# Patient Record
Sex: Female | Born: 1960
Health system: Southern US, Community
[De-identification: ages and names within clinical notes are randomized; demographics above are authoritative.]

## PROBLEM LIST (undated history)

## (undated) DIAGNOSIS — E041 Nontoxic single thyroid nodule: Secondary | ICD-10-CM

## (undated) DIAGNOSIS — M1990 Unspecified osteoarthritis, unspecified site: Secondary | ICD-10-CM

## (undated) DIAGNOSIS — F32A Depression, unspecified: Secondary | ICD-10-CM

## (undated) DIAGNOSIS — F329 Major depressive disorder, single episode, unspecified: Secondary | ICD-10-CM

## (undated) DIAGNOSIS — M069 Rheumatoid arthritis, unspecified: Secondary | ICD-10-CM

## (undated) HISTORY — PX: OTHER SURGICAL HISTORY: SHX169

## (undated) HISTORY — PX: TUBAL LIGATION: SHX77

## (undated) HISTORY — DX: Rheumatoid arthritis, unspecified: M06.9

## (undated) HISTORY — DX: Depression, unspecified: F32.A

## (undated) HISTORY — DX: Unspecified osteoarthritis, unspecified site: M19.90

## (undated) HISTORY — DX: Nontoxic single thyroid nodule: E04.1

## (undated) HISTORY — DX: Major depressive disorder, single episode, unspecified: F32.9

---

## 1998-12-23 ENCOUNTER — Emergency Department (HOSPITAL_COMMUNITY): Admission: EM | Admit: 1998-12-23 | Discharge: 1998-12-23 | Payer: Self-pay | Admitting: *Deleted

## 2002-08-03 ENCOUNTER — Emergency Department (HOSPITAL_COMMUNITY): Admission: EM | Admit: 2002-08-03 | Discharge: 2002-08-03 | Payer: Self-pay | Admitting: Emergency Medicine

## 2002-11-03 ENCOUNTER — Encounter: Payer: Self-pay | Admitting: Rheumatology

## 2002-11-03 ENCOUNTER — Encounter: Admission: RE | Admit: 2002-11-03 | Discharge: 2002-11-03 | Payer: Self-pay | Admitting: Rheumatology

## 2004-03-13 ENCOUNTER — Encounter: Admission: RE | Admit: 2004-03-13 | Discharge: 2004-03-13 | Payer: Self-pay | Admitting: Family Medicine

## 2004-08-24 ENCOUNTER — Ambulatory Visit: Payer: Self-pay | Admitting: Obstetrics and Gynecology

## 2005-04-01 ENCOUNTER — Encounter: Admission: RE | Admit: 2005-04-01 | Discharge: 2005-04-01 | Payer: Self-pay | Admitting: Family Medicine

## 2006-04-23 ENCOUNTER — Encounter: Admission: RE | Admit: 2006-04-23 | Discharge: 2006-04-23 | Payer: Self-pay | Admitting: Family Medicine

## 2006-10-19 ENCOUNTER — Emergency Department (HOSPITAL_COMMUNITY): Admission: EM | Admit: 2006-10-19 | Discharge: 2006-10-19 | Payer: Self-pay | Admitting: Emergency Medicine

## 2007-02-20 ENCOUNTER — Other Ambulatory Visit: Admission: RE | Admit: 2007-02-20 | Discharge: 2007-02-20 | Payer: Self-pay | Admitting: Family Medicine

## 2010-04-13 ENCOUNTER — Emergency Department (HOSPITAL_COMMUNITY): Payer: 59

## 2010-04-13 ENCOUNTER — Emergency Department (HOSPITAL_COMMUNITY)
Admission: EM | Admit: 2010-04-13 | Discharge: 2010-04-14 | Disposition: A | Discharge status: Home / Self Care | Payer: 59 | Attending: Emergency Medicine | Admitting: Emergency Medicine

## 2010-04-13 DIAGNOSIS — M069 Rheumatoid arthritis, unspecified: Secondary | ICD-10-CM | POA: Insufficient documentation

## 2010-04-13 DIAGNOSIS — R51 Headache: Secondary | ICD-10-CM | POA: Insufficient documentation

## 2010-04-13 DIAGNOSIS — H53149 Visual discomfort, unspecified: Secondary | ICD-10-CM | POA: Insufficient documentation

## 2010-04-13 DIAGNOSIS — M329 Systemic lupus erythematosus, unspecified: Secondary | ICD-10-CM | POA: Insufficient documentation

## 2010-07-14 ENCOUNTER — Emergency Department (INDEPENDENT_AMBULATORY_CARE_PROVIDER_SITE_OTHER): Payer: 59

## 2010-07-14 ENCOUNTER — Emergency Department (HOSPITAL_BASED_OUTPATIENT_CLINIC_OR_DEPARTMENT_OTHER)
Admission: EM | Admit: 2010-07-14 | Discharge: 2010-07-14 | Disposition: A | Discharge status: Home / Self Care | Payer: 59 | Attending: Emergency Medicine | Admitting: Emergency Medicine

## 2010-07-14 DIAGNOSIS — N83209 Unspecified ovarian cyst, unspecified side: Secondary | ICD-10-CM | POA: Insufficient documentation

## 2010-07-14 DIAGNOSIS — R1031 Right lower quadrant pain: Secondary | ICD-10-CM | POA: Insufficient documentation

## 2010-07-14 DIAGNOSIS — D259 Leiomyoma of uterus, unspecified: Secondary | ICD-10-CM | POA: Insufficient documentation

## 2010-07-14 DIAGNOSIS — Z8739 Personal history of other diseases of the musculoskeletal system and connective tissue: Secondary | ICD-10-CM | POA: Insufficient documentation

## 2010-07-14 DIAGNOSIS — R350 Frequency of micturition: Secondary | ICD-10-CM

## 2010-07-14 LAB — URINALYSIS, ROUTINE W REFLEX MICROSCOPIC
Glucose, UA: NEGATIVE mg/dL
Nitrite: NEGATIVE
Specific Gravity, Urine: 1.013 (ref 1.005–1.030)
pH: 7 (ref 5.0–8.0)

## 2010-07-15 LAB — URINE CULTURE
Colony Count: NO GROWTH
Culture  Setup Time: 201205052341

## 2010-07-23 ENCOUNTER — Ambulatory Visit (HOSPITAL_COMMUNITY)
Admission: RE | Admit: 2010-07-23 | Discharge: 2010-07-23 | Disposition: A | Discharge status: Home / Self Care | Payer: 59 | Source: Ambulatory Visit | Attending: Obstetrics and Gynecology | Admitting: Obstetrics and Gynecology

## 2010-07-23 ENCOUNTER — Other Ambulatory Visit (HOSPITAL_COMMUNITY): Payer: Self-pay | Admitting: Obstetrics and Gynecology

## 2010-07-23 ENCOUNTER — Other Ambulatory Visit (HOSPITAL_COMMUNITY): Payer: 59

## 2010-07-23 DIAGNOSIS — N83201 Unspecified ovarian cyst, right side: Secondary | ICD-10-CM

## 2010-07-23 DIAGNOSIS — N949 Unspecified condition associated with female genital organs and menstrual cycle: Secondary | ICD-10-CM | POA: Insufficient documentation

## 2010-07-23 DIAGNOSIS — N840 Polyp of corpus uteri: Secondary | ICD-10-CM | POA: Insufficient documentation

## 2010-07-23 DIAGNOSIS — D259 Leiomyoma of uterus, unspecified: Secondary | ICD-10-CM | POA: Insufficient documentation

## 2010-07-23 DIAGNOSIS — N83209 Unspecified ovarian cyst, unspecified side: Secondary | ICD-10-CM | POA: Insufficient documentation

## 2011-06-27 ENCOUNTER — Ambulatory Visit
Admission: RE | Admit: 2011-06-27 | Discharge: 2011-06-27 | Disposition: A | Discharge status: Home / Self Care | Payer: 59 | Source: Ambulatory Visit | Attending: Family Medicine | Admitting: Family Medicine

## 2011-06-27 ENCOUNTER — Other Ambulatory Visit: Payer: Self-pay | Admitting: Family Medicine

## 2011-06-27 DIAGNOSIS — R222 Localized swelling, mass and lump, trunk: Secondary | ICD-10-CM

## 2012-07-24 ENCOUNTER — Other Ambulatory Visit: Payer: Self-pay | Admitting: Family Medicine

## 2012-07-24 ENCOUNTER — Other Ambulatory Visit (HOSPITAL_COMMUNITY)
Admission: RE | Admit: 2012-07-24 | Discharge: 2012-07-24 | Disposition: A | Discharge status: Home / Self Care | Payer: 59 | Source: Ambulatory Visit | Attending: Family Medicine | Admitting: Family Medicine

## 2012-07-24 DIAGNOSIS — Z124 Encounter for screening for malignant neoplasm of cervix: Secondary | ICD-10-CM | POA: Insufficient documentation

## 2013-01-25 ENCOUNTER — Emergency Department (HOSPITAL_COMMUNITY)
Admission: EM | Admit: 2013-01-25 | Discharge: 2013-01-25 | Disposition: A | Discharge status: Home / Self Care | Payer: 59 | Attending: Emergency Medicine | Admitting: Emergency Medicine

## 2013-01-25 ENCOUNTER — Encounter (HOSPITAL_COMMUNITY): Payer: Self-pay | Admitting: Emergency Medicine

## 2013-01-25 ENCOUNTER — Emergency Department (HOSPITAL_COMMUNITY): Payer: 59

## 2013-01-25 DIAGNOSIS — F17200 Nicotine dependence, unspecified, uncomplicated: Secondary | ICD-10-CM | POA: Insufficient documentation

## 2013-01-25 DIAGNOSIS — M5412 Radiculopathy, cervical region: Secondary | ICD-10-CM

## 2013-01-25 DIAGNOSIS — Z79899 Other long term (current) drug therapy: Secondary | ICD-10-CM | POA: Insufficient documentation

## 2013-01-25 DIAGNOSIS — H538 Other visual disturbances: Secondary | ICD-10-CM | POA: Insufficient documentation

## 2013-01-25 DIAGNOSIS — M25519 Pain in unspecified shoulder: Secondary | ICD-10-CM | POA: Insufficient documentation

## 2013-01-25 LAB — CBC
HCT: 36.5 % (ref 36.0–46.0)
Hemoglobin: 12.7 g/dL (ref 12.0–15.0)
MCH: 31.7 pg (ref 26.0–34.0)
MCV: 91 fL (ref 78.0–100.0)
RDW: 13.5 % (ref 11.5–15.5)
WBC: 4 10*3/uL (ref 4.0–10.5)

## 2013-01-25 LAB — COMPREHENSIVE METABOLIC PANEL
Alkaline Phosphatase: 54 U/L (ref 39–117)
CO2: 24 mEq/L (ref 19–32)
Calcium: 9.5 mg/dL (ref 8.4–10.5)
Creatinine, Ser: 0.7 mg/dL (ref 0.50–1.10)
GFR calc Af Amer: >90 mL/min (ref >90)
GFR calc non Af Amer: >90 mL/min (ref >90)
Glucose, Bld: 82 mg/dL (ref 70–99)
Total Bilirubin: 0.3 mg/dL (ref 0.3–1.2)

## 2013-01-25 LAB — POCT I-STAT TROPONIN I: Troponin i, poc: 0.01 ng/mL (ref 0.00–0.08)

## 2013-01-25 LAB — DIFFERENTIAL
Basophils Absolute: 0.1 10*3/uL (ref 0.0–0.1)
Eosinophils Relative: 6 % — ABNORMAL HIGH (ref 0–5)
Lymphocytes Relative: 46 % (ref 12–46)
Lymphs Abs: 1.8 10*3/uL (ref 0.7–4.0)
Monocytes Absolute: 0.4 10*3/uL (ref 0.1–1.0)
Monocytes Relative: 9 % (ref 3–12)
Neutro Abs: 1.5 10*3/uL — ABNORMAL LOW (ref 1.7–7.7)

## 2013-01-25 LAB — APTT: aPTT: 36 seconds (ref 24–37)

## 2013-01-25 MED ORDER — HYDROCODONE-ACETAMINOPHEN 5-325 MG PO TABS: 1.0000 | ORAL_TABLET | ORAL | Status: DC | PRN

## 2013-01-25 MED ORDER — CYCLOBENZAPRINE HCL 10 MG PO TABS: 10.0000 mg | ORAL_TABLET | Freq: Two times a day (BID) | ORAL | Status: DC | PRN

## 2013-01-25 MED ORDER — PREDNISONE 20 MG PO TABS: ORAL_TABLET | ORAL | Status: DC

## 2013-01-25 NOTE — ED Provider Notes (Signed)
CSN: [id number]     Arrival date & time 01/25/13  1342 History   First MD Initiated Contact with Patient 01/25/13 1759     Chief Complaint  Patient presents with  . Numbness  . Visual Field Change   (Consider location/radiation/quality/duration/timing/severity/associated sxs/prior Treatment) HPI Comments: Patient presents to the ER for evaluation numbness, tingling in the left arm with pain in the neck and shoulder region. Patient reports that symptoms began 2 weeks ago, but in the last 2 days have worsened. Patient reports that symptoms are worse when she lies on her side. Pain, numbness and tingling worsens when she turns her head to the left. She denies any injury. She has not had chest pain or shortness of breath. Patient denies headache. She did notice that she has blurred vision today. Reports diffuse blurring of her vision without laterality. She has not noticed any symptoms in the legs.   History reviewed. No pertinent past medical history. Past Surgical History  Procedure Laterality Date  . Tubal ligation     No family history on file. History  Substance Use Topics  . Smoking status: Current Every Day Smoker  . Smokeless tobacco: Not on file  . Alcohol Use: Yes     Comment: occ   OB History   Grav Para Term Preterm Abortions TAB SAB Ect Mult Living                 Review of Systems  Eyes: Positive for visual disturbance.  Musculoskeletal: Positive for arthralgias and neck pain.  Neurological: Positive for numbness. Negative for dizziness, weakness and headaches.  All other systems reviewed and are negative.    Allergies  Review of patient's allergies indicates no known allergies.  Home Medications   Current Outpatient Rx  Name  Route  Sig  Dispense  Refill  . B Complex CAPS   Oral   Take 1 capsule by mouth daily.         . celecoxib (CELEBREX) 200 MG capsule   Oral   Take 200 mg by mouth 2 (two) times daily as needed for mild pain.         .  Cholecalciferol (VITAMIN D PO)   Oral   Take 1 tablet by mouth daily.         Marland Kitchen leflunomide (ARAVA) 20 MG tablet   Oral   Take 20 mg by mouth daily.         . Multiple Vitamin (MULTIVITAMIN WITH MINERALS) TABS tablet   Oral   Take 1 tablet by mouth daily.         Marland Kitchen sulfaSALAzine (AZULFIDINE) 500 MG tablet   Oral   Take 1,000 mg by mouth 2 (two) times daily.         . cyclobenzaprine (FLEXERIL) 10 MG tablet   Oral   Take 1 tablet (10 mg total) by mouth 2 (two) times daily as needed for muscle spasms.   20 tablet   0   . HYDROcodone-acetaminophen (NORCO/VICODIN) 5-325 MG per tablet   Oral   Take 1-2 tablets by mouth every 4 (four) hours as needed.   20 tablet   0   . predniSONE (DELTASONE) 20 MG tablet      3 tabs po daily x 3 days, then 2 tabs x 3 days, then 1.5 tabs x 3 days, then 1 tab x 3 days, then 0.5 tabs x 3 days   27 tablet   0    BP 135/89  Pulse 62  Temp(Src) 97.8 F (36.6 C) (Oral)  Resp 16  Wt 175 lb (79.379 kg)  SpO2 100% Physical Exam  Constitutional: She is oriented to person, place, and time. She appears well-developed and well-nourished. No distress.  HENT:  Head: Normocephalic and atraumatic.  Right Ear: Hearing normal.  Left Ear: Hearing normal.  Nose: Nose normal.  Mouth/Throat: Oropharynx is clear and moist and mucous membranes are normal.  Eyes: Conjunctivae and EOM are normal. Pupils are equal, round, and reactive to light.  Neck: Normal range of motion. Neck supple.  Cardiovascular: Regular rhythm, S1 normal and S2 normal.  Exam reveals no gallop and no friction rub.   No murmur heard. Pulmonary/Chest: Effort normal and breath sounds normal. No respiratory distress. She exhibits no tenderness.  Abdominal: Soft. Normal appearance and bowel sounds are normal. There is no hepatosplenomegaly. There is no tenderness. There is no rebound, no guarding, no tenderness at McBurney's point and negative Murphy's sign. No hernia.    Musculoskeletal: Normal range of motion.       Cervical back: She exhibits tenderness. She exhibits no bony tenderness.       Back:  Left-sided paraspinal cervical muscle tenderness with increased symptoms when turning head to the left  Neurological: She is alert and oriented to person, place, and time. She has normal strength. No cranial nerve deficit or sensory deficit. Coordination normal. GCS eye subscore is 4. GCS verbal subscore is 5. GCS motor subscore is 6.  Reflex Scores:      Tricep reflexes are 2+ on the right side and 2+ on the left side.      Bicep reflexes are 2+ on the right side and 2+ on the left side.      Brachioradialis reflexes are 2+ on the right side and 2+ on the left side. Normal finger to nose.  Skin: Skin is warm, dry and intact. No rash noted. No cyanosis.  Psychiatric: She has a normal mood and affect. Her speech is normal and behavior is normal. Thought content normal.    ED Course  Procedures (including critical care time) Labs Review Labs Reviewed  DIFFERENTIAL - Abnormal; Notable for the following:    Neutrophils Relative % 38 (*)    Neutro Abs 1.5 (*)    Eosinophils Relative 6 (*)    All other components within normal limits  PROTIME-INR  APTT  CBC  COMPREHENSIVE METABOLIC PANEL  TROPONIN I  POCT I-STAT TROPONIN I   Imaging Review Ct Head (brain) Wo Contrast  01/25/2013   CLINICAL DATA:  Numbness in the left arm.  Blurred vision.  EXAM: CT HEAD WITHOUT CONTRAST  TECHNIQUE: Contiguous axial images were obtained from the base of the skull through the vertex without intravenous contrast.  COMPARISON:  04/13/2010  FINDINGS: Normal ventricles. No parenchymal masses or mass effect. No areas of abnormal parenchymal attenuation.  No extra-axial masses or abnormal fluid collections.  No intracranial hemorrhage.  No skull lesion. Visualized sinuses and mastoid air cells are clear.  IMPRESSION: Normal unenhanced CT scan of brain.   Electronically Signed    By: Amie Portland M.D.   On: 01/25/2013 15:09    EKG Interpretation    Date/Time:  Monday January 25 2013 13:47:37 EST Ventricular Rate:  66 PR Interval:  184 QRS Duration: 84 QT Interval:  418 QTC Calculation: 438 R Axis:   87 Text Interpretation:  Normal sinus rhythm Normal ECG            MDM  1. Cervical radiculopathy    Patient presents to the ER for evaluation of pain in the left neck and shoulder area with numbness and tingling in the left arm. Patient has normal neurologic function. She has normal sensation, motor function. Reflexes are normal. Symptoms are reproducible when she turns her head to the left. She has slight increased pain with movement of the left shoulder as well. Symptoms are consistent with cervical radiculopathy. Head CT is unremarkable. Cardiac workup also was performed and is negative. There is no concern for acute coronary syndrome based on her presentation and workup. Likewise, no concern for a central nervous system abnormality or stroke.  Patient does not have any deficit which would warrant emergent MRI. She'll be treated symptomatically for cervical radiculopathy, will schedule followup with her doctor in one week for further evaluation. She was given return instructions.  The patient is alert clear etiology. She does not have any focality or unilaterality. There is no field cut. Patient reports that her blurring has progressively improved with a course of the day. She does not have any evidence of hyperglycemia and no history of diabetes.    Gilda Crease, MD 01/25/13 [id number]

## 2013-01-25 NOTE — ED Notes (Signed)
Pt states on Friday started having numbness to left arm and and top of her feet are burning.  Pt states woke up this am with blurred vision out of both eyes.  Pt denies diabetes.

## 2013-01-25 NOTE — ED Notes (Signed)
Pt brought to POD E at 17:00.  St"s she has been having pain and numbness in left arm.  Pt alert and oriented x's 3.

## 2015-09-04 ENCOUNTER — Other Ambulatory Visit (HOSPITAL_COMMUNITY)
Admission: RE | Admit: 2015-09-04 | Discharge: 2015-09-04 | Disposition: A | Discharge status: Home / Self Care | Payer: 59 | Source: Ambulatory Visit | Attending: Family Medicine | Admitting: Family Medicine

## 2015-09-04 ENCOUNTER — Other Ambulatory Visit: Payer: Self-pay | Admitting: Family Medicine

## 2015-09-04 DIAGNOSIS — Z124 Encounter for screening for malignant neoplasm of cervix: Secondary | ICD-10-CM | POA: Insufficient documentation

## 2015-09-06 LAB — CYTOLOGY - PAP

## 2016-02-06 DIAGNOSIS — Z8619 Personal history of other infectious and parasitic diseases: Secondary | ICD-10-CM | POA: Insufficient documentation

## 2016-02-06 DIAGNOSIS — Z79899 Other long term (current) drug therapy: Secondary | ICD-10-CM | POA: Insufficient documentation

## 2016-02-06 DIAGNOSIS — M0579 Rheumatoid arthritis with rheumatoid factor of multiple sites without organ or systems involvement: Secondary | ICD-10-CM | POA: Insufficient documentation

## 2016-02-06 DIAGNOSIS — R7611 Nonspecific reaction to tuberculin skin test without active tuberculosis: Secondary | ICD-10-CM | POA: Insufficient documentation

## 2016-02-06 DIAGNOSIS — M19041 Primary osteoarthritis, right hand: Secondary | ICD-10-CM | POA: Insufficient documentation

## 2016-02-06 DIAGNOSIS — M170 Bilateral primary osteoarthritis of knee: Secondary | ICD-10-CM | POA: Insufficient documentation

## 2016-02-06 DIAGNOSIS — M0630 Rheumatoid nodule, unspecified site: Secondary | ICD-10-CM | POA: Insufficient documentation

## 2016-02-06 DIAGNOSIS — L930 Discoid lupus erythematosus: Secondary | ICD-10-CM | POA: Insufficient documentation

## 2016-02-06 DIAGNOSIS — M19042 Primary osteoarthritis, left hand: Secondary | ICD-10-CM | POA: Insufficient documentation

## 2016-02-06 NOTE — Progress Notes (Signed)
Office Visit Note  Patient: Samantha Hughes             Date of Birth: [birth year]           MRN: [id number]             PCP: Osborne Casco, MD Referring: Kelton Pillar, MD Visit Date: 02/07/2016 Occupation: Material planner and buyer    Subjective:  Right foot pain   History of Present Illness: Samantha Hughes is a 55 y.o.right handed female with sero positive rheumatoid arthritis. She states she's been doing fairly well except she is occasional discomfort in her right first MTP joint of her foot. There is no history of joint swelling only discomfort in her right first MTP joint which is not described as severe. She uses topical Voltaren gel which helps it.   Activities of Daily Living:  Patient reports morning stiffness for 0 minute.   Patient Denies nocturnal pain.  Difficulty dressing/grooming: Denies Difficulty climbing stairs: Denies Difficulty getting out of chair: Denies Difficulty using hands for taps, buttons, cutlery, and/or writing: Denies   Review of Systems  Constitutional: Negative for fatigue, night sweats, weight gain, weight loss and weakness.  HENT: Negative for mouth sores, trouble swallowing, trouble swallowing, mouth dryness and nose dryness.   Eyes: Positive for dryness. Negative for pain, redness and visual disturbance.  Respiratory: Negative for cough, shortness of breath and difficulty breathing.   Cardiovascular: Negative for chest pain, palpitations, hypertension, irregular heartbeat and swelling in legs/feet.  Gastrointestinal: Positive for constipation. Negative for blood in stool and diarrhea.  Endocrine: Negative for increased urination.  Genitourinary: Negative for vaginal dryness.  Musculoskeletal: Positive for arthralgias and joint pain. Negative for joint swelling, myalgias, muscle weakness, morning stiffness, muscle tenderness and myalgias.  Skin: Negative for color change, rash, hair loss, skin tightness, ulcers and sensitivity to  sunlight.  Allergic/Immunologic: Negative for susceptible to infections.  Neurological: Negative for dizziness, memory loss and night sweats.  Hematological: Negative for swollen glands.  Psychiatric/Behavioral: Negative for depressed mood and sleep disturbance. The patient is not nervous/anxious.     PMFS History:  Patient Active Problem List   Diagnosis Date Noted  . Rheumatoid arthritis with rheumatoid factor of multiple sites without organ or systems involvement ([place]) 02/06/2016  . Discoid lupus 02/06/2016  . Primary osteoarthritis of both hands 02/06/2016  . Primary osteoarthritis of both knees 02/06/2016  . Rheumatoid nodulosis ([place]) 02/06/2016  . High risk medication use 02/06/2016  . Positive PPD, treated 02/06/2016    Past Medical History:  Diagnosis Date  . Depression   . Osteoarthritis   . Rheumatoid arthritis ([place])     Family History  Problem Relation Age of Onset  . Diabetes Mother   . Hypertension Mother   . Diabetes Sister   . Diabetes Maternal Grandmother   . Cancer Maternal Grandfather    Past Surgical History:  Procedure Laterality Date  . eye lift    . TUBAL LIGATION     Social History   Social History Narrative  . No narrative on file     Objective: Vital Signs: BP 112/78 (BP Location: Left Arm, Patient Position: Sitting, Cuff Size: Large)   Pulse 80   Ht _0  (1.676 m)   Wt 177 lb (80.3 kg)   BMI 28.57 kg/m    Physical Exam  Constitutional: She is oriented to person, place, and time. She appears well-developed and well-nourished.  HENT:  Head: Normocephalic and atraumatic.  Eyes: Conjunctivae  and EOM are normal.  Neck: Normal range of motion.  Cardiovascular: Normal rate, regular rhythm, normal heart sounds and intact distal pulses.   Pulmonary/Chest: Effort normal and breath sounds normal.  Abdominal: Soft. Bowel sounds are normal.  Lymphadenopathy:    She has no cervical adenopathy.  Neurological: She is alert and oriented to  person, place, and time.  Skin: Skin is warm and dry. Capillary refill takes less than 2 seconds.  A small palpable nodule over her left elbow  Psychiatric: She has a normal mood and affect. Her behavior is normal.  Nursing note and vitals reviewed.    Musculoskeletal Exam: C-spine, thoracic, lumbar spine good range of motion, shoulder joints, wrist joints, elbow joints, MCPs PIPs DIPs with good range of motion with no synovitis. Hip joints, knee joints, ankle joints, MTPs were good range of motion with no synovitis. Fibromyalgia tender points all absent.  CDAI Exam: CDAI Homunculus Exam:   Joint Counts:  CDAI Tender Joint count: 0 CDAI Swollen Joint count: 0  Global Assessments:  Patient Global Assessment: 2 Provider Global Assessment: 2  CDAI Calculated Score: 4    Investigation: Findings:  December 2003: rheumatoid factor 138, positive CCP. , Hepatitis panel negative, HLA-B27 negative   Positive ANA 1:160 homogeneous.      CBC and CMP 12/12/15      Imaging: No results found.  Speciality Comments: No specialty comments available.    Procedures:  No procedures performed Allergies: Arava [leflunomide]; Plaquenil [hydroxychloroquine sulfate]; and Methotrexate derivatives   Assessment / Plan:     Visit Diagnoses: Primary osteoarthritis of both hands  Rheumatoid arthritis with rheumatoid factor of multiple sites without organ or systems involvement ([place]) - RA.  Positive rheumatoid factor, CCP and history of nodulosis.  She still have a small nodule palpable over her left elbow. She is no synovitis on examination she is doing very well on sulfasalazine which she is tolerating well.  Rheumatoid nodulosis (HCC)  High risk medication use -on Azulfidine EN 500 mg 2 tablets by mouth twice a day. We'll check labs today and then every 3 months to monitor for drug toxicity. Plan: CBC with Differential/Platelet, COMPLETE METABOLIC PANEL WITH GFR, CBC with Differential/Platelet,  COMPLETE METABOLIC PANEL WITH GFR. I will refill her medications today.  Positive PPD, treated - With INH in the past  Discoid lupus - Positive ANA, and she is not have any active rash in a long time.  Primary osteoarthritis of both knees: She is minimal discomfort weight loss diet and exercise was discussed.  Smoker : Smoking cessation was discussed.  Association of rheumatoid arthritis with increased risk of heart disease was discussed. I've advised her to maintain her blood pressure, cholesterol, ideal weight. Need for regular exercise was also emphasized.   Orders: Orders Placed This Encounter  Procedures  . CBC with Differential/Platelet  . COMPLETE METABOLIC PANEL WITH GFR  . CBC with Differential/Platelet  . COMPLETE METABOLIC PANEL WITH GFR   Meds ordered this encounter  Medications  . sulfaSALAzine (AZULFIDINE) 500 MG EC tablet    Sig: Take 2 tablets (1,000 mg total) by mouth 2 (two) times daily.    Dispense:  360 tablet    Refill:  0    Face-to-face time spent with patient was 30 minutes. 50% of time was spent in counseling and coordination of care.  Follow-Up Instructions: Return in about 5 months (around 07/07/2016) for Rheumatoid arthritis.   Bo Merino, MD

## 2016-02-07 ENCOUNTER — Ambulatory Visit (INDEPENDENT_AMBULATORY_CARE_PROVIDER_SITE_OTHER): Payer: 59 | Admitting: Rheumatology

## 2016-02-07 ENCOUNTER — Encounter: Payer: Self-pay | Admitting: Rheumatology

## 2016-02-07 ENCOUNTER — Other Ambulatory Visit: Payer: Self-pay | Admitting: Radiology

## 2016-02-07 VITALS — BP 112/78 | HR 80 | Ht 66.0 in | Wt 177.0 lb

## 2016-02-07 DIAGNOSIS — Z79899 Other long term (current) drug therapy: Secondary | ICD-10-CM

## 2016-02-07 DIAGNOSIS — M0579 Rheumatoid arthritis with rheumatoid factor of multiple sites without organ or systems involvement: Secondary | ICD-10-CM | POA: Diagnosis not present

## 2016-02-07 DIAGNOSIS — M170 Bilateral primary osteoarthritis of knee: Secondary | ICD-10-CM | POA: Diagnosis not present

## 2016-02-07 DIAGNOSIS — F17200 Nicotine dependence, unspecified, uncomplicated: Secondary | ICD-10-CM

## 2016-02-07 DIAGNOSIS — M19041 Primary osteoarthritis, right hand: Secondary | ICD-10-CM

## 2016-02-07 DIAGNOSIS — R7611 Nonspecific reaction to tuberculin skin test without active tuberculosis: Secondary | ICD-10-CM | POA: Diagnosis not present

## 2016-02-07 DIAGNOSIS — M19042 Primary osteoarthritis, left hand: Secondary | ICD-10-CM

## 2016-02-07 DIAGNOSIS — M0630 Rheumatoid nodule, unspecified site: Secondary | ICD-10-CM

## 2016-02-07 DIAGNOSIS — L930 Discoid lupus erythematosus: Secondary | ICD-10-CM | POA: Diagnosis not present

## 2016-02-07 LAB — CBC WITH DIFFERENTIAL/PLATELET
BASOS ABS: 44 {cells}/uL (ref 0–200)
Basophils Relative: 1 %
EOS PCT: 9 %
Eosinophils Absolute: 396 cells/uL (ref 15–500)
HEMATOCRIT: 40 % (ref 35.0–45.0)
Hemoglobin: 12.6 g/dL (ref 11.7–15.5)
LYMPHS ABS: 1760 {cells}/uL (ref 850–3900)
Lymphocytes Relative: 40 %
MCH: 30.2 pg (ref 27.0–33.0)
MCHC: 31.5 g/dL — AB (ref 32.0–36.0)
MCV: 95.9 fL (ref 80.0–100.0)
MONO ABS: 396 {cells}/uL (ref 200–950)
MPV: 10.7 fL (ref 7.5–12.5)
Monocytes Relative: 9 %
NEUTROS ABS: 1804 {cells}/uL (ref 1500–7800)
Neutrophils Relative %: 41 %
Platelets: 229 10*3/uL (ref 140–400)
RBC: 4.17 MIL/uL (ref 3.80–5.10)
RDW: 14.1 % (ref 11.0–15.0)
WBC: 4.4 10*3/uL (ref 3.8–10.8)

## 2016-02-07 LAB — COMPLETE METABOLIC PANEL WITH GFR
ALBUMIN: 4 g/dL (ref 3.6–5.1)
ALK PHOS: 51 U/L (ref 33–130)
ALT: 9 U/L (ref 6–29)
AST: 17 U/L (ref 10–35)
BUN: 15 mg/dL (ref 7–25)
CALCIUM: 9.4 mg/dL (ref 8.6–10.4)
CO2: 29 mmol/L (ref 20–31)
Chloride: 108 mmol/L (ref 98–110)
Creat: 0.89 mg/dL (ref 0.50–1.05)
GFR, Est African American: 85 mL/min (ref >=60)
GFR, Est Non African American: 74 mL/min (ref >=60)
Glucose, Bld: 54 mg/dL — ABNORMAL LOW (ref 65–99)
POTASSIUM: 3.8 mmol/L (ref 3.5–5.3)
Sodium: 142 mmol/L (ref 135–146)
Total Bilirubin: 0.4 mg/dL (ref 0.2–1.2)
Total Protein: 6.8 g/dL (ref 6.1–8.1)

## 2016-02-07 MED ORDER — SULFASALAZINE 500 MG PO TBEC: 1000.0000 mg | DELAYED_RELEASE_TABLET | Freq: Two times a day (BID) | ORAL | 0 refills | Status: DC

## 2016-02-07 NOTE — Progress Notes (Signed)
Rheumatology Medication Review by a Pharmacist Does the patient feel that his/her medications are working for him/her?  Yes Has the patient been experiencing any side effects to the medications prescribed?  No Does the patient have any problems obtaining medications?  No  Issues to address at subsequent visits: None   Pharmacist comments:  Samantha Hughes is a pleasant 55 yo F who presents for follow up of her rheumatoid arthritis.  Patient is currently taking sulfasalazine DR 1000mg  twice daily.  She had most recent standing labs on 12/11/15 which were normal.  She will be due for standing labs again in January 2018.  Will place standing lab orders.  Patient denies any medication related questions at this time.    Elisabeth Most, Pharm.D., BCPS Clinical Pharmacist Pager: [phone] Phone: [phone] 02/07/2016 8:33 AM

## 2016-02-07 NOTE — Patient Instructions (Signed)
Standing Labs We placed an order today for your standing lab work.    Please come back and get your standing labs in January 2018 and every 3 months.    We have open lab Monday through Friday from 8:30-11:30 AM and 1-4 PM at the office of Dr. Shaili Deveshwar/Naitik Panwala, PA.   The office is located at [address], [address], [place], [place] [address] No appointment is necessary.   Labs are drawn by [facility].  You may receive a bill from [facility] for your lab work.     

## 2016-02-08 NOTE — Progress Notes (Signed)
Normal labs.

## 2016-02-21 ENCOUNTER — Other Ambulatory Visit: Payer: Self-pay | Admitting: Rheumatology

## 2016-02-21 NOTE — Telephone Encounter (Signed)
ok 

## 2016-02-21 NOTE — Telephone Encounter (Signed)
Last Visit: 02/07/16 Next Visit: 07/08/16 Labs: 02/07/16 WNL  Okay to refill Celebrex?

## 2016-06-14 DIAGNOSIS — J069 Acute upper respiratory infection, unspecified: Secondary | ICD-10-CM | POA: Diagnosis not present

## 2016-06-27 DIAGNOSIS — L658 Other specified nonscarring hair loss: Secondary | ICD-10-CM | POA: Insufficient documentation

## 2016-07-08 ENCOUNTER — Ambulatory Visit: Payer: 59 | Admitting: Rheumatology

## 2016-07-08 DIAGNOSIS — F17200 Nicotine dependence, unspecified, uncomplicated: Secondary | ICD-10-CM | POA: Insufficient documentation

## 2016-07-08 NOTE — Progress Notes (Signed)
Office Visit Note  Patient: Samantha Hughes             Date of Birth: 1961/02/12           MRN: [id number]             PCP: Osborne Casco, MD Referring: Kelton Pillar, MD Visit Date: 07/12/2016 Occupation: @GUAROCC @    Subjective:  Bilateral knee pain   History of Present Illness: Samantha Hughes is a 56 y.o. female with a history of rheumatoid arthritis, osteoarthritis, and discoid rash.  Patient states she is having knee pain.  Patient states she has been taking sulfasalazine daily and does not need refills. Patient denies joint swelling.  Denies rashes.  She states she occasionally has mouth ulcers.  Patient states that she had flare 6 days ago and lasted 3 days and has resolved. She describes during the time she had pain across her shoulders and lower back. She mostly rested and the symptoms resolved.  She states her lower back and knees were painful.  She also reports muscle aches and stiffness.             Activities of Daily Living:  Patient reports morning stiffness for 2 minutes.   Patient Denies nocturnal pain.  Difficulty dressing/grooming: Denies Difficulty climbing stairs: Denies Difficulty getting out of chair: Denies Difficulty using hands for taps, buttons, cutlery, and/or writing: Denies   Review of Systems  Constitutional: Negative for fatigue, weight gain, weight loss and weakness.  HENT: Positive for mouth sores. Negative for mouth dryness and nose dryness.   Eyes: Positive for dryness.  Respiratory: Negative for cough, shortness of breath and difficulty breathing.   Cardiovascular: Negative for chest pain, palpitations, hypertension, irregular heartbeat and swelling in legs/feet.  Gastrointestinal: Positive for constipation. Negative for diarrhea.  Genitourinary: Negative for painful urination.  Musculoskeletal: Positive for arthralgias, joint pain, myalgias and myalgias. Negative for joint swelling and morning stiffness.  Skin: Negative for color  change, rash, nodules/bumps, redness, ulcers and sensitivity to sunlight.  Psychiatric/Behavioral: Negative for depressed mood and sleep disturbance. The patient is not nervous/anxious.     PMFS History:  Patient Active Problem List   Diagnosis Date Noted  . Smoker 07/08/2016  . Rheumatoid arthritis with rheumatoid factor of multiple sites without organ or systems involvement ([place]) 02/06/2016  . Discoid lupus 02/06/2016  . Primary osteoarthritis of both hands 02/06/2016  . Primary osteoarthritis of both knees 02/06/2016  . Rheumatoid nodulosis ([place]) 02/06/2016  . High risk medication use 02/06/2016  . Positive PPD, treated 02/06/2016    Past Medical History:  Diagnosis Date  . Depression   . Osteoarthritis   . Rheumatoid arthritis ([place])     Family History  Problem Relation Age of Onset  . Diabetes Mother   . Hypertension Mother   . Diabetes Sister   . Diabetes Maternal Grandmother   . Cancer Maternal Grandfather    Past Surgical History:  Procedure Laterality Date  . eye lift    . TUBAL LIGATION     Social History   Social History Narrative  . No narrative on file     Objective: Vital Signs: BP 137/84 (BP Location: Left Arm, Patient Position: Sitting, Cuff Size: Normal)   Pulse 80   Resp 12   Ht 5\' 6"  (1.676 m)   Wt 177 lb (80.3 kg)   BMI 28.57 kg/m    Physical Exam  Constitutional: She is oriented to person, place, and time. She appears well-developed  and well-nourished.  HENT:  Head: Normocephalic and atraumatic.  Eyes: Conjunctivae and EOM are normal.  Neck: Normal range of motion.  Cardiovascular: Normal rate, regular rhythm, normal heart sounds and intact distal pulses.   Pulmonary/Chest: Effort normal and breath sounds normal.  Abdominal: Soft. Bowel sounds are normal.  Lymphadenopathy:    She has no cervical adenopathy.  Neurological: She is alert and oriented to person, place, and time.  Skin: Skin is warm and dry. Capillary refill takes less  than 2 seconds.  Psychiatric: She has a normal mood and affect. Her behavior is normal.  Nursing note and vitals reviewed.    Musculoskeletal Exam: C-spine and thoracic lumbar spine good range of motion. Shoulder joints elbow joints wrist joint MCPs PIPs DIPs with good range of motion. She is some synovial thickening over bilateral second MCP joint. Hip joints knee joints ankles MTPs PIPs with good range of motion with no synovitis.  CDAI Exam: CDAI Homunculus Exam:   Joint Counts:  CDAI Tender Joint count: 0 CDAI Swollen Joint count: 0  Global Assessments:  Patient Global Assessment: 3 Provider Global Assessment: 2  CDAI Calculated Score: 5    Investigation: Findings:  02/07/2016 CBC normal, CMP normal    Imaging: No results found.  Speciality Comments: No specialty comments available.    Procedures:  No procedures performed Allergies: Arava [leflunomide]; Plaquenil [hydroxychloroquine sulfate]; and Methotrexate derivatives   Assessment / Plan:     Visit Diagnoses: Rheumatoid arthritis with rheumatoid factor of multiple sites without organ or systems involvement (HCC) - Positive RF, positive anti-CCP. She had no synovitis on examination she has mild synovial thickening over bilateral second MCP joint. She seems to be doing quite well on sulfasalazine.  Rheumatoid nodulosis (HCC)  High risk medication use - Azulfidine EN 500 mg 2 tablets by mouth twice a day, labs are due. We will check her labs today and then every 3 months to monitor for drug toxicity  Discoid lupus - History of positive ANA. She has not had a  discoid rash and a long time.  Primary osteoarthritis of both hands she had some osteoarthritic changes and a small mucinous cyst over her right first PIP joint. We will observe it for right now.  Primary osteoarthritis of both knees: She does have chronic pain in her knee joints due to underlying osteoarthritis knee joint and muscle strengthening exercises  and weight management was discussed.  Positive PPD, treated - With INH  Smoker    Orders: Orders Placed This Encounter  Procedures  . CBC with Differential/Platelet  . COMPLETE METABOLIC PANEL WITH GFR  . CBC with Differential/Platelet  . COMPLETE METABOLIC PANEL WITH GFR   No orders of the defined types were placed in this encounter.   Face-to-face time spent with patient was 30 minutes. 50% of time was spent in counseling and coordination of care.  Follow-Up Instructions: Return in about 5 months (around 12/12/2016) for Rheumatoid arthritis.   Bo Merino, MD  Note - This record has been created using Editor, commissioning.  Chart creation errors have been sought, but may not always  have been located. Such creation errors do not reflect on  the standard of medical care.

## 2016-07-12 ENCOUNTER — Ambulatory Visit (INDEPENDENT_AMBULATORY_CARE_PROVIDER_SITE_OTHER): Payer: 59 | Admitting: Rheumatology

## 2016-07-12 ENCOUNTER — Encounter: Payer: Self-pay | Admitting: Rheumatology

## 2016-07-12 VITALS — BP 137/84 | HR 80 | Resp 12 | Ht 66.0 in | Wt 177.0 lb

## 2016-07-12 DIAGNOSIS — Z79899 Other long term (current) drug therapy: Secondary | ICD-10-CM

## 2016-07-12 DIAGNOSIS — M0579 Rheumatoid arthritis with rheumatoid factor of multiple sites without organ or systems involvement: Secondary | ICD-10-CM

## 2016-07-12 DIAGNOSIS — M19041 Primary osteoarthritis, right hand: Secondary | ICD-10-CM | POA: Diagnosis not present

## 2016-07-12 DIAGNOSIS — M0630 Rheumatoid nodule, unspecified site: Secondary | ICD-10-CM | POA: Diagnosis not present

## 2016-07-12 DIAGNOSIS — F17200 Nicotine dependence, unspecified, uncomplicated: Secondary | ICD-10-CM | POA: Diagnosis not present

## 2016-07-12 DIAGNOSIS — R7611 Nonspecific reaction to tuberculin skin test without active tuberculosis: Secondary | ICD-10-CM

## 2016-07-12 DIAGNOSIS — M170 Bilateral primary osteoarthritis of knee: Secondary | ICD-10-CM | POA: Diagnosis not present

## 2016-07-12 DIAGNOSIS — L930 Discoid lupus erythematosus: Secondary | ICD-10-CM

## 2016-07-12 DIAGNOSIS — M19042 Primary osteoarthritis, left hand: Secondary | ICD-10-CM | POA: Diagnosis not present

## 2016-07-12 LAB — CBC WITH DIFFERENTIAL/PLATELET
Basophils Absolute: 0 cells/uL (ref 0–200)
Basophils Relative: 0 %
Eosinophils Absolute: 364 cells/uL (ref 15–500)
Eosinophils Relative: 7 %
HCT: 38.4 % (ref 35.0–45.0)
Hemoglobin: 12.6 g/dL (ref 11.7–15.5)
Lymphocytes Relative: 39 %
Lymphs Abs: 2028 cells/uL (ref 850–3900)
MCH: 30.6 pg (ref 27.0–33.0)
MCHC: 32.8 g/dL (ref 32.0–36.0)
MCV: 93.2 fL (ref 80.0–100.0)
MPV: 10.6 fL (ref 7.5–12.5)
Monocytes Absolute: 416 cells/uL (ref 200–950)
Monocytes Relative: 8 %
Neutro Abs: 2392 cells/uL (ref 1500–7800)
Neutrophils Relative %: 46 %
Platelets: 250 10*3/uL (ref 140–400)
RBC: 4.12 MIL/uL (ref 3.80–5.10)
RDW: 14.1 % (ref 11.0–15.0)
WBC: 5.2 10*3/uL (ref 3.8–10.8)

## 2016-07-12 NOTE — Patient Instructions (Signed)
Standing Labs We placed an order today for your standing lab work.    Please come back and get your standing labs in August and every 3 months  We have open lab Monday through Friday from 8:30-11:30 AM and 1:30-4 PM at the office of Dr. Tresa Moore, PA.   The office is located at [address] [place], [place], [place] [address] No appointment is necessary.   Labs are drawn by [facility].  You may receive a bill from [place] for your lab work.

## 2016-07-13 LAB — COMPLETE METABOLIC PANEL WITH GFR
ALT: 12 U/L (ref 6–29)
AST: 18 U/L (ref 10–35)
Albumin: 4 g/dL (ref 3.6–5.1)
Alkaline Phosphatase: 48 U/L (ref 33–130)
BILIRUBIN TOTAL: 0.4 mg/dL (ref 0.2–1.2)
BUN: 15 mg/dL (ref 7–25)
CO2: 28 mmol/L (ref 20–31)
CREATININE: 0.75 mg/dL (ref 0.50–1.05)
Calcium: 9.6 mg/dL (ref 8.6–10.4)
Chloride: 106 mmol/L (ref 98–110)
GFR, Est African American: >89 mL/min (ref >=60)
GFR, Est Non African American: >89 mL/min (ref >=60)
Glucose, Bld: 38 mg/dL — CL (ref 65–99)
Potassium: 5.2 mmol/L (ref 3.5–5.3)
Sodium: 141 mmol/L (ref 135–146)
TOTAL PROTEIN: 6.7 g/dL (ref 6.1–8.1)

## 2016-07-13 NOTE — Progress Notes (Signed)
I spoke with lab most likely an error. Pt is asymptomatic.

## 2016-07-15 ENCOUNTER — Telehealth: Payer: Self-pay | Admitting: Radiology

## 2016-07-15 NOTE — Telephone Encounter (Signed)
I have called patient to advise labs are normal EXCEPT low glucose, have left message for her, this likely is lab error, if she has symptoms of low blood sugar, she should see PCP immediately

## 2016-07-15 NOTE — Telephone Encounter (Signed)
-----   Message from Bo Merino, MD sent at 07/13/2016  9:00 PM EDT ----- I spoke with lab most likely an error. Pt is asymptomatic.

## 2016-07-17 NOTE — Telephone Encounter (Signed)
Attempted to contact the patient and left message for patient to call the office.  

## 2016-07-17 NOTE — Telephone Encounter (Signed)
Spoke with patient and advised her of lab results. Patient states she has been feeling dizzy and light headed. Patient advised she should follow up with PCP as soon as possible. Copy of labs sent to PCP.  Patient states she will call to make an appointment.

## 2016-07-18 DIAGNOSIS — H52223 Regular astigmatism, bilateral: Secondary | ICD-10-CM | POA: Diagnosis not present

## 2016-09-04 ENCOUNTER — Other Ambulatory Visit: Payer: Self-pay | Admitting: Rheumatology

## 2016-09-04 NOTE — Telephone Encounter (Signed)
Last Visit: 07/12/16 Next Visit: 12/13/16 Labs: 07/12/16 normal labs except low glucose.   Okay to refill per Dr. Estanislado Pandy

## 2016-09-18 DIAGNOSIS — Z0000 Encounter for general adult medical examination without abnormal findings: Secondary | ICD-10-CM | POA: Diagnosis not present

## 2016-09-18 DIAGNOSIS — M069 Rheumatoid arthritis, unspecified: Secondary | ICD-10-CM | POA: Diagnosis not present

## 2016-09-18 DIAGNOSIS — N951 Menopausal and female climacteric states: Secondary | ICD-10-CM | POA: Diagnosis not present

## 2016-09-21 DIAGNOSIS — L02412 Cutaneous abscess of left axilla: Secondary | ICD-10-CM | POA: Diagnosis not present

## 2016-09-26 DIAGNOSIS — L658 Other specified nonscarring hair loss: Secondary | ICD-10-CM | POA: Diagnosis not present

## 2016-10-07 ENCOUNTER — Other Ambulatory Visit: Payer: Self-pay | Admitting: *Deleted

## 2016-10-07 DIAGNOSIS — Z79899 Other long term (current) drug therapy: Secondary | ICD-10-CM | POA: Diagnosis not present

## 2016-10-07 LAB — CBC WITH DIFFERENTIAL/PLATELET
BASOS ABS: 50 {cells}/uL (ref 0–200)
Basophils Relative: 1 %
EOS ABS: 450 {cells}/uL (ref 15–500)
Eosinophils Relative: 9 %
HCT: 38.6 % (ref 35.0–45.0)
Hemoglobin: 12.5 g/dL (ref 11.7–15.5)
Lymphocytes Relative: 46 %
Lymphs Abs: 2300 cells/uL (ref 850–3900)
MCH: 30.6 pg (ref 27.0–33.0)
MCHC: 32.4 g/dL (ref 32.0–36.0)
MCV: 94.6 fL (ref 80.0–100.0)
MPV: 10.5 fL (ref 7.5–12.5)
Monocytes Absolute: 300 cells/uL (ref 200–950)
Monocytes Relative: 6 %
Neutro Abs: 1900 cells/uL (ref 1500–7800)
Neutrophils Relative %: 38 %
Platelets: 266 10*3/uL (ref 140–400)
RBC: 4.08 MIL/uL (ref 3.80–5.10)
RDW: 14.3 % (ref 11.0–15.0)
WBC: 5 10*3/uL (ref 3.8–10.8)

## 2016-10-07 LAB — COMPLETE METABOLIC PANEL WITH GFR
ALBUMIN: 4.2 g/dL (ref 3.6–5.1)
ALK PHOS: 50 U/L (ref 33–130)
ALT: 9 U/L (ref 6–29)
AST: 20 U/L (ref 10–35)
BILIRUBIN TOTAL: 0.4 mg/dL (ref 0.2–1.2)
BUN: 14 mg/dL (ref 7–25)
CO2: 25 mmol/L (ref 20–31)
CREATININE: 0.74 mg/dL (ref 0.50–1.05)
Calcium: 9.5 mg/dL (ref 8.6–10.4)
Chloride: 105 mmol/L (ref 98–110)
GFR, Est African American: >89 mL/min (ref >=60)
GLUCOSE: 86 mg/dL (ref 65–99)
Potassium: 4.4 mmol/L (ref 3.5–5.3)
Sodium: 139 mmol/L (ref 135–146)
Total Protein: 6.7 g/dL (ref 6.1–8.1)

## 2016-10-08 NOTE — Progress Notes (Signed)
Within normal limits

## 2016-10-10 ENCOUNTER — Telehealth: Payer: Self-pay | Admitting: Radiology

## 2016-10-10 NOTE — Telephone Encounter (Signed)
I have called patient to advise labs are normal  

## 2016-10-10 NOTE — Telephone Encounter (Signed)
-----   Message from Bo Merino, MD sent at 10/08/2016  1:23 PM EDT ----- Within normal limits

## 2016-10-19 DIAGNOSIS — Z1231 Encounter for screening mammogram for malignant neoplasm of breast: Secondary | ICD-10-CM | POA: Diagnosis not present

## 2016-12-06 NOTE — Progress Notes (Signed)
Office Visit Note  Patient: Samantha Hughes             Date of Birth: 06-28-1960           MRN: [id number]             PCP: Kelton Pillar, MD Referring: Kelton Pillar, MD Visit Date: 12/13/2016 Occupation: @GUAROCC @    Subjective:  Pain in right hand and left knee   History of Present Illness: Samantha Hughes is a 56 y.o. female with history of sero positive rheumatoid arthritis and discoid lupus. She states she has intermittent pain in her right hand and her left knee joint. She's been having some discomfort in her left knee recently but is resolved now.  Activities of Daily Living:  Patient reports morning stiffness for 0 minutes.   Patient Denies nocturnal pain.  Difficulty dressing/grooming: Denies Difficulty climbing stairs: Denies Difficulty getting out of chair: Denies Difficulty using hands for taps, buttons, cutlery, and/or writing: Denies   Review of Systems  Constitutional: Negative.  Negative for fatigue and weakness.  HENT: Negative.  Negative for mouth dryness.   Eyes: Negative.  Negative for dryness.  Respiratory: Negative.  Negative for cough and shortness of breath.   Cardiovascular: Negative.  Negative for chest pain, palpitations, hypertension and swelling in legs/feet.  Gastrointestinal: Positive for constipation. Negative for blood in stool and diarrhea.  Musculoskeletal: Positive for arthralgias, joint pain and joint swelling. Negative for myalgias, muscle weakness, morning stiffness, muscle tenderness and myalgias.  Skin: Negative.  Negative for rash, hair loss and sensitivity to sunlight.  Neurological: Negative.  Negative for dizziness, numbness and headaches.  Psychiatric/Behavioral: Negative for depressed mood and sleep disturbance.    PMFS History:  Patient Active Problem List   Diagnosis Date Noted  . Smoker 07/08/2016  . Rheumatoid arthritis with rheumatoid factor of multiple sites without organ or systems involvement ([place]) 02/06/2016  .  Discoid lupus 02/06/2016  . Primary osteoarthritis of both hands 02/06/2016  . Primary osteoarthritis of both knees 02/06/2016  . 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 High risk medication use 02/06/2016  . Positive PPD, treated 02/06/2016    Past Medical History:  Diagnosis Date  . Depression   . Osteoarthritis   . Rheumatoid arthritis ([employer])     Family History  Problem Relation Age of Onset  . Diabetes Mother   . Hypertension Mother   . Diabetes Sister   . Diabetes Maternal Grandmother   . Cancer Maternal Grandfather    Past Surgical History:  Procedure Laterality Date  . eye lift    . TUBAL LIGATION     Social History   Social History Narrative  . No narrative on file     Objective: Vital Signs: BP 114/76 (BP Location: Left Arm, Patient Position: Sitting, Cuff Size: Normal)   Pulse 79   Ht 5\' 6"  (1.676 m)   Wt 174 lb (78.9 kg)   BMI 28.08 kg/m    Physical Exam  Constitutional: She is oriented to person, place, and time. She appears well-developed and well-nourished.  HENT:  Head: Normocephalic and atraumatic.  Eyes: Conjunctivae and EOM are normal.  Neck: Normal range of motion.  Cardiovascular: Normal rate, regular rhythm, normal heart sounds and intact distal pulses.   Pulmonary/Chest: Effort normal and breath sounds normal.  Abdominal: Soft. Bowel sounds are normal.  Lymphadenopathy:    She has no cervical adenopathy.  Neurological: She is alert and oriented to person, place, and time.  Skin:  Skin is warm and dry. Capillary refill takes less than 2 seconds.  Psychiatric: She has a normal mood and affect. Her behavior is normal.  Nursing note and vitals reviewed.    Musculoskeletal Exam: C-spine and thoracic lumbar spine good range of motion. Shoulder joints elbow joints wrist joints are good range of motion. She has some synovial thickening over her MCP joints but no active synovitis was noted. Hip joints knee joints ankles MTPs PIPs with good range  of motion. She is some discomfort range of motion of her knee joints without any warmth swelling or effusion. No MTP synovitis was noted but she has some tenderness across her MTPs.  CDAI Exam: CDAI Homunculus Exam:   Tenderness:  Right hand: 2nd MCP LLE: tibiofemoral  Joint Counts:  CDAI Tender Joint count: 2 CDAI Swollen Joint count: 0  Global Assessments:  Patient Global Assessment: 2 Provider Global Assessment: 2  CDAI Calculated Score: 6    Investigation: No additional findings.  CBC Latest Ref Rng & Units 10/07/2016 07/12/2016 02/07/2016  WBC 3.8 - 10.8 K/uL 5.0 5.2 4.4  Hemoglobin 11.7 - 15.5 g/dL 12.5 12.6 12.6  Hematocrit 35.0 - 45.0 % 38.6 38.4 40.0  Platelets 140 - 400 K/uL 266 250 229    CMP Latest Ref Rng & Units 10/07/2016 07/12/2016 02/07/2016  Glucose 65 - 99 mg/dL 86 38(LL) 54(L)  BUN 7 - 25 mg/dL 14 15 15   Creatinine 0.50 - 1.05 mg/dL 0.74 0.75 0.89  Sodium 135 - 146 mmol/L 139 141 142  Potassium 3.5 - 5.3 mmol/L 4.4 5.2 3.8  Chloride 98 - 110 mmol/L 105 106 108  CO2 20 - 31 mmol/L 25 28 29   Calcium 8.6 - 10.4 mg/dL 9.5 9.6 9.4  Total Protein 6.1 - 8.1 g/dL 6.7 6.7 6.8  Total Bilirubin 0.2 - 1.2 mg/dL 0.4 0.4 0.4  Alkaline Phos 33 - 130 U/L 50 48 51  AST 10 - 35 U/L 20 18 17   ALT 6 - 29 U/L 9 12 9    Imaging: Xr Foot 2 Views Left  Result Date: 12/13/2016 First MTP narrowing was noted. All other MTPs appear normal. Minimal PIP/DIP narrowing was noted. No erosive changes were noted. Impression: Mild osteoarthritis of the foot.  Xr Foot 2 Views Right  Result Date: 12/13/2016 First MTP narrowing was noted. All other MTPs appear normal. Minimal PIP/DIP narrowing was noted. No erosive changes were noted. Impression: Mild osteoarthritis of the foot.  Xr Hand 2 View Left  Result Date: 12/13/2016 Minimal PIP narrowing was noted. No MCP or intercarpal joint space narrowing was noted. No erosive changes were noted. Mild juxta articular osteopenia was noted.  Impression: These findings are consistent with rheumatoid arthritis and osteoarthritis overlap.  Xr Hand 2 View Right  Result Date: 12/13/2016 Juxta articular osteopenia was noted. First second and third MCP joint space narrowing was noted. No intercarpal joint space narrowing was noted. No erosive changes were noted. Mild PIP/DIP narrowing was noted. Impression: These findings are consistent with rheumatoid arthritis and osteoarthritis overlap.  Xr Knee 3 View Left  Result Date: 12/13/2016 Mild medial compartment narrowing no chondrocalcinosis was noted. Mild patellofemoral narrowing. Impression: Mild osteoarthritis and mild chondromalacia patella.  Xr Knee 3 View Right  Result Date: 12/13/2016 Mild medial compartment narrowing no chondrocalcinosis was noted. Mild patellofemoral narrowing. Impression: Mild osteoarthritis and mild chondromalacia patella.   Speciality Comments: No specialty comments available.    Procedures:  No procedures performed Allergies: Arava [leflunomide]; Plaquenil [hydroxychloroquine sulfate]; and Methotrexate  derivatives   Assessment / Plan:     Visit Diagnoses: Rheumatoid arthritis with rheumatoid factor of multiple sites without organ or systems involvement+RF +CCP  [facility]): Patient continues to have some arthralgias and has some synovial thickening but no active synovitis.  Discoid lupus: She has not had rash multiple years.  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  High risk medication use - Sulfasalazine 500 mg by mouth 2 tablets twice a day, Celebrex 200 mg by mouth twice a day when necessary - Plan: CBC with Differential/Platelet, COMPLETE METABOLIC PANEL WITH GFR, today and then every 3 months. CBC with Differential/Platelet, COMPLETE METABOLIC PANEL WITH GFR  Primary osteoarthritis of both hands - Plan: XR Hand 2 View Right, XR Hand 2 View Left he and  Primary osteoarthritis of both knees - Plan: XR KNEE 3 VIEW RIGHT, XR  KNEE 3 VIEW LEFT  Pain in both feet - Plan: XR Foot 2 Views Right, XR Foot 2 Views Left  Positive PPD, treated - with INH  Smoker : Smoking cessation was discussed.  Association of heart disease with rheumatoid arthritis was discussed. Need to monitor blood pressure, cholesterol, and to exercise 30-60 minutes on daily basis was discussed. Poor dental hygiene can be a predisposing factor for rheumatoid arthritis. Good dental hygiene was discussed.   Orders: Orders Placed This Encounter  Procedures  . XR Hand 2 View Right  . XR Hand 2 View Left  . XR KNEE 3 VIEW RIGHT  . XR KNEE 3 VIEW LEFT  . XR Foot 2 Views Right  . XR Foot 2 Views Left  . CBC with Differential/Platelet  . COMPLETE METABOLIC PANEL WITH GFR  . CBC with Differential/Platelet  . COMPLETE METABOLIC PANEL WITH GFR   No orders of the defined types were placed in this encounter.   Face-to-face time spent with patient was 30 minutes. Greater than 50% of time was spent in counseling and coordination of care.  Follow-Up Instructions: Return in about 5 months (around 05/13/2017) for Rheumatoid arthritis.   Bo Merino, MD  Note - This record has been created using Editor, commissioning.  Chart creation errors have been sought, but may not always  have been located. Such creation errors do not reflect on  the standard of medical care.

## 2016-12-13 ENCOUNTER — Ambulatory Visit (INDEPENDENT_AMBULATORY_CARE_PROVIDER_SITE_OTHER): Payer: 59

## 2016-12-13 ENCOUNTER — Ambulatory Visit (INDEPENDENT_AMBULATORY_CARE_PROVIDER_SITE_OTHER): Payer: 59 | Admitting: Rheumatology

## 2016-12-13 ENCOUNTER — Ambulatory Visit (INDEPENDENT_AMBULATORY_CARE_PROVIDER_SITE_OTHER): Payer: Self-pay

## 2016-12-13 ENCOUNTER — Encounter: Payer: Self-pay | Admitting: Rheumatology

## 2016-12-13 VITALS — BP 114/76 | HR 79 | Ht 66.0 in | Wt 174.0 lb

## 2016-12-13 DIAGNOSIS — M19041 Primary osteoarthritis, right hand: Secondary | ICD-10-CM

## 2016-12-13 DIAGNOSIS — R7611 Nonspecific reaction to tuberculin skin test without active tuberculosis: Secondary | ICD-10-CM | POA: Diagnosis not present

## 2016-12-13 DIAGNOSIS — M19042 Primary osteoarthritis, left hand: Secondary | ICD-10-CM

## 2016-12-13 DIAGNOSIS — M0579 Rheumatoid arthritis with rheumatoid factor of multiple sites without organ or systems involvement: Secondary | ICD-10-CM

## 2016-12-13 DIAGNOSIS — M79671 Pain in right foot: Secondary | ICD-10-CM

## 2016-12-13 DIAGNOSIS — M170 Bilateral primary osteoarthritis of knee: Secondary | ICD-10-CM | POA: Diagnosis not present

## 2016-12-13 DIAGNOSIS — M79672 Pain in left foot: Secondary | ICD-10-CM

## 2016-12-13 DIAGNOSIS — M1712 Unilateral primary osteoarthritis, left knee: Secondary | ICD-10-CM | POA: Diagnosis not present

## 2016-12-13 DIAGNOSIS — L930 Discoid lupus erythematosus: Secondary | ICD-10-CM | POA: Diagnosis not present

## 2016-12-13 DIAGNOSIS — Z79899 Other long term (current) drug therapy: Secondary | ICD-10-CM | POA: Diagnosis not present

## 2016-12-13 DIAGNOSIS — F17200 Nicotine dependence, unspecified, uncomplicated: Secondary | ICD-10-CM | POA: Diagnosis not present

## 2016-12-13 DIAGNOSIS — M1711 Unilateral primary osteoarthritis, right knee: Secondary | ICD-10-CM

## 2016-12-13 DIAGNOSIS — M0630 Rheumatoid nodule, unspecified site: Secondary | ICD-10-CM

## 2016-12-13 LAB — CBC WITH DIFFERENTIAL/PLATELET
Basophils Absolute: 28 cells/uL (ref 0–200)
Basophils Relative: 0.6 %
EOS PCT: 5.4 %
Eosinophils Absolute: 254 cells/uL (ref 15–500)
HCT: 38.4 % (ref 35.0–45.0)
HEMOGLOBIN: 12.7 g/dL (ref 11.7–15.5)
Lymphs Abs: 1720 cells/uL (ref 850–3900)
MCH: 30.5 pg (ref 27.0–33.0)
MCHC: 33.1 g/dL (ref 32.0–36.0)
MCV: 92.3 fL (ref 80.0–100.0)
MONOS PCT: 9.4 %
MPV: 11.1 fL (ref 7.5–12.5)
NEUTROS ABS: 2256 {cells}/uL (ref 1500–7800)
Neutrophils Relative %: 48 %
Platelets: 241 10*3/uL (ref 140–400)
RBC: 4.16 10*6/uL (ref 3.80–5.10)
RDW: 12.8 % (ref 11.0–15.0)
Total Lymphocyte: 36.6 %
WBC mixed population: 442 cells/uL (ref 200–950)
WBC: 4.7 10*3/uL (ref 3.8–10.8)

## 2016-12-13 LAB — COMPLETE METABOLIC PANEL WITH GFR
AG Ratio: 1.5 (calc) (ref 1.0–2.5)
ALT: 9 U/L (ref 6–29)
AST: 16 U/L (ref 10–35)
Albumin: 4.1 g/dL (ref 3.6–5.1)
Alkaline phosphatase (APISO): 53 U/L (ref 33–130)
BUN: 15 mg/dL (ref 7–25)
CO2: 29 mmol/L (ref 20–32)
CREATININE: 0.78 mg/dL (ref 0.50–1.05)
Calcium: 9.8 mg/dL (ref 8.6–10.4)
Chloride: 106 mmol/L (ref 98–110)
GFR, EST AFRICAN AMERICAN: 99 mL/min/{1.73_m2} (ref >=60)
GFR, Est Non African American: 86 mL/min/{1.73_m2} (ref >=60)
GLUCOSE: 69 mg/dL (ref 65–99)
Globulin: 2.8 g/dL (calc) (ref 1.9–3.7)
Potassium: 4.2 mmol/L (ref 3.5–5.3)
Sodium: 142 mmol/L (ref 135–146)
Total Bilirubin: 0.4 mg/dL (ref 0.2–1.2)
Total Protein: 6.9 g/dL (ref 6.1–8.1)

## 2016-12-13 NOTE — Patient Instructions (Addendum)
Standing Labs We placed an order today for your standing lab work.    Please come back and get your standing labs in January and every 3 months  We have open lab Monday through Friday from 8:30-11:30 AM and 1:30-4 PM at the office of Dr. Bo Merino.   The office is located at [address], [place], [place], [place] [address] No appointment is necessary.   Labs are drawn by [facility].  You may receive a bill from [facility] for your lab work. If you have any questions regarding directions or hours of operation,  please call [phone].     Smoking cessation was discussed.  Association of heart disease with rheumatoid arthritis was discussed. Need to monitor blood pressure, cholesterol, and to exercise 30-60 minutes on daily basis was discussed. Poor dental hygiene can be a predisposing factor for rheumatoid arthritis. Good dental hygiene was discussed.

## 2016-12-14 NOTE — Progress Notes (Signed)
WNLs

## 2016-12-18 ENCOUNTER — Telehealth: Payer: Self-pay | Admitting: Radiology

## 2016-12-18 NOTE — Telephone Encounter (Signed)
I have called patient to advise labs are normal  

## 2016-12-18 NOTE — Telephone Encounter (Signed)
-----   Message from Bo Merino, MD sent at 12/14/2016  3:23 PM EDT ----- WNLs

## 2017-01-09 DIAGNOSIS — L658 Other specified nonscarring hair loss: Secondary | ICD-10-CM | POA: Diagnosis not present

## 2017-02-07 DIAGNOSIS — H532 Diplopia: Secondary | ICD-10-CM | POA: Diagnosis not present

## 2017-03-13 ENCOUNTER — Other Ambulatory Visit: Payer: Self-pay | Admitting: Rheumatology

## 2017-03-13 NOTE — Telephone Encounter (Signed)
Last Visit: 12/13/16 Next Visit: 05/16/17 Labs: 12/13/16 WNL  Okay to refill per Dr. Estanislado Pandy

## 2017-04-23 ENCOUNTER — Other Ambulatory Visit: Payer: Self-pay | Admitting: Rheumatology

## 2017-04-23 NOTE — Telephone Encounter (Addendum)
Last Visit: 12/13/16 Next Visit: 05/16/17 Labs: 12/13/16 WNL  Left message to advise patient she is due to update labs  Okay to refill 30 day supply per Dr. Estanislado Pandy

## 2017-04-28 ENCOUNTER — Other Ambulatory Visit: Payer: Self-pay

## 2017-04-28 DIAGNOSIS — Z79899 Other long term (current) drug therapy: Secondary | ICD-10-CM

## 2017-04-28 LAB — COMPLETE METABOLIC PANEL WITH GFR
AG Ratio: 1.5 (calc) (ref 1.0–2.5)
ALKALINE PHOSPHATASE (APISO): 51 U/L (ref 33–130)
ALT: 10 U/L (ref 6–29)
AST: 16 U/L (ref 10–35)
Albumin: 4 g/dL (ref 3.6–5.1)
BUN: 14 mg/dL (ref 7–25)
CALCIUM: 9.5 mg/dL (ref 8.6–10.4)
CO2: 30 mmol/L (ref 20–32)
CREATININE: 0.86 mg/dL (ref 0.50–1.05)
Chloride: 107 mmol/L (ref 98–110)
GFR, EST NON AFRICAN AMERICAN: 76 mL/min/{1.73_m2} (ref >=60)
GFR, Est African American: 88 mL/min/{1.73_m2} (ref >=60)
GLOBULIN: 2.7 g/dL (ref 1.9–3.7)
GLUCOSE: 81 mg/dL (ref 65–99)
Potassium: 4.3 mmol/L (ref 3.5–5.3)
SODIUM: 142 mmol/L (ref 135–146)
Total Bilirubin: 0.3 mg/dL (ref 0.2–1.2)
Total Protein: 6.7 g/dL (ref 6.1–8.1)

## 2017-04-28 LAB — CBC WITH DIFFERENTIAL/PLATELET
BASOS PCT: 0.9 %
Basophils Absolute: 38 cells/uL (ref 0–200)
EOS ABS: 168 {cells}/uL (ref 15–500)
Eosinophils Relative: 4 %
HEMATOCRIT: 37.8 % (ref 35.0–45.0)
HEMOGLOBIN: 12.8 g/dL (ref 11.7–15.5)
LYMPHS ABS: 1961 {cells}/uL (ref 850–3900)
MCH: 30.6 pg (ref 27.0–33.0)
MCHC: 33.9 g/dL (ref 32.0–36.0)
MCV: 90.4 fL (ref 80.0–100.0)
MPV: 10.8 fL (ref 7.5–12.5)
Monocytes Relative: 7.8 %
NEUTROS ABS: 1705 {cells}/uL (ref 1500–7800)
Neutrophils Relative %: 40.6 %
PLATELETS: 254 10*3/uL (ref 140–400)
RBC: 4.18 10*6/uL (ref 3.80–5.10)
RDW: 12.8 % (ref 11.0–15.0)
Total Lymphocyte: 46.7 %
WBC: 4.2 10*3/uL (ref 3.8–10.8)
WBCMIX: 328 {cells}/uL (ref 200–950)

## 2017-05-02 NOTE — Progress Notes (Signed)
Office Visit Note  Patient: Samantha Hughes             Date of Birth: 1960-07-21           MRN: [id number]             PCP: Kelton Pillar, MD Referring: Kelton Pillar, MD Visit Date: 05/16/2017 Occupation: @GUAROCC @    Subjective:  Eye dryness    History of Present Illness: Samantha Hughes is a 57 y.o. female with history of discoid lupus, seropositive rheumatoid arthritis, and osteoarthritis.  The patient states she continues to take sulfasalazine 2 tablets twice daily.  She denies missing any doses recently.  Recent flares of her discoid lupus or rheumatoid arthritis.  She states she has no rashes or lesions at this time.  She states 1 week ago she was diagnosed with scleritis of the left eye.  She has been using eyedrops daily.  She states her symptoms have resolved.  She states she has been having recurrent mouth sores about once a month.  She states the sores resolve on their own quickly.  She denies any sores in her nose.  Patient denies any worsening fatigue, fevers, unintentional weight loss, or swollen lymph nodes.  She states she has been having some scalp tenderness.  She denies any hair loss.  She reports her symptoms of Raynaud's have resolved.  She denies any joint pain or joint swelling at this time.  She denies any joint stiffness.  States she experiences occasional discomfort in her bilateral hands but no swelling.  Knees and feet have been doing good.  She denies any new rheumatoid nodules.  The nodules on her bilateral elbows have resolved.     Activities of Daily Living:  Patient reports morning stiffness for 0 minutes.   Patient Denies nocturnal pain.  Difficulty dressing/grooming: Denies Difficulty climbing stairs: Denies Difficulty getting out of chair: Denies Difficulty using hands for taps, buttons, cutlery, and/or writing: Denies   Review of Systems  Constitutional: Negative for fatigue and weakness.  HENT: Positive for mouth sores. Negative for trouble  swallowing, trouble swallowing, mouth dryness and nose dryness.   Eyes: Positive for dryness. Negative for pain, redness and visual disturbance.  Respiratory: Negative for cough, hemoptysis, shortness of breath and difficulty breathing.   Cardiovascular: Negative for chest pain, palpitations, hypertension, irregular heartbeat and swelling in legs/feet.  Gastrointestinal: Negative for blood in stool, constipation and diarrhea.  Endocrine: Negative for increased urination.  Genitourinary: Negative for painful urination.  Musculoskeletal: Negative for arthralgias, joint pain, joint swelling, myalgias, muscle weakness, morning stiffness, muscle tenderness and myalgias.  Skin: Positive for sensitivity to sunlight. Negative for color change, pallor, rash, hair loss, nodules/bumps, redness, skin tightness and ulcers.  Allergic/Immunologic: Negative for susceptible to infections.  Neurological: Negative for dizziness, numbness and headaches.  Hematological: Negative for swollen glands.  Psychiatric/Behavioral: Negative for depressed mood and sleep disturbance. The patient is not nervous/anxious.     PMFS History:  Patient Active Problem List   Diagnosis Date Noted  . Smoker 07/08/2016  . Rheumatoid arthritis with rheumatoid factor of multiple sites without organ or systems involvement ([place]) 02/06/2016  . Discoid lupus 02/06/2016  . Primary osteoarthritis of both hands 02/06/2016  . Primary osteoarthritis of both knees 02/06/2016  . 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 High risk medication use 02/06/2016  . Positive PPD, treated 02/06/2016    Past Medical History:  Diagnosis Date  . Depression   . Osteoarthritis   . Rheumatoid  arthritis ([place])     Family History  Problem Relation Age of Onset  . Diabetes Mother   . Hypertension Mother   . Diabetes Sister   . Diabetes Maternal Grandmother   . Cancer Maternal Grandfather    Past Surgical History:  Procedure Laterality Date  . eye  lift    . TUBAL LIGATION     Social History   Social History Narrative  . Not on file     Objective: Vital Signs: BP 126/82 (BP Location: Left Arm, Patient Position: Sitting, Cuff Size: Normal)   Pulse 78   Resp 18   Ht 5\' 6"  (1.676 m)   Wt 177 lb 8 oz (80.5 kg)   BMI 28.65 kg/m    Physical Exam  Constitutional: She is oriented to person, place, and time. She appears well-developed and well-nourished.  HENT:  Head: Normocephalic and atraumatic.  Eyes: Conjunctivae and EOM are normal.  Neck: Normal range of motion.  Cardiovascular: Normal rate, regular rhythm, normal heart sounds and intact distal pulses.  Pulmonary/Chest: Effort normal and breath sounds normal.  Abdominal: Soft. Bowel sounds are normal.  Lymphadenopathy:    She has no cervical adenopathy.  Neurological: She is alert and oriented to person, place, and time.  Skin: Skin is warm and dry. Capillary refill takes less than 2 seconds.  Psychiatric: She has a normal mood and affect. Her behavior is normal.  Nursing note and vitals reviewed.    Musculoskeletal Exam: C-spine, thoracic spine, lumbar spine good range of motion.  No midline spinal tenderness.  No SI joint tenderness.  Shoulder joints, elbow joints, wrist joints,, MCPs, PIPs, DIPs good range of motion with no synovitis.  No nodulosis noted.  Hip joints, knee joints, ankle joints, MTPs, PIPs, DIPs good range of motion with no synovitis.  No warmth or effusion of bilateral knees.  Bilateral knee crepitus.  No trochanteric bursa tenderness.  CDAI Exam: CDAI Homunculus Exam:   Joint Counts:  CDAI Tender Joint count: 0 CDAI Swollen Joint count: 0  Global Assessments:  Patient Global Assessment: 4 Provider Global Assessment: 3  CDAI Calculated Score: 7    Investigation: No additional findings. CBC Latest Ref Rng & Units 04/28/2017 12/13/2016 10/07/2016  WBC 3.8 - 10.8 Thousand/uL 4.2 4.7 5.0  Hemoglobin 11.7 - 15.5 g/dL 12.8 12.7 12.5  Hematocrit  35.0 - 45.0 % 37.8 38.4 38.6  Platelets 140 - 400 Thousand/uL 254 241 266   CMP Latest Ref Rng & Units 04/28/2017 12/13/2016 10/07/2016  Glucose 65 - 99 mg/dL 81 69 86  BUN 7 - 25 mg/dL 14 15 14   Creatinine 0.50 - 1.05 mg/dL 0.86 0.78 0.74  Sodium 135 - 146 mmol/L 142 142 139  Potassium 3.5 - 5.3 mmol/L 4.3 4.2 4.4  Chloride 98 - 110 mmol/L 107 106 105  CO2 20 - 32 mmol/L 30 29 25   Calcium 8.6 - 10.4 mg/dL 9.5 9.8 9.5  Total Protein 6.1 - 8.1 g/dL 6.7 6.9 6.7  Total Bilirubin 0.2 - 1.2 mg/dL 0.3 0.4 0.4  Alkaline Phos 33 - 130 U/L - - 50  AST 10 - 35 U/L 16 16 20   ALT 6 - 29 U/L 10 9 9     Imaging: No results found.  Speciality Comments: No specialty comments available.    Procedures:  No procedures performed Allergies: Arava [leflunomide]; Plaquenil [hydroxychloroquine sulfate]; and Methotrexate derivatives   Assessment / Plan:     Visit Diagnoses: Discoid lupus: She has not had any recent flares  of her discoid lupus.  She has no rashes or lesions at this time.  She no longer follows up with a dermatologist.  She states she continues to take sulfasalazine 2 tablets twice daily.  Rheumatoid arthritis with rheumatoid factor of multiple sites without organ or systems involvement [facility]): She has no synovitis or tenderness on exam.  She has synovial thickening of MCPs but no synovitis.  She has had no recent flares. she experiences occasional arthralgias but no swelling. She will continue on sulfasalazine 2 tablets twice daily.    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  Nodules on bilateral elbows have resolved.   High risk medication use - SSZ 1,000 mg, 2 times daily. Standing orders for CBC and CMP are in place.  Her labs on 04/28/17 were WNL.   Primary osteoarthritis of both hands: She has no tenderness on exam.  Joint protection and muscle strengthening were discussed.  Primary osteoarthritis of both knees: No warmth or effusion on exam.  She has bilateral knee  crepitus.  Is no discomfort at this time.  Primary osteoarthritis of both feet: No discomfort at this time.  Importance of proper fitting shoes was discussed.  Positive PPD, treated - INH  Smoker    Orders: No orders of the defined types were placed in this encounter.  No orders of the defined types were placed in this encounter.     Follow-Up Instructions: Return in about 5 months (around 10/16/2017) for Rheumatoid arthritis, Discoid lupus.   Ofilia Neas, PA-C  I examined and evaluated the patient with Hazel Sams PA. The plan of care was discussed as noted above.  Bo Merino, MD Note - This record has been created using Editor, commissioning.  Chart creation errors have been sought, but may not always  have been located. Such creation errors do not reflect on  the standard of medical care.

## 2017-05-09 DIAGNOSIS — H15112 Episcleritis periodica fugax, left eye: Secondary | ICD-10-CM | POA: Diagnosis not present

## 2017-05-16 ENCOUNTER — Encounter: Payer: Self-pay | Admitting: Physician Assistant

## 2017-05-16 ENCOUNTER — Ambulatory Visit: Payer: 59 | Admitting: Physician Assistant

## 2017-05-16 VITALS — BP 126/82 | HR 78 | Resp 18 | Ht 66.0 in | Wt 177.5 lb

## 2017-05-16 DIAGNOSIS — M19071 Primary osteoarthritis, right ankle and foot: Secondary | ICD-10-CM | POA: Diagnosis not present

## 2017-05-16 DIAGNOSIS — Z79899 Other long term (current) drug therapy: Secondary | ICD-10-CM | POA: Diagnosis not present

## 2017-05-16 DIAGNOSIS — M19072 Primary osteoarthritis, left ankle and foot: Secondary | ICD-10-CM

## 2017-05-16 DIAGNOSIS — M0630 Rheumatoid nodule, unspecified site: Secondary | ICD-10-CM

## 2017-05-16 DIAGNOSIS — L930 Discoid lupus erythematosus: Secondary | ICD-10-CM

## 2017-05-16 DIAGNOSIS — F17200 Nicotine dependence, unspecified, uncomplicated: Secondary | ICD-10-CM | POA: Diagnosis not present

## 2017-05-16 DIAGNOSIS — M0579 Rheumatoid arthritis with rheumatoid factor of multiple sites without organ or systems involvement: Secondary | ICD-10-CM

## 2017-05-16 DIAGNOSIS — M19042 Primary osteoarthritis, left hand: Secondary | ICD-10-CM

## 2017-05-16 DIAGNOSIS — R7611 Nonspecific reaction to tuberculin skin test without active tuberculosis: Secondary | ICD-10-CM

## 2017-05-16 DIAGNOSIS — M170 Bilateral primary osteoarthritis of knee: Secondary | ICD-10-CM | POA: Diagnosis not present

## 2017-05-16 DIAGNOSIS — M19041 Primary osteoarthritis, right hand: Secondary | ICD-10-CM

## 2017-05-16 NOTE — Patient Instructions (Signed)
Standing Labs We placed an order today for your standing lab work.    Please come back and get your standing labs in May and every 3 months  We have open lab Monday through Friday from 8:30-11:30 AM and 1:30-4 PM at the office of Dr. Shaili Deveshwar.   The office is located at [address], [address], [place], [place] [address] No appointment is necessary.   Labs are drawn by [facility].  You may receive a bill from [facility] for your lab work. If you have any questions regarding directions or hours of operation,  please call [phone].    

## 2017-06-02 ENCOUNTER — Other Ambulatory Visit: Payer: Self-pay | Admitting: Rheumatology

## 2017-06-03 NOTE — Telephone Encounter (Signed)
Last Visit: 05/16/17 Next Visit: 10/17/17 Labs: 04/28/17 WNL  Okay to refill per Dr. Estanislado Pandy

## 2017-10-03 DIAGNOSIS — Z1159 Encounter for screening for other viral diseases: Secondary | ICD-10-CM | POA: Diagnosis not present

## 2017-10-03 DIAGNOSIS — Z0000 Encounter for general adult medical examination without abnormal findings: Secondary | ICD-10-CM | POA: Diagnosis not present

## 2017-10-03 DIAGNOSIS — Z136 Encounter for screening for cardiovascular disorders: Secondary | ICD-10-CM | POA: Diagnosis not present

## 2017-10-06 NOTE — Progress Notes (Signed)
Office Visit Note  Patient: Samantha Hughes             Date of Birth: [birth year]           MRN: [id number]             PCP: Kelton Pillar, MD Referring: Kelton Pillar, MD Visit Date: 10/17/2017 Occupation: @GUAROCC @  Subjective:  Medication monitoring   History of Present Illness: Samantha Hughes is a 57 y.o. female with history of discoid, osteoarthritis, and seropositive rheumatoid arthritis. She is taking SSZ 1,000 mg BID.  She is been tolerating sulfasalazine and has not missed any doses recently.  She denies any recent rheumatoid arthritis flares.  She states occasionally she has had some discomfort in her left knee joint and notices some swelling but she denies any pain or swelling at this time.  She denies any joint pain or joint swelling.  Denies any joint stiffness.  She has not noticed any rheumatoid nodules.  She states that she takes Celebrex 200 mg 1 tablet daily.  She states that she is having increased pain she will take Celebrex 200 mg twice daily.  She denies any recent rashes or discoid lesions.  She denies any sores in her mouth or nose.  Denies any swollen lymph nodes.  Denies any symptoms of [place].  Denies any fatigue or recent low-grade fevers.   Activities of Daily Living:  Patient reports morning stiffness for 0 none.   Patient Denies nocturnal pain.  Difficulty dressing/grooming: Denies Difficulty climbing stairs: Denies Difficulty getting out of chair: Denies Difficulty using hands for taps, buttons, cutlery, and/or writing: Denies  Review of Systems  Constitutional: Negative for activity change and fatigue.  HENT: Negative for mouth sores, mouth dryness and nose dryness.   Eyes: Positive for itching and dryness. Negative for pain and visual disturbance.  Respiratory: Negative for cough, hemoptysis, shortness of breath and difficulty breathing.   Cardiovascular: Negative for chest pain, palpitations, hypertension and swelling in legs/feet.    Gastrointestinal: Positive for constipation. Negative for blood in stool and diarrhea.  Endocrine: Negative for increased urination.  Genitourinary: Negative for difficulty urinating and painful urination.  Musculoskeletal: Positive for arthralgias, joint pain and joint swelling. Negative for myalgias, muscle weakness, morning stiffness, muscle tenderness and myalgias.  Skin: Negative for color change, pallor, rash, hair loss, nodules/bumps, skin tightness, ulcers and sensitivity to sunlight.  Allergic/Immunologic: Negative for susceptible to infections.  Neurological: Negative for dizziness, numbness, headaches and weakness.  Hematological: Negative for swollen glands.  Psychiatric/Behavioral: Negative for depressed mood and sleep disturbance. The patient is not nervous/anxious.     PMFS History:  Patient Active Problem List   Diagnosis Date Noted  . Smoker 07/08/2016  . Rheumatoid arthritis with rheumatoid factor of multiple sites without organ or systems involvement ([place]) 02/06/2016  . Discoid lupus 02/06/2016  . Primary osteoarthritis of both hands 02/06/2016  . Primary osteoarthritis of both knees 02/06/2016  . 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 High risk medication use 02/06/2016  . Positive PPD, treated 02/06/2016    Past Medical History:  Diagnosis Date  . Depression   . Osteoarthritis   . Rheumatoid arthritis ([place])     Family History  Problem Relation Age of Onset  . Diabetes Mother   . Hypertension Mother   . Diabetes Sister   . Diabetes Maternal Grandmother   . Cancer Maternal Grandfather    Past Surgical History:  Procedure Laterality Date  . eye lift    .  TUBAL LIGATION     Social History   Social History Narrative  . Not on file    Objective: Vital Signs: BP 130/80 (BP Location: Left Arm, Patient Position: Sitting, Cuff Size: Normal)   Pulse 78   Resp 14   Ht 5\' 6"  (1.676 m)   Wt 180 lb 9.6 oz (81.9 kg)   BMI 29.15 kg/m    Physical Exam   Constitutional: She is oriented to person, place, and time. She appears well-developed and well-nourished.  HENT:  Head: Normocephalic and atraumatic.  Eyes: Conjunctivae and EOM are normal.  Neck: Normal range of motion.  Cardiovascular: Normal rate, regular rhythm, normal heart sounds and intact distal pulses.  Pulmonary/Chest: Effort normal and breath sounds normal.  Abdominal: Soft. Bowel sounds are normal.  Lymphadenopathy:    She has no cervical adenopathy.  Neurological: She is alert and oriented to person, place, and time.  Skin: Skin is warm and dry. Capillary refill takes less than 2 seconds.  No discoid lesions noted.  Psychiatric: She has a normal mood and affect. Her behavior is normal.  Nursing note and vitals reviewed.    Musculoskeletal Exam: C-spine, thoracic spine, lumbar spine good range of motion.  No midline spinal tenderness.  No SI joint tenderness.  Shoulder joints, elbow joints, wrist joints, MCPs, PIPs, DIPs good range of motion with no synovitis.  She has MCP synovial thickening.  Hip joints, knee joints, ankle joints, MTPs, PIPs, DIPs good range of motion with no synovitis.  No warmth or effusion of bilateral knee joints.  She has bilateral knee crepitus.  She is no tenderness of trochanteric bursa bilaterally.  No rheumatoid nodules noted.   CDAI Exam: CDAI Homunculus Exam:   Joint Counts:  CDAI Tender Joint count: 0 CDAI Swollen Joint count: 0  Global Assessments:  Patient Global Assessment: 2 Provider Global Assessment: 2  CDAI Calculated Score: 4   Investigation: No additional findings.  Imaging: No results found.  Recent Labs: Lab Results  Component Value Date   WBC 4.2 04/28/2017   HGB 12.8 04/28/2017   PLT 254 04/28/2017   NA 142 04/28/2017   K 4.3 04/28/2017   CL 107 04/28/2017   CO2 30 04/28/2017   GLUCOSE 81 04/28/2017   BUN 14 04/28/2017   CREATININE 0.86 04/28/2017   BILITOT 0.3 04/28/2017   ALKPHOS 50 10/07/2016   AST 16  04/28/2017   ALT 10 04/28/2017   PROT 6.7 04/28/2017   ALBUMIN 4.2 10/07/2016   CALCIUM 9.5 04/28/2017   GFRAA 88 04/28/2017    Speciality Comments: No specialty comments available.  Procedures:  No procedures performed Allergies: Arava [leflunomide]; Plaquenil [hydroxychloroquine sulfate]; and Methotrexate derivatives   Assessment / Plan:     Visit Diagnoses: Discoid lupus: She has not had any recent flares of discoid lupus.  She has not had any recent rashes.  She continues to wear sunscreen on a daily basis.  She will continue taking Sulfasalazine 500 mg 2 tablets BID.  A refill was sent to the pharmacy.   Rheumatoid arthritis with rheumatoid factor of multiple sites without organ or systems involvement [facility]): She has no synovitis or tenderness on exam.  She has no palpable MCPs but no synovitis or ulnar deviation was noted.  She has not had any recent rheumatoid arthritis flares.  She has no joint pain or joint swelling at this time.  She occasionally has bilateral knee pain which typically resolves on its own.  She takes Celebrex 1 tablet  by mouth daily.  If she expenses increased knee pain she will take 2 tablets daily.  She will continue on sulfasalazine 500 mg 2 tablets twice daily.  A refill was sent to the pharmacy today.  She is advised to notify us if she develops increased joint pain or joint swelling.  She will follow-up in the office in 5 months.  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  High risk medication use - SSZ 1,000 mg, 2 times daily -CBC and CMP were drawn today to monitor for drug toxicity.  Plan: CBC with Differential/Platelet, COMPLETE METABOLIC PANEL WITH GFR  Primary osteoarthritis of both hands: She has no tenderness or discomfort at this time.  Joint protection and muscle strengthening were discussed.  Primary osteoarthritis of both knees: She experiences occasional discomfort in bilateral knee joints.  No warmth or effusion was noted today.   She has bilateral knee crepitus.  She takes Celebrex 1 tablet by mouth daily and if she experiences increased pain she will take 2 tablets today.  Primary osteoarthritis of both feet: She has no discomfort at this time.  She wears proper fitting shoes.  Other medical conditions are listed as follows:  Positive PPD, treated - INH  Smoker   Orders: Orders Placed This Encounter  Procedures  . CBC with Differential/Platelet  . COMPLETE METABOLIC PANEL WITH GFR   Meds ordered this encounter  Medications  . sulfaSALAzine (AZULFIDINE) 500 MG EC tablet    Sig: Take 2 tablets (1,000 mg total) by mouth 2 (two) times daily.    Dispense:  120 tablet    Refill:  2     Follow-Up Instructions: Return in about 5 months (around 03/19/2018) for Rheumatoid arthritis, Discoid lupus, Osteoarthritis.   Ofilia Neas, PA-C  Note - This record has been created using Dragon software.  Chart creation errors have been sought, but may not always  have been located. Such creation errors do not reflect on  the standard of medical care.

## 2017-10-17 ENCOUNTER — Ambulatory Visit: Payer: 59 | Admitting: Physician Assistant

## 2017-10-17 ENCOUNTER — Encounter: Payer: Self-pay | Admitting: Physician Assistant

## 2017-10-17 VITALS — BP 130/80 | HR 78 | Resp 14 | Ht 66.0 in | Wt 180.6 lb

## 2017-10-17 DIAGNOSIS — M0579 Rheumatoid arthritis with rheumatoid factor of multiple sites without organ or systems involvement: Secondary | ICD-10-CM

## 2017-10-17 DIAGNOSIS — F17200 Nicotine dependence, unspecified, uncomplicated: Secondary | ICD-10-CM

## 2017-10-17 DIAGNOSIS — Z79899 Other long term (current) drug therapy: Secondary | ICD-10-CM

## 2017-10-17 DIAGNOSIS — M19071 Primary osteoarthritis, right ankle and foot: Secondary | ICD-10-CM

## 2017-10-17 DIAGNOSIS — M170 Bilateral primary osteoarthritis of knee: Secondary | ICD-10-CM

## 2017-10-17 DIAGNOSIS — M0630 Rheumatoid nodule, unspecified site: Secondary | ICD-10-CM

## 2017-10-17 DIAGNOSIS — M19041 Primary osteoarthritis, right hand: Secondary | ICD-10-CM

## 2017-10-17 DIAGNOSIS — L930 Discoid lupus erythematosus: Secondary | ICD-10-CM

## 2017-10-17 DIAGNOSIS — M19072 Primary osteoarthritis, left ankle and foot: Secondary | ICD-10-CM

## 2017-10-17 DIAGNOSIS — R7611 Nonspecific reaction to tuberculin skin test without active tuberculosis: Secondary | ICD-10-CM

## 2017-10-17 DIAGNOSIS — M19042 Primary osteoarthritis, left hand: Secondary | ICD-10-CM

## 2017-10-17 LAB — COMPLETE METABOLIC PANEL WITH GFR
AG RATIO: 1.6 (calc) (ref 1.0–2.5)
ALKALINE PHOSPHATASE (APISO): 54 U/L (ref 33–130)
ALT: 11 U/L (ref 6–29)
AST: 19 U/L (ref 10–35)
Albumin: 4.2 g/dL (ref 3.6–5.1)
BILIRUBIN TOTAL: 0.4 mg/dL (ref 0.2–1.2)
BUN: 16 mg/dL (ref 7–25)
CO2: 22 mmol/L (ref 20–32)
Calcium: 9.4 mg/dL (ref 8.6–10.4)
Chloride: 109 mmol/L (ref 98–110)
Creat: 0.7 mg/dL (ref 0.50–1.05)
GFR, Est African American: 113 mL/min/{1.73_m2} (ref >=60)
GFR, Est Non African American: 98 mL/min/{1.73_m2} (ref >=60)
Globulin: 2.7 g/dL (calc) (ref 1.9–3.7)
Glucose, Bld: 74 mg/dL (ref 65–99)
POTASSIUM: 4 mmol/L (ref 3.5–5.3)
SODIUM: 143 mmol/L (ref 135–146)
Total Protein: 6.9 g/dL (ref 6.1–8.1)

## 2017-10-17 LAB — CBC WITH DIFFERENTIAL/PLATELET
BASOS ABS: 22 {cells}/uL (ref 0–200)
Basophils Relative: 0.5 %
EOS ABS: 242 {cells}/uL (ref 15–500)
EOS PCT: 5.5 %
HCT: 40.4 % (ref 35.0–45.0)
Hemoglobin: 13.4 g/dL (ref 11.7–15.5)
LYMPHS ABS: 1870 {cells}/uL (ref 850–3900)
MCH: 30.7 pg (ref 27.0–33.0)
MCHC: 33.2 g/dL (ref 32.0–36.0)
MCV: 92.7 fL (ref 80.0–100.0)
MPV: 11.2 fL (ref 7.5–12.5)
Monocytes Relative: 8.9 %
NEUTROS PCT: 42.6 %
Neutro Abs: 1874 cells/uL (ref 1500–7800)
Platelets: 231 10*3/uL (ref 140–400)
RBC: 4.36 10*6/uL (ref 3.80–5.10)
RDW: 13.2 % (ref 11.0–15.0)
Total Lymphocyte: 42.5 %
WBC: 4.4 10*3/uL (ref 3.8–10.8)
WBCMIX: 392 {cells}/uL (ref 200–950)

## 2017-10-17 MED ORDER — SULFASALAZINE 500 MG PO TBEC: 1000.0000 mg | DELAYED_RELEASE_TABLET | Freq: Two times a day (BID) | ORAL | 2 refills | Status: DC

## 2017-10-20 NOTE — Progress Notes (Signed)
Labs are WNL.

## 2017-10-24 DIAGNOSIS — Z1231 Encounter for screening mammogram for malignant neoplasm of breast: Secondary | ICD-10-CM | POA: Diagnosis not present

## 2017-11-04 DIAGNOSIS — H15011 Anterior scleritis, right eye: Secondary | ICD-10-CM | POA: Diagnosis not present

## 2018-01-26 ENCOUNTER — Other Ambulatory Visit: Payer: Self-pay | Admitting: Physician Assistant

## 2018-01-26 NOTE — Telephone Encounter (Addendum)
Last visit:10/17/17 Next Visit: 03/16/18 Labs: 10/17/17 WNL  Left message to advise patient she is due for labs.   Okay to refill 30 day supply per Dr. Estanislado Pandy

## 2018-01-30 DIAGNOSIS — H15011 Anterior scleritis, right eye: Secondary | ICD-10-CM | POA: Diagnosis not present

## 2018-02-03 ENCOUNTER — Telehealth: Payer: Self-pay | Admitting: Pharmacist

## 2018-02-03 NOTE — Telephone Encounter (Signed)
Left voicemail stating we needed to review note sent from her opthalmologist and medication options.  Instructed patient to call back a set up an appointment.

## 2018-02-04 DIAGNOSIS — H20011 Primary iridocyclitis, right eye: Secondary | ICD-10-CM | POA: Diagnosis not present

## 2018-02-04 DIAGNOSIS — H15001 Unspecified scleritis, right eye: Secondary | ICD-10-CM | POA: Diagnosis not present

## 2018-02-09 NOTE — Progress Notes (Signed)
Office Visit Note  Patient: Samantha Hughes             Date of Birth: [birth year]           MRN: [id number]             PCP: Kelton Pillar, MD Referring: Kelton Pillar, MD Visit Date: 02/12/2018 Occupation: _0 @  Subjective:  Recurrent scleritis   History of Present Illness: Samantha Hughes is a 57 y.o. female  with history of discoid, osteoarthritis, and seropositive rheumatoid arthritis.  Patient has been having recurrent to scleritis.  She has been treated with steroid eyedrops.  She is on sulfasalazine which is not controlling her eye symptoms.  Arthritis is well controlled on sulfasalazine and Celebrex combination.  Has been taking Celebrex every day.  He has had no recurrence of rash from discoid lupus.  Activities of Daily Living:  Patient reports morning stiffness for 0 none.   Patient Denies nocturnal pain.  Difficulty dressing/grooming: Denies Difficulty climbing stairs: Denies Difficulty getting out of chair: Denies Difficulty using hands for taps, buttons, cutlery, and/or writing: Reports  Review of Systems  Constitutional: Negative for fatigue, night sweats, weight gain and weight loss.  HENT: Negative for mouth sores, trouble swallowing, trouble swallowing, mouth dryness and nose dryness.   Eyes: Positive for pain and redness. Negative for visual disturbance and dryness.  Respiratory: Negative for cough, shortness of breath and difficulty breathing.   Cardiovascular: Negative for chest pain, palpitations, hypertension, irregular heartbeat and swelling in legs/feet.  Gastrointestinal: Positive for constipation. Negative for blood in stool and diarrhea.  Endocrine: Negative for increased urination.  Genitourinary: Negative for difficulty urinating and vaginal dryness.  Musculoskeletal: Negative for arthralgias, joint pain, joint swelling, myalgias, muscle weakness, morning stiffness, muscle tenderness and myalgias.  Skin: Negative for color change, rash, hair  loss, skin tightness, ulcers and sensitivity to sunlight.  Allergic/Immunologic: Negative for susceptible to infections.  Neurological: Negative for dizziness, memory loss, night sweats and weakness.  Hematological: Negative for bruising/bleeding tendency and swollen glands.  Psychiatric/Behavioral: Negative for depressed mood and sleep disturbance. The patient is not nervous/anxious.     PMFS History:  Patient Active Problem List   Diagnosis Date Noted  . Smoker 07/08/2016  . Rheumatoid arthritis with rheumatoid factor of multiple sites without organ or systems involvement ([place]) 02/06/2016  . Discoid lupus 02/06/2016  . Primary osteoarthritis of both hands 02/06/2016  . Primary osteoarthritis of both knees 02/06/2016  . 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 High risk medication use 02/06/2016  . Positive PPD, treated 02/06/2016    Past Medical History:  Diagnosis Date  . Depression   . Osteoarthritis   . Rheumatoid arthritis ([place])     Family History  Problem Relation Age of Onset  . Diabetes Mother   . Hypertension Mother   . Diabetes Sister   . Diabetes Maternal Grandmother   . Cancer Maternal Grandfather    Past Surgical History:  Procedure Laterality Date  . eye lift    . TUBAL LIGATION     Social History   Social History Narrative  . Not on file    Objective: Vital Signs: BP 131/75 (BP Location: Right Arm, Patient Position: Sitting, Cuff Size: Normal)   Pulse 68   Resp 14   Ht 5' 6" (1.676 m)   Wt 175 lb 6.4 oz (79.6 kg)   BMI 28.31 kg/m    Physical Exam  Constitutional: She is oriented to person, place, and time. She  appears well-developed and well-nourished.  HENT:  Head: Normocephalic and atraumatic.  Eyes: Conjunctivae and EOM are normal.  Neck: Normal range of motion.  Cardiovascular: Normal rate, regular rhythm, normal heart sounds and intact distal pulses.  Pulmonary/Chest: Effort normal and breath sounds normal.  Abdominal: Soft. Bowel  sounds are normal.  Lymphadenopathy:    She has no cervical adenopathy.  Neurological: She is alert and oriented to person, place, and time.  Skin: Skin is warm and dry. Capillary refill takes less than 2 seconds.  Psychiatric: She has a normal mood and affect. Her behavior is normal.  Nursing note and vitals reviewed.    Musculoskeletal Exam: C-spine thoracic lumbar spine good range of motion.  Shoulder joints elbow joints wrist joint MCPs PIPs DIPs been good range of motion with no synovitis.  Hip joints knee joints ankles MTPs PIPs DIPs been good range of motion with no synovitis.  CDAI Exam: CDAI Score: 0.2  Patient Global Assessment: 1 (mm); Provider Global Assessment: 1 (mm) Swollen: 0 ; Tender: 0  Joint Exam   Not documented   There is currently no information documented on the homunculus. Go to the Rheumatology activity and complete the homunculus joint exam.  Investigation: No additional findings.  Imaging: No results found.  Recent Labs: Lab Results  Component Value Date   WBC 4.4 10/17/2017   HGB 13.4 10/17/2017   PLT 231 10/17/2017   NA 143 10/17/2017   K 4.0 10/17/2017   CL 109 10/17/2017   CO2 22 10/17/2017   GLUCOSE 74 10/17/2017   BUN 16 10/17/2017   CREATININE 0.70 10/17/2017   BILITOT 0.4 10/17/2017   ALKPHOS 50 10/07/2016   AST 19 10/17/2017   ALT 11 10/17/2017   PROT 6.9 10/17/2017   ALBUMIN 4.2 10/07/2016   CALCIUM 9.4 10/17/2017   GFRAA 113 10/17/2017    Speciality Comments: No specialty comments available.  Procedures:  No procedures performed Allergies: Arava [leflunomide]; Plaquenil [hydroxychloroquine sulfate]; and Methotrexate derivatives   Assessment / Plan:     Visit Diagnoses: Discoid lupus-patient has history of discoid lupus but has not had rash in a long time.  Rheumatoid arthritis with rheumatoid factor of multiple sites without organ or systems involvement ([facility])-her arthritis is very well controlled without any synovitis on  sulfasalazine.  She has been experiencing recurrent scleritis.  We had detailed discussion regarding use of methotrexate.  She has been on methotrexate in the past and discontinued several years ago.  Indications side effects contraindications of methotrexate were discussed at length.  Handout was given and consent was taken.  I will obtain labs today and if the labs and chest x-ray normal then we will call in methotrexate prescription.  The plan is to start her on methotrexate 6 tablets p.o. weekly and increase it to 8 tablets p.o. weekly after 2 weeks if the labs are stable.  She will need labs every 2 weeks x 2 and then every 2 months to monitor for drug toxicity.  We will also give her folic acid 2 mg p.o. daily.  She may discontinue sulfasalazine after being on methotrexate for a month.  I will also call in Zofran 4 mg p.o. every 6 hours as needed for nausea.  Drug Counseling TB Gold: Pending Hepatitis panel: Pending  Chest-xray: Pending  Contraception: Discussed  Alcohol use: Discussed  Patient was counseled on the purpose, proper use, and adverse effects of methotrexate including nausea, infection, and signs and symptoms of pneumonitis.  Reviewed instructions with patient  to take methotrexate weekly along with folic acid daily.  Discussed the importance of frequent monitoring of kidney and liver function and blood counts, and provided patient with standing lab instructions.  Counseled patient to avoid NSAIDs and alcohol while on methotrexate.  Provided patient with educational materials on methotrexate and answered all questions.  Advised patient to get annual influenza vaccine and to get a pneumococcal vaccine if patient has not already had one.  Patient voiced understanding.  Patient consented to methotrexate use.  Will upload into chart.    Rheumatoid nodulosis ([facility])-no active nodulosis was noted today.  High risk medication use - SSZ 500 mg 2 tablet BIDCelebrex 200 mg 1 tablet by mouth  daily    Prior therapy includes Plaquenil (leukopenia) and prednisone.  She previously tolerated lower dose injectable methotrexate from 2005-2013 but oral caused GI side effects.  She stopped methotrexate herself due to complaints of flu like symptoms. - Plan: CBC with Differential/Platelet, COMPLETE METABOLIC PANEL WITH GFR, CBC with Differential/Platelet, CMP14+EGFR, Hepatitis B core antibody, IgM, Hepatitis B surface antigen, Hepatitis C antibody, HIV Antibody (routine testing w rflx), QuantiFERON-TB Gold Plus, Serum protein electrophoresis with reflex, IgG, IgA, IgM, DG Chest 2 View  Primary osteoarthritis of both hands-she does not have any synovitis or discomfort in her hands.  Primary osteoarthritis of both knees-she is crepitus but no warmth swelling or effusion.  Primary osteoarthritis of both feet  Positive PPD, treated-patient had positive PPD in the past which was treated.  She had chest x-rays which were normal several years ago.  We will obtain chest x-ray today.  I will also obtain TB Gold.  Smoker -smoking cessation was emphasized.  Orders: Orders Placed This Encounter  Procedures  . DG Chest 2 View  . CBC with Differential/Platelet  . COMPLETE METABOLIC PANEL WITH GFR  . CBC with Differential/Platelet  . CMP14+EGFR  . Hepatitis B core antibody, IgM  . Hepatitis B surface antigen  . Hepatitis C antibody  . HIV Antibody (routine testing w rflx)  . QuantiFERON-TB Gold Plus  . Serum protein electrophoresis with reflex  . IgG, IgA, IgM   No orders of the defined types were placed in this encounter.   Face-to-face time spent with patient was 30 minutes. Greater than 50% of time was spent in counseling and coordination of care.  Follow-Up Instructions: Return in about 3 months (around 05/14/2018) for Rheumatoid arthritis, DLE, , Osteoarthritis.   Bo Merino, MD  Note - This record has been created using Editor, commissioning.  Chart creation errors have been sought,  but may not always  have been located. Such creation errors do not reflect on  the standard of medical care.

## 2018-02-12 ENCOUNTER — Ambulatory Visit (HOSPITAL_COMMUNITY)
Admission: RE | Admit: 2018-02-12 | Discharge: 2018-02-12 | Disposition: A | Discharge status: Home / Self Care | Payer: 59 | Source: Ambulatory Visit | Attending: Rheumatology | Admitting: Rheumatology

## 2018-02-12 ENCOUNTER — Telehealth: Payer: Self-pay

## 2018-02-12 ENCOUNTER — Ambulatory Visit: Payer: 59 | Admitting: Rheumatology

## 2018-02-12 ENCOUNTER — Encounter: Payer: Self-pay | Admitting: Physician Assistant

## 2018-02-12 VITALS — BP 131/75 | HR 68 | Resp 14 | Ht 66.0 in | Wt 175.4 lb

## 2018-02-12 DIAGNOSIS — R4020 Unspecified coma: Secondary | ICD-10-CM | POA: Diagnosis not present

## 2018-02-12 DIAGNOSIS — M19041 Primary osteoarthritis, right hand: Secondary | ICD-10-CM

## 2018-02-12 DIAGNOSIS — M0579 Rheumatoid arthritis with rheumatoid factor of multiple sites without organ or systems involvement: Secondary | ICD-10-CM | POA: Diagnosis not present

## 2018-02-12 DIAGNOSIS — M0630 Rheumatoid nodule, unspecified site: Secondary | ICD-10-CM

## 2018-02-12 DIAGNOSIS — L930 Discoid lupus erythematosus: Secondary | ICD-10-CM

## 2018-02-12 DIAGNOSIS — M19042 Primary osteoarthritis, left hand: Secondary | ICD-10-CM

## 2018-02-12 DIAGNOSIS — M19072 Primary osteoarthritis, left ankle and foot: Secondary | ICD-10-CM

## 2018-02-12 DIAGNOSIS — M19071 Primary osteoarthritis, right ankle and foot: Secondary | ICD-10-CM

## 2018-02-12 DIAGNOSIS — Z79899 Other long term (current) drug therapy: Secondary | ICD-10-CM | POA: Diagnosis not present

## 2018-02-12 DIAGNOSIS — M170 Bilateral primary osteoarthritis of knee: Secondary | ICD-10-CM

## 2018-02-12 DIAGNOSIS — R7611 Nonspecific reaction to tuberculin skin test without active tuberculosis: Secondary | ICD-10-CM

## 2018-02-12 DIAGNOSIS — F17200 Nicotine dependence, unspecified, uncomplicated: Secondary | ICD-10-CM

## 2018-02-12 NOTE — Telephone Encounter (Signed)
Patient is to start MTX after lab results & chest xray.    6 tabs po weekly x2 weeks, increase to 8 tabs weekly.   Folic acid 2mg  daily.  Zofran.   Labs are to be 2 weeks x2 and then every 2 months.   Thanks!

## 2018-02-12 NOTE — Patient Instructions (Addendum)
Please get following immunization: Flu vaccine, pneumococcal vaccine, Shingrix vaccine  Standing Labs We placed an order today for your standing lab work.    Please come back and get your standing labs in 2 weeks x 2 then every 2 months  We have open lab Monday through Friday from 8:30-11:30 AM and 1:30-4:00 PM  at the office of Dr. Bo Merino.   You may experience shorter wait times on Monday and Friday afternoons. The office is located at [address] [place], [place], [place] [address] No appointment is necessary.   Labs are drawn by [facility].  You may receive a bill from [facility] for your lab work. If you have any questions regarding directions or hours of operation,  please call [phone].   Just as a reminder please drink plenty of water prior to coming for your lab work. Thanks!   Methotrexate tablets What is this medicine? METHOTREXATE (METH oh TREX ate) is a chemotherapy drug used to treat cancer including breast cancer, leukemia, and lymphoma. This medicine can also be used to treat psoriasis and certain kinds of arthritis. This medicine may be used for other purposes; ask your health care provider or pharmacist if you have questions. COMMON BRAND NAME(S): Rheumatrex, Trexall What should I tell my health care provider before I take this medicine? They need to know if you have any of these conditions: -fluid in the stomach area or lungs -if you often drink alcohol -infection or immune system problems -kidney disease or on hemodialysis -liver disease -low blood counts, like low white cell, platelet, or red cell counts -lung disease -radiation therapy -stomach ulcers -ulcerative colitis -an unusual or allergic reaction to methotrexate, other medicines, foods, dyes, or preservatives -pregnant or trying to get pregnant -breast-feeding How should I use this medicine? Take this medicine by mouth with a glass of water. Follow the directions on the prescription  label. Take your medicine at regular intervals. Do not take it more often than directed. Do not stop taking except on your doctor's advice. Make sure you know why you are taking this medicine and how often you should take it. If this medicine is used for a condition that is not cancer, like arthritis or psoriasis, it should be taken weekly, NOT daily. Taking this medicine more often than directed can cause serious side effects, even death. Talk to your healthcare provider about safe handling and disposal of this medicine. You may need to take special precautions. Talk to your pediatrician regarding the use of this medicine in children. While this drug may be prescribed for selected conditions, precautions do apply. Overdosage: If you think you have taken too much of this medicine contact a poison control center or emergency room at once. NOTE: This medicine is only for you. Do not share this medicine with others. What if I miss a dose? If you miss a dose, talk with your doctor or health care professional. Do not take double or extra doses. What may interact with this medicine? This medicine may interact with the following medication: -acitretin -aspirin and aspirin-like medicines including salicylates -azathioprine -certain antibiotics like penicillins, tetracycline, and chloramphenicol -cyclosporine -gold -hydroxychloroquine -live virus vaccines -NSAIDs, medicines for pain and inflammation, like ibuprofen or naproxen -other cytotoxic agents -penicillamine -phenylbutazone -phenytoin -probenecid -retinoids such as isotretinoin and tretinoin -steroid medicines like prednisone or cortisone -sulfonamides like sulfasalazine and trimethoprim/sulfamethoxazole -theophylline This list may not describe all possible interactions. Give your health care provider a list of all the medicines, herbs, non-prescription drugs,  or dietary supplements you use. Also tell them if you smoke, drink alcohol, or  use illegal drugs. Some items may interact with your medicine. What should I watch for while using this medicine? Avoid alcoholic drinks. This medicine can make you more sensitive to the sun. Keep out of the sun. If you cannot avoid being in the sun, wear protective clothing and use sunscreen. Do not use sun lamps or tanning beds/booths. You may need blood work done while you are taking this medicine. Call your doctor or health care professional for advice if you get a fever, chills or sore throat, or other symptoms of a cold or flu. Do not treat yourself. This drug decreases your body's ability to fight infections. Try to avoid being around people who are sick. This medicine may increase your risk to bruise or bleed. Call your doctor or health care professional if you notice any unusual bleeding. Check with your doctor or health care professional if you get an attack of severe diarrhea, nausea and vomiting, or if you sweat a lot. The loss of too much body fluid can make it dangerous for you to take this medicine. Talk to your doctor about your risk of cancer. You may be more at risk for certain types of cancers if you take this medicine. Both men and women must use effective birth control with this medicine. Do not become pregnant while taking this medicine or until at least 1 normal menstrual cycle has occurred after stopping it. Women should inform their doctor if they wish to become pregnant or think they might be pregnant. Men should not father a child while taking this medicine and for 3 months after stopping it. There is a potential for serious side effects to an unborn child. Talk to your health care professional or pharmacist for more information. Do not breast-feed an infant while taking this medicine. What side effects may I notice from receiving this medicine? Side effects that you should report to your doctor or health care professional as soon as possible: -allergic reactions like skin  rash, itching or hives, swelling of the face, lips, or tongue -breathing problems or shortness of breath -diarrhea -dry, nonproductive cough -low blood counts - this medicine may decrease the number of white blood cells, red blood cells and platelets. You may be at increased risk for infections and bleeding. -mouth sores -redness, blistering, peeling or loosening of the skin, including inside the mouth -signs of infection - fever or chills, cough, sore throat, pain or trouble passing urine -signs and symptoms of bleeding such as bloody or black, tarry stools; red or dark-brown urine; spitting up blood or brown material that looks like coffee grounds; red spots on the skin; unusual bruising or bleeding from the eye, gums, or nose -signs and symptoms of kidney injury like trouble passing urine or change in the amount of urine -signs and symptoms of liver injury like dark yellow or brown urine; general ill feeling or flu-like symptoms; light-colored stools; loss of appetite; nausea; right upper belly pain; unusually weak or tired; yellowing of the eyes or skin Side effects that usually do not require medical attention (report to your doctor or health care professional if they continue or are bothersome): -dizziness -hair loss -tiredness -upset stomach -vomiting This list may not describe all possible side effects. Call your doctor for medical advice about side effects. You may report side effects to FDA at 1-800-FDA-1088. Where should I keep my medicine? Keep out of the reach of  children. Store at room temperature between 20 and 25 degrees C (68 and 77 degrees F). Protect from light. Throw away any unused medicine after the expiration date. NOTE: This sheet is a summary. It may not cover all possible information. If you have questions about this medicine, talk to your doctor, pharmacist, or health care provider.  2018 Elsevier/Gold Standard (2014-10-31 05:39:22)  You may discontinue  sulfasalazine 1 month after starting methotrexate.

## 2018-02-13 NOTE — Progress Notes (Signed)
WNL

## 2018-02-16 LAB — COMPLETE METABOLIC PANEL WITH GFR
AG Ratio: 1.8 (calc) (ref 1.0–2.5)
ALBUMIN MSPROF: 4.5 g/dL (ref 3.6–5.1)
ALT: 13 U/L (ref 6–29)
AST: 19 U/L (ref 10–35)
Alkaline phosphatase (APISO): 54 U/L (ref 33–130)
BILIRUBIN TOTAL: 0.3 mg/dL (ref 0.2–1.2)
BUN: 16 mg/dL (ref 7–25)
CHLORIDE: 103 mmol/L (ref 98–110)
CO2: 31 mmol/L (ref 20–32)
Calcium: 9.9 mg/dL (ref 8.6–10.4)
Creat: 0.81 mg/dL (ref 0.50–1.05)
GFR, EST AFRICAN AMERICAN: 94 mL/min/{1.73_m2} (ref >=60)
GFR, Est Non African American: 81 mL/min/{1.73_m2} (ref >=60)
GLUCOSE: 72 mg/dL (ref 65–99)
Globulin: 2.5 g/dL (calc) (ref 1.9–3.7)
Potassium: 3.9 mmol/L (ref 3.5–5.3)
Sodium: 140 mmol/L (ref 135–146)
TOTAL PROTEIN: 7 g/dL (ref 6.1–8.1)

## 2018-02-16 LAB — CBC WITH DIFFERENTIAL/PLATELET
BASOS PCT: 0.8 %
Basophils Absolute: 38 cells/uL (ref 0–200)
EOS ABS: 254 {cells}/uL (ref 15–500)
Eosinophils Relative: 5.3 %
HEMATOCRIT: 37.7 % (ref 35.0–45.0)
HEMOGLOBIN: 12.4 g/dL (ref 11.7–15.5)
LYMPHS ABS: 2122 {cells}/uL (ref 850–3900)
MCH: 30.4 pg (ref 27.0–33.0)
MCHC: 32.9 g/dL (ref 32.0–36.0)
MCV: 92.4 fL (ref 80.0–100.0)
MONOS PCT: 5.5 %
MPV: 11.1 fL (ref 7.5–12.5)
NEUTROS ABS: 2122 {cells}/uL (ref 1500–7800)
Neutrophils Relative %: 44.2 %
Platelets: 247 10*3/uL (ref 140–400)
RBC: 4.08 10*6/uL (ref 3.80–5.10)
RDW: 12.9 % (ref 11.0–15.0)
Total Lymphocyte: 44.2 %
WBC: 4.8 10*3/uL (ref 3.8–10.8)
WBCMIX: 264 {cells}/uL (ref 200–950)

## 2018-02-16 LAB — IGG, IGA, IGM
IgG (Immunoglobin G), Serum: 1190 mg/dL (ref 600–1640)
IgM, Serum: 111 mg/dL (ref 50–300)
Immunoglobulin A: 249 mg/dL (ref 47–310)

## 2018-02-16 LAB — QUANTIFERON-TB GOLD PLUS
Mitogen-NIL: >10 IU/mL
NIL: 5.94 [IU]/mL
QuantiFERON-TB Gold Plus: POSITIVE — AB
TB1-NIL: >10 IU/mL
TB2-NIL: >10 IU/mL

## 2018-02-16 LAB — PROTEIN ELECTROPHORESIS, SERUM, WITH REFLEX
Albumin ELP: 4 g/dL (ref 3.8–4.8)
Alpha 1: 0.3 g/dL (ref 0.2–0.3)
Alpha 2: 0.8 g/dL (ref 0.5–0.9)
BETA GLOBULIN: 0.4 g/dL (ref 0.4–0.6)
Beta 2: 0.3 g/dL (ref 0.2–0.5)
Gamma Globulin: 1.1 g/dL (ref 0.8–1.7)
Total Protein: 6.9 g/dL (ref 6.1–8.1)

## 2018-02-16 LAB — HIV ANTIBODY (ROUTINE TESTING W REFLEX): HIV: NONREACTIVE

## 2018-02-16 LAB — HEPATITIS C ANTIBODY
Hepatitis C Ab: NONREACTIVE
SIGNAL TO CUT-OFF: 0.05 (ref <1.00)

## 2018-02-16 LAB — HEPATITIS B CORE ANTIBODY, IGM: Hep B C IgM: NONREACTIVE

## 2018-02-16 LAB — HEPATITIS B SURFACE ANTIGEN: Hepatitis B Surface Ag: NONREACTIVE

## 2018-02-16 MED ORDER — ONDANSETRON HCL 4 MG PO TABS: 4.0000 mg | ORAL_TABLET | Freq: Four times a day (QID) | ORAL | 2 refills | Status: DC | PRN

## 2018-02-16 MED ORDER — FOLIC ACID 1 MG PO TABS: 2.0000 mg | ORAL_TABLET | Freq: Every day | ORAL | 3 refills | Status: DC

## 2018-02-16 MED ORDER — METHOTREXATE 2.5 MG PO TABS: ORAL_TABLET | ORAL | 0 refills | Status: DC

## 2018-02-16 NOTE — Progress Notes (Signed)
All the labs are available now.  Please call in methotrexate as written in the office note.

## 2018-02-16 NOTE — Progress Notes (Signed)
Patient had positive PPD in the past and had been treated by health department.  She gets serial chest x-rays.  Her chest x-ray was normal.  We should be able to start her on methotrexate once all her lab results are available.

## 2018-02-16 NOTE — Telephone Encounter (Signed)
All labs resulted. Prescription sent to the pharmacy.

## 2018-02-21 ENCOUNTER — Other Ambulatory Visit: Payer: Self-pay | Admitting: Physician Assistant

## 2018-02-23 NOTE — Telephone Encounter (Signed)
Last Visit: 02/12/18 Next Visit: 05/14/18 Labs: 02/12/18 WNL  Okay to refill per Dr. Estanislado Pandy

## 2018-03-06 ENCOUNTER — Other Ambulatory Visit: Payer: Self-pay

## 2018-03-06 DIAGNOSIS — Z79899 Other long term (current) drug therapy: Secondary | ICD-10-CM | POA: Diagnosis not present

## 2018-03-06 LAB — COMPLETE METABOLIC PANEL WITH GFR
AG Ratio: 1.5 (calc) (ref 1.0–2.5)
ALT: 9 U/L (ref 6–29)
AST: 18 U/L (ref 10–35)
Albumin: 4 g/dL (ref 3.6–5.1)
Alkaline phosphatase (APISO): 53 U/L (ref 33–130)
BUN: 17 mg/dL (ref 7–25)
CO2: 29 mmol/L (ref 20–32)
Calcium: 9.8 mg/dL (ref 8.6–10.4)
Chloride: 107 mmol/L (ref 98–110)
Creat: 0.87 mg/dL (ref 0.50–1.05)
GFR, Est African American: 86 mL/min/{1.73_m2} (ref >=60)
GFR, Est Non African American: 74 mL/min/{1.73_m2} (ref >=60)
Globulin: 2.7 g/dL (calc) (ref 1.9–3.7)
Glucose, Bld: 82 mg/dL (ref 65–99)
Potassium: 4 mmol/L (ref 3.5–5.3)
Sodium: 143 mmol/L (ref 135–146)
TOTAL PROTEIN: 6.7 g/dL (ref 6.1–8.1)
Total Bilirubin: 0.4 mg/dL (ref 0.2–1.2)

## 2018-03-06 LAB — CBC WITH DIFFERENTIAL/PLATELET
Absolute Monocytes: 322 cells/uL (ref 200–950)
Basophils Absolute: 32 cells/uL (ref 0–200)
Basophils Relative: 0.7 %
Eosinophils Absolute: 382 cells/uL (ref 15–500)
Eosinophils Relative: 8.3 %
HCT: 36.2 % (ref 35.0–45.0)
Hemoglobin: 12 g/dL (ref 11.7–15.5)
Lymphs Abs: 2084 cells/uL (ref 850–3900)
MCH: 30.8 pg (ref 27.0–33.0)
MCHC: 33.1 g/dL (ref 32.0–36.0)
MCV: 93.1 fL (ref 80.0–100.0)
MPV: 11 fL (ref 7.5–12.5)
Monocytes Relative: 7 %
Neutro Abs: 1780 cells/uL (ref 1500–7800)
Neutrophils Relative %: 38.7 %
Platelets: 258 10*3/uL (ref 140–400)
RBC: 3.89 10*6/uL (ref 3.80–5.10)
RDW: 13.1 % (ref 11.0–15.0)
Total Lymphocyte: 45.3 %
WBC: 4.6 10*3/uL (ref 3.8–10.8)

## 2018-03-12 ENCOUNTER — Other Ambulatory Visit: Payer: Self-pay | Admitting: Rheumatology

## 2018-03-12 NOTE — Telephone Encounter (Signed)
Patient states she is on 8 tabs weekly of MTX.

## 2018-03-12 NOTE — Telephone Encounter (Signed)
Last Visit: 02/12/18 Next Visit: 05/14/18 Labs: 03/06/18 WNL  Attempted to contact the patient and left message to verify dose of MTX.  Okay to refill per Dr. Estanislado Pandy

## 2018-03-13 DIAGNOSIS — J069 Acute upper respiratory infection, unspecified: Secondary | ICD-10-CM | POA: Diagnosis not present

## 2018-03-13 DIAGNOSIS — D849 Immunodeficiency, unspecified: Secondary | ICD-10-CM | POA: Diagnosis not present

## 2018-03-16 ENCOUNTER — Ambulatory Visit: Payer: 59 | Admitting: Physician Assistant

## 2018-04-01 ENCOUNTER — Other Ambulatory Visit: Payer: Self-pay

## 2018-04-01 DIAGNOSIS — Z79899 Other long term (current) drug therapy: Secondary | ICD-10-CM

## 2018-04-02 LAB — CBC WITH DIFFERENTIAL/PLATELET
Absolute Monocytes: 211 cells/uL (ref 200–950)
Basophils Absolute: 29 cells/uL (ref 0–200)
Basophils Relative: 0.6 %
EOS PCT: 6.1 %
Eosinophils Absolute: 299 cells/uL (ref 15–500)
HCT: 36.3 % (ref 35.0–45.0)
Hemoglobin: 12.5 g/dL (ref 11.7–15.5)
LYMPHS ABS: 1833 {cells}/uL (ref 850–3900)
MCH: 32 pg (ref 27.0–33.0)
MCHC: 34.4 g/dL (ref 32.0–36.0)
MCV: 92.8 fL (ref 80.0–100.0)
MPV: 11.1 fL (ref 7.5–12.5)
Monocytes Relative: 4.3 %
Neutro Abs: 2528 cells/uL (ref 1500–7800)
Neutrophils Relative %: 51.6 %
PLATELETS: 258 10*3/uL (ref 140–400)
RBC: 3.91 10*6/uL (ref 3.80–5.10)
RDW: 12.9 % (ref 11.0–15.0)
Total Lymphocyte: 37.4 %
WBC: 4.9 10*3/uL (ref 3.8–10.8)

## 2018-04-02 LAB — COMPLETE METABOLIC PANEL WITH GFR
AG RATIO: 1.5 (calc) (ref 1.0–2.5)
ALT: 10 U/L (ref 6–29)
AST: 15 U/L (ref 10–35)
Albumin: 3.8 g/dL (ref 3.6–5.1)
Alkaline phosphatase (APISO): 48 U/L (ref 33–130)
BUN: 16 mg/dL (ref 7–25)
CALCIUM: 9.3 mg/dL (ref 8.6–10.4)
CO2: 27 mmol/L (ref 20–32)
Chloride: 109 mmol/L (ref 98–110)
Creat: 0.74 mg/dL (ref 0.50–1.05)
GFR, Est African American: 105 mL/min/{1.73_m2} (ref >=60)
GFR, Est Non African American: 91 mL/min/{1.73_m2} (ref >=60)
Globulin: 2.6 g/dL (calc) (ref 1.9–3.7)
Glucose, Bld: 98 mg/dL (ref 65–99)
Potassium: 4.2 mmol/L (ref 3.5–5.3)
Sodium: 142 mmol/L (ref 135–146)
Total Bilirubin: 0.5 mg/dL (ref 0.2–1.2)
Total Protein: 6.4 g/dL (ref 6.1–8.1)

## 2018-04-30 NOTE — Progress Notes (Signed)
Office Visit Note  Patient: Samantha Hughes             Date of Birth: [birth year]           MRN: [id number]             PCP: Kelton Pillar, MD Referring: Kelton Pillar, MD Visit Date: 05/14/2018 Occupation: @GUAROCC @  Subjective:  Fatigue    History of Present Illness: Samantha Hughes is a 58 y.o. female with history of discoid lupus, seropositive rheumatoid arthritis, and osteoarthritis.  She is on MTX 8 tablets po once weekly and folic acid 2 mg po daily.  She has noticed significant improvement in her symptoms since switching to MTX.  She states she has been having significant fatigue lasting the whole week after taking MTX.  She denies any nausea or vomiting.  She denies any joint pain or joint swelling.  She denies any morning stiffness.  She denies any discoid lesions or rashes.  She reports she has had 1 flare of scleritis in the right eye since starting on MTX but has no symptoms at this time.  She is followed by [facility].     Activities of Daily Living:  Patient reports morning stiffness for 0 minutes.   Patient Denies nocturnal pain.  Difficulty dressing/grooming: Denies Difficulty climbing stairs: Denies Difficulty getting out of chair: Denies Difficulty using hands for taps, buttons, cutlery, and/or writing: Denies  Review of Systems  Constitutional: Positive for fatigue (SE of MTX).  HENT: Positive for mouth sores. Negative for mouth dryness and nose dryness.   Eyes: Negative for pain, visual disturbance and dryness.  Respiratory: Negative for cough, hemoptysis, shortness of breath and difficulty breathing.   Cardiovascular: Negative for chest pain, palpitations, hypertension and swelling in legs/feet.  Gastrointestinal: Positive for constipation. Negative for blood in stool and diarrhea.  Endocrine: Negative for increased urination.  Genitourinary: Negative for painful urination.  Musculoskeletal: Negative for arthralgias, joint pain, joint swelling,  myalgias, muscle weakness, morning stiffness, muscle tenderness and myalgias.  Skin: Negative for color change, pallor, rash, hair loss, nodules/bumps, skin tightness, ulcers and sensitivity to sunlight.  Allergic/Immunologic: Negative for susceptible to infections.  Neurological: Positive for headaches. Negative for dizziness, numbness and weakness.  Hematological: Negative for swollen glands.  Psychiatric/Behavioral: Negative for depressed mood and sleep disturbance. The patient is not nervous/anxious.     PMFS History:  Patient Active Problem List   Diagnosis Date Noted  . Smoker 07/08/2016  . Rheumatoid arthritis with rheumatoid factor of multiple sites without organ or systems involvement ([place]) 02/06/2016  . Discoid lupus 02/06/2016  . Primary osteoarthritis of both hands 02/06/2016  . Primary osteoarthritis of both knees 02/06/2016  . 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 High risk medication use 02/06/2016  . Positive PPD, treated 02/06/2016    Past Medical History:  Diagnosis Date  . Depression   . Osteoarthritis   . Rheumatoid arthritis ([place])     Family History  Problem Relation Age of Onset  . Diabetes Mother   . Hypertension Mother   . Diabetes Sister   . Diabetes Maternal Grandmother   . Cancer Maternal Grandfather    Past Surgical History:  Procedure Laterality Date  . eye lift    . TUBAL LIGATION     Social History   Social History Narrative  . Not on file    There is no immunization history on file for this patient.   Objective: Vital Signs: BP 131/87 (BP Location: Left  Arm, Patient Position: Sitting, Cuff Size: Normal)   Pulse 70   Resp 12   Ht 5\' 6"  (1.676 m)   Wt 176 lb 6.4 oz (80 kg)   BMI 28.47 kg/m    Physical Exam Vitals signs and nursing note reviewed.  Constitutional:      Appearance: She is well-developed.  HENT:     Head: Normocephalic and atraumatic.  Eyes:     Conjunctiva/sclera: Conjunctivae normal.  Neck:      Musculoskeletal: Normal range of motion.  Cardiovascular:     Rate and Rhythm: Normal rate and regular rhythm.     Heart sounds: Normal heart sounds.  Pulmonary:     Effort: Pulmonary effort is normal.     Breath sounds: Normal breath sounds.  Abdominal:     General: Bowel sounds are normal.     Palpations: Abdomen is soft.  Lymphadenopathy:     Cervical: No cervical adenopathy.  Skin:    General: Skin is warm and dry.     Capillary Refill: Capillary refill takes less than 2 seconds.  Neurological:     Mental Status: She is alert and oriented to person, place, and time.  Psychiatric:        Behavior: Behavior normal.      Musculoskeletal Exam: C-spine, thoracic spine, and lumbar spine good ROM.  No midline spinal tenderness.  No SI joint tenderness.  Shoulder joints, elbow joints, wrist joints, MCPs, PIPs ,and DIPs good ROM with no synovitis.  Complete fist formation bilaterally.  Synovial thickening of right 2nd MCP.  Hip joints, knee joints, ankle joints, MTPs, PIPs, and DIPs good ROM with no synovitis.  No warmth or effusion of knee joints.  No tenderness or swelling of ankle joints.    CDAI Exam: CDAI Score: Not documented Patient Global Assessment: Not documented; Provider Global Assessment: Not documented Swollen: Not documented; Tender: Not documented Joint Exam   Not documented   There is currently no information documented on the homunculus. Go to the Rheumatology activity and complete the homunculus joint exam.  Investigation: No additional findings.  Imaging: No results found.  Recent Labs: Lab Results  Component Value Date   WBC 4.9 04/01/2018   HGB 12.5 04/01/2018   PLT 258 04/01/2018   NA 142 04/01/2018   K 4.2 04/01/2018   CL 109 04/01/2018   CO2 27 04/01/2018   GLUCOSE 98 04/01/2018   BUN 16 04/01/2018   CREATININE 0.74 04/01/2018   BILITOT 0.5 04/01/2018   ALKPHOS 50 10/07/2016   AST 15 04/01/2018   ALT 10 04/01/2018   PROT 6.4 04/01/2018    ALBUMIN 4.2 10/07/2016   CALCIUM 9.3 04/01/2018   GFRAA 105 04/01/2018   QFTBGOLDPLUS POSITIVE (A) 02/12/2018    Speciality Comments: No specialty comments available.  Procedures:  No procedures performed Allergies: Arava [leflunomide]; Plaquenil [hydroxychloroquine sulfate]; and Methotrexate derivatives   Assessment / Plan:     Visit Diagnoses: Discoid lupus: She has no discoid lesions.  She has no rashes at this time.  She was advised to wear sunscreen on a daily basis.  She will continue on MTX 8 tablets by mouth once weekly and folic acid 2 mg po daily.   Rheumatoid arthritis with rheumatoid factor of multiple sites without organ or systems involvement [facility]): She has no synovitis on exam.  She has not had any recent rheumatoid arthritis flares.  She has no joint pain or joint swelling at this time.  She has no morning stiffness.  She has noticed significant improvement since switching from sulfasalazine to methotrexate.  She is taking methotrexate 8 tablets by mouth once weekly and folic acid 2 mg by mouth daily.  She denied experiencing any GI side effects like she had in the past.  She is experiencing significant fatigue.  She will continue on his current treatment regimen.  She does not need any refills at this time.  She was advised to notify us that shows increased joint pain or joint swelling.  She will follow-up in the office in 5 months.  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   High risk medication use - Methotrexate 8 tablets weekly and folic acid 2 mg tablets daily. D/c SSZ due to inadequate response-recurrent flares of scleritis. Most recent CBC/CMP within normal limits on 04/01/2018 and will monitor every 3 months.  Standing orders are in place.  Bilateral scleritis: She has had 1 flare in the right eye since switching from SSZ to MTX.  She is asymptomatic at this time.  She is followed by [facility].   Primary osteoarthritis of both hands: She has no  tenderness or synovitis at this time.  Joint protection and muscle strengthening were discussed.   Primary osteoarthritis of both knees: No warmth or effusion of knee joints.  She has good ROM with no discomfort.   Primary osteoarthritis of both feet: She has no discomfort at this time.   Positive PPD, treated: She gets yearly chest x-rays.  CXR on 02/12/18 did not show active cardiopulmonary disease.   Smoker   Orders: No orders of the defined types were placed in this encounter.  No orders of the defined types were placed in this encounter.   Follow-Up Instructions: Return in about 5 months (around 10/14/2018) for Rheumatoid arthritis, Discoid lupus.   Ofilia Neas, PA-C  Note - This record has been created using Dragon software.  Chart creation errors have been sought, but may not always  have been located. Such creation errors do not reflect on  the standard of medical care.

## 2018-05-14 ENCOUNTER — Ambulatory Visit: Payer: 59 | Admitting: Physician Assistant

## 2018-05-14 ENCOUNTER — Encounter: Payer: Self-pay | Admitting: Physician Assistant

## 2018-05-14 VITALS — BP 131/87 | HR 70 | Resp 12 | Ht 66.0 in | Wt 176.4 lb

## 2018-05-14 DIAGNOSIS — Z79899 Other long term (current) drug therapy: Secondary | ICD-10-CM

## 2018-05-14 DIAGNOSIS — M19072 Primary osteoarthritis, left ankle and foot: Secondary | ICD-10-CM

## 2018-05-14 DIAGNOSIS — M0579 Rheumatoid arthritis with rheumatoid factor of multiple sites without organ or systems involvement: Secondary | ICD-10-CM | POA: Diagnosis not present

## 2018-05-14 DIAGNOSIS — R7611 Nonspecific reaction to tuberculin skin test without active tuberculosis: Secondary | ICD-10-CM

## 2018-05-14 DIAGNOSIS — H15003 Unspecified scleritis, bilateral: Secondary | ICD-10-CM

## 2018-05-14 DIAGNOSIS — M0630 Rheumatoid nodule, unspecified site: Secondary | ICD-10-CM

## 2018-05-14 DIAGNOSIS — L930 Discoid lupus erythematosus: Secondary | ICD-10-CM | POA: Diagnosis not present

## 2018-05-14 DIAGNOSIS — M19041 Primary osteoarthritis, right hand: Secondary | ICD-10-CM

## 2018-05-14 DIAGNOSIS — M19071 Primary osteoarthritis, right ankle and foot: Secondary | ICD-10-CM

## 2018-05-14 DIAGNOSIS — M19042 Primary osteoarthritis, left hand: Secondary | ICD-10-CM

## 2018-05-14 DIAGNOSIS — F17200 Nicotine dependence, unspecified, uncomplicated: Secondary | ICD-10-CM

## 2018-05-14 DIAGNOSIS — M170 Bilateral primary osteoarthritis of knee: Secondary | ICD-10-CM

## 2018-05-14 NOTE — Patient Instructions (Signed)
Standing Labs We placed an order today for your standing lab work.    Please come back and get your standing labs in April and every 3 months  We have open lab Monday through Friday from 8:30-11:30 AM and 1:30-4:00 PM  at the office of Dr. Shaili Deveshwar.   You may experience shorter wait times on Monday and Friday afternoons. The office is located at [address], [address], [place], [place] [address] No appointment is necessary.   Labs are drawn by [facility].  You may receive a bill from [facility] for your lab work.  If you wish to have your labs drawn at another location, please call the office 24 hours in advance to send orders.  If you have any questions regarding directions or hours of operation,  please call [phone].   Just as a reminder please drink plenty of water prior to coming for your lab work. Thanks!  

## 2018-06-01 ENCOUNTER — Other Ambulatory Visit: Payer: Self-pay | Admitting: Rheumatology

## 2018-06-01 NOTE — Telephone Encounter (Signed)
Last Visit: 05/14/2018 Next Visit: 10/15/2018 Labs: 04/01/2018 WNL   Okay to refill per Dr. Estanislado Pandy.

## 2018-07-06 ENCOUNTER — Telehealth: Payer: Self-pay | Admitting: Rheumatology

## 2018-07-06 DIAGNOSIS — Z79899 Other long term (current) drug therapy: Secondary | ICD-10-CM

## 2018-07-06 NOTE — Telephone Encounter (Signed)
Lab orders have been released for [facility].  

## 2018-07-06 NOTE — Telephone Encounter (Signed)
Patient requested her labwork orders be sent to [facility] on [address].  Patient states she plans to go tomorrow morning 07/07/18.

## 2018-07-09 DIAGNOSIS — Z79899 Other long term (current) drug therapy: Secondary | ICD-10-CM | POA: Diagnosis not present

## 2018-07-10 LAB — COMPLETE METABOLIC PANEL WITH GFR
AG Ratio: 1.7 (calc) (ref 1.0–2.5)
ALT: 11 U/L (ref 6–29)
AST: 16 U/L (ref 10–35)
Albumin: 4.3 g/dL (ref 3.6–5.1)
Alkaline phosphatase (APISO): 52 U/L (ref 37–153)
BUN: 14 mg/dL (ref 7–25)
CO2: 27 mmol/L (ref 20–32)
Calcium: 9.6 mg/dL (ref 8.6–10.4)
Chloride: 106 mmol/L (ref 98–110)
Creat: 0.76 mg/dL (ref 0.50–1.05)
GFR, Est African American: 102 mL/min/{1.73_m2} (ref >=60)
GFR, Est Non African American: 88 mL/min/{1.73_m2} (ref >=60)
Globulin: 2.6 g/dL (calc) (ref 1.9–3.7)
Glucose, Bld: 79 mg/dL (ref 65–99)
Potassium: 4.2 mmol/L (ref 3.5–5.3)
Sodium: 140 mmol/L (ref 135–146)
Total Bilirubin: 0.3 mg/dL (ref 0.2–1.2)
Total Protein: 6.9 g/dL (ref 6.1–8.1)

## 2018-07-10 LAB — CBC WITH DIFFERENTIAL/PLATELET
Absolute Monocytes: 326 cells/uL (ref 200–950)
Basophils Absolute: 29 cells/uL (ref 0–200)
Basophils Relative: 0.6 %
Eosinophils Absolute: 346 cells/uL (ref 15–500)
Eosinophils Relative: 7.2 %
HCT: 37 % (ref 35.0–45.0)
Hemoglobin: 12.5 g/dL (ref 11.7–15.5)
Lymphs Abs: 2093 cells/uL (ref 850–3900)
MCH: 31.3 pg (ref 27.0–33.0)
MCHC: 33.8 g/dL (ref 32.0–36.0)
MCV: 92.5 fL (ref 80.0–100.0)
MPV: 11.3 fL (ref 7.5–12.5)
Monocytes Relative: 6.8 %
Neutro Abs: 2006 cells/uL (ref 1500–7800)
Neutrophils Relative %: 41.8 %
Platelets: 227 10*3/uL (ref 140–400)
RBC: 4 10*6/uL (ref 3.80–5.10)
RDW: 13.4 % (ref 11.0–15.0)
Total Lymphocyte: 43.6 %
WBC: 4.8 10*3/uL (ref 3.8–10.8)

## 2018-07-16 ENCOUNTER — Ambulatory Visit: Payer: 59 | Admitting: Podiatry

## 2018-07-16 ENCOUNTER — Other Ambulatory Visit: Payer: Self-pay | Admitting: Podiatry

## 2018-07-16 ENCOUNTER — Ambulatory Visit (INDEPENDENT_AMBULATORY_CARE_PROVIDER_SITE_OTHER): Payer: 59

## 2018-07-16 ENCOUNTER — Other Ambulatory Visit: Payer: Self-pay

## 2018-07-16 ENCOUNTER — Encounter: Payer: Self-pay | Admitting: Podiatry

## 2018-07-16 VITALS — BP 118/75 | HR 78 | Temp 97.9°F

## 2018-07-16 DIAGNOSIS — M779 Enthesopathy, unspecified: Secondary | ICD-10-CM

## 2018-07-16 DIAGNOSIS — L309 Dermatitis, unspecified: Secondary | ICD-10-CM

## 2018-07-16 DIAGNOSIS — M79671 Pain in right foot: Secondary | ICD-10-CM

## 2018-07-16 DIAGNOSIS — M79672 Pain in left foot: Secondary | ICD-10-CM | POA: Diagnosis not present

## 2018-07-16 DIAGNOSIS — L600 Ingrowing nail: Secondary | ICD-10-CM | POA: Diagnosis not present

## 2018-07-16 MED ORDER — NEOMYCIN-POLYMYXIN-HC 3.5-10000-1 OT SOLN: OTIC | 0 refills | Status: DC

## 2018-07-16 MED ORDER — TERBINAFINE HCL 250 MG PO TABS: 250.0000 mg | ORAL_TABLET | Freq: Every day | ORAL | 0 refills | Status: DC

## 2018-07-16 NOTE — Patient Instructions (Signed)

## 2018-07-16 NOTE — Progress Notes (Signed)
Subjective:   Patient ID: Samantha Hughes, female   DOB: 58 y.o.   MRN: [id number]   HPI Patient states that she is a chronic ingrown toenails of both big toes and they get sore and make it hard for her to wear shoe gear properly.  Also complains of blisters on her feet which occur and that at times will pop and occasional itching with dry skin and some nail darkness along with rheumatoid arthritis with foot pain if she is on her feet for too long a time.  Patient smokes half pack per day and would like to be more active   Review of Systems  All other systems reviewed and are negative.       Objective:  Physical Exam Vitals signs and nursing note reviewed.  Constitutional:      Appearance: She is well-developed.  Pulmonary:     Effort: Pulmonary effort is normal.  Musculoskeletal: Normal range of motion.  Skin:    General: Skin is warm.  Neurological:     Mental Status: She is alert.     Neurovascular status found to be intact muscle strength is adequate range of motion within normal limits.  Patient is found to have incurvated medial border right hallux lateral border left hallux that are tender and painful with structural changes of the nailbeds with no redness or drainage noted.  Patient has mild discomfort in both feet with keratotic lesion sub-1 sub-5 bilateral and does have some discoloration of the plantar feet with a moccasin appearance with possibility for fungal infection.  Patient does get routine blood work with her rheumatoid arthritis and has good liver function and was found to have good digital perfusion     Assessment:  Chronic ingrown toenail deformity hallux bilateral with pain along with possibility for fungal infection feet bilateral and rheumatoid arthritis with chronic foot discomfort     Plan:  H&P conditions and everything reviewed with patient.  I recommended nail correction explaining procedures the patient and patient wants to get the foot fixed  understanding the risk of ingrown toenail surgery and signed consent form.  I infiltrated each hallux 60 mg like Marcaine mixture remove the medial border right hallux lateral border left hallux I removed the nail corners exposed matrix and applied phenol 3 applications 30 seconds followed by alcohol lavage sterile dressing and gave instructions for soaks and instructed to leave dressings on for 24 hours and to take them off at that time or earlier if any throbbing were to occur.  Wrote for drops discussed her x-rays recommended supportive shoes and did start her on antifungal Lamisil 1 pill a day for 45 days and will reevaluate her again in 10 weeks or earlier if needed  X-rays indicate no signs of advanced rheumatoid arthritis in both feet with moderate flatfoot deformity and no other pathology noted

## 2018-08-17 ENCOUNTER — Telehealth: Payer: Self-pay | Admitting: *Deleted

## 2018-08-17 NOTE — Telephone Encounter (Signed)
I asked pt if the left toe had increased redness, drainage or swelling, and pt states she has continued pain and oozing, but no redness or swelling. I told pt to perform the salt water soak daily and apply 1 drop then cover with a bandaid and then allow to air dry at night could even perform the soak at night but no drops and allow to air dry, and call Wednesday with status. Pt states understanding.

## 2018-08-17 NOTE — Telephone Encounter (Signed)
Pt states she had both big toenail procedure 3-4 weeks ago and the left has not healed.

## 2018-08-25 ENCOUNTER — Other Ambulatory Visit: Payer: Self-pay | Admitting: Rheumatology

## 2018-08-25 NOTE — Telephone Encounter (Signed)
Last Visit: 05/14/2018 Next Visit: 10/15/2018 Labs: 07/09/2018 CBC and CMP are WNL  Okay to refill per Dr. Estanislado Pandy.

## 2018-08-26 ENCOUNTER — Encounter (HOSPITAL_COMMUNITY): Payer: Self-pay | Admitting: Emergency Medicine

## 2018-08-26 ENCOUNTER — Emergency Department (HOSPITAL_COMMUNITY)
Admission: EM | Admit: 2018-08-26 | Discharge: 2018-08-27 | Disposition: A | Discharge status: Home / Self Care | Payer: 59 | Attending: Emergency Medicine | Admitting: Emergency Medicine

## 2018-08-26 ENCOUNTER — Other Ambulatory Visit: Payer: Self-pay

## 2018-08-26 DIAGNOSIS — F17210 Nicotine dependence, cigarettes, uncomplicated: Secondary | ICD-10-CM | POA: Diagnosis not present

## 2018-08-26 DIAGNOSIS — R220 Localized swelling, mass and lump, head: Secondary | ICD-10-CM

## 2018-08-26 DIAGNOSIS — L089 Local infection of the skin and subcutaneous tissue, unspecified: Secondary | ICD-10-CM

## 2018-08-26 DIAGNOSIS — Z79899 Other long term (current) drug therapy: Secondary | ICD-10-CM | POA: Insufficient documentation

## 2018-08-26 DIAGNOSIS — J3489 Other specified disorders of nose and nasal sinuses: Secondary | ICD-10-CM | POA: Diagnosis present

## 2018-08-26 MED ORDER — AMOXICILLIN-POT CLAVULANATE 875-125 MG PO TABS: 1.0000 | ORAL_TABLET | Freq: Two times a day (BID) | ORAL | 0 refills | Status: AC

## 2018-08-26 MED ORDER — PREDNISONE 10 MG PO TABS: ORAL_TABLET | ORAL | 0 refills | Status: AC

## 2018-08-26 NOTE — Discharge Instructions (Signed)
Thank you for allowing me to care for you today in the Emergency Department.   Call Dr. Janace Hoard to schedule a follow-up with ear nose and throat regarding your nose pain.  Follow-up with [facility] regarding the pain in your toe.  For the swelling in your nose, take prednisone as directed.  Make sure to take the entire course.  Take 1 tablet of Augmentin 2 times daily for the next 10 days.  You can apply an ice pack to your nose for 15 to 20 minutes as frequently as needed.  You can also take 650 mg of Tylenol once every 6 hours to help with pain.  You should return to the emergency department if you develop significant pain with moving your eyes, if you develop high fevers, changes in your vision, a nosebleed that you are unable to get stopped at home, or other new, concerning symptoms.

## 2018-08-26 NOTE — ED Provider Notes (Signed)
[facility] EMERGENCY DEPARTMENT Provider Note   CSN: [id number] Arrival date & time: 08/26/18  2136     History   Chief Complaint Chief Complaint  Patient presents with  . Nose Pain    HPI Samantha Hughes is a 58 y.o. female with a h/o of RA, OA, and depression who presents to the emergency department with a chief complaint of nose pain and swelling that began 4 days ago.  No recent trauma, injuries.  She reports the pain and swelling has been gradually worsening since onset.  She reports the nose is tender to the touch.  Yesterday, she was holding her young grandson when he touched her face and sent shooting pain up her forehead.  She denies visual changes, pain with movement of her eyes, fever, chills, rhinorrhea, nasal congestion, sinus pain or pressure, dental pain, sore throat, pain or swelling to the ears, or epistaxis.  She reports that she has had numerous sinus infections in the past and has been taking her home Flonase, but states that the current symptoms do not feel consistent with prior infections.  She also reports that she had an ingrown toenail partially cut within the last week, but has started to have a small amount of purulent drainage from the area on the left great toe and worsening pain.  She called her podiatrist earlier today to schedule follow-up appointment, but did not receive a call back prior to coming to the ER.     The history is provided by the patient. No language interpreter was used.    Past Medical History:  Diagnosis Date  . Depression   . Osteoarthritis   . Rheumatoid arthritis [facility])     Patient Active Problem List   Diagnosis Date Noted  . Smoker 07/08/2016  . Rheumatoid arthritis with rheumatoid factor of multiple sites without organ or systems involvement ([place]) 02/06/2016  . Discoid lupus 02/06/2016  . Primary osteoarthritis of both hands 02/06/2016  . Primary osteoarthritis of both knees 02/06/2016  . Rheumatoid  nodulosis ([place]) 02/06/2016  . High risk medication use 02/06/2016  . Positive PPD, treated 02/06/2016    Past Surgical History:  Procedure Laterality Date  . eye lift    . TUBAL LIGATION       OB History   No obstetric history on file.      Home Medications    Prior to Admission medications   Medication Sig Start Date End Date Taking? Authorizing Provider  B Complex CAPS Take 1 capsule by mouth daily.    [provider]  celecoxib (CELEBREX) 200 MG capsule TAKE ONE CAPSULE BY MOUTH TWICE A DAY AS NEEDED 03/13/17   Bo Merino, MD  estradiol (ESTRACE) 1 MG tablet Take 1 mg by mouth daily. 12/04/15   [provider]  folic acid (FOLVITE) 1 MG tablet Take 2 tablets (2 mg total) by mouth daily. 02/16/18   Bo Merino, MD  medroxyPROGESTERone (PROVERA) 2.5 MG tablet Take 2.5 mg by mouth daily. 01/06/16   [provider]  methotrexate (RHEUMATREX) 2.5 MG tablet TAKE 8 TABLETS (20 MG TOTAL) BY MOUTH ONCE A WEEK. CAUTION:CHEMOTHERAPY. PROTECT FROM LIGHT 08/25/18   Bo Merino, MD  methyldopa-hydrochlorothiazide (ALDORIL-25) 250-25 MG tablet Take 1 tablet by mouth 3 (three) times daily.    [provider]  Multiple Vitamin (MULTIVITAMIN WITH MINERALS) TABS tablet Take 1 tablet by mouth daily.    [provider]  neomycin-polymyxin-hydrocortisone (CORTISPORIN) OTIC solution Apply 1-2 drops to toe  after soaking twice a day 07/16/18   Wallene Huh, DPM  prednisoLONE acetate (PRED FORTE) 1 % ophthalmic suspension INSTILL 1 DROP INTO THE RIGHT EYE EVERY 2 HOURS AS DIRECTED 01/30/18   [provider]  Progesterone Micronized (PROGESTERONE PO) by Misc.(Non-Drug; Combo Route) route.    [provider]  terbinafine (LAMISIL) 250 MG tablet Take 1 tablet (250 mg total) by mouth daily. 07/16/18   Wallene Huh, DPM  venlafaxine XR (EFFEXOR-XR) 75 MG 24 hr capsule Take 75 mg by mouth daily. 12/08/15   [provider]     Family History Family History  Problem Relation Age of Onset  . Diabetes Mother   . Hypertension Mother   . Diabetes Sister   . Diabetes Maternal Grandmother   . Cancer Maternal Grandfather     Social History Social History   Tobacco Use  . Smoking status: Current Every Day Smoker    Packs/day: 0.50    Years: 30.00    Pack years: 15.00    Types: Cigarettes  . Smokeless tobacco: Never Used  Substance Use Topics  . Alcohol use: Yes    Comment: Socially   . Drug use: No     Allergies   Arava [leflunomide] and Plaquenil [hydroxychloroquine sulfate]   Review of Systems Review of Systems  Constitutional: Negative for activity change, chills and fever.  HENT: Positive for facial swelling. Negative for congestion, ear discharge, ear pain, nosebleeds, rhinorrhea, sinus pressure, sinus pain, sneezing, sore throat, tinnitus and trouble swallowing.   Respiratory: Negative for shortness of breath.   Cardiovascular: Negative for chest pain.  Gastrointestinal: Negative for abdominal pain and vomiting.  Genitourinary: Negative for dysuria.  Musculoskeletal: Negative for back pain.  Skin: Positive for wound. Negative for rash.  Allergic/Immunologic: Negative for immunocompromised state.  Neurological: Negative for dizziness, weakness and headaches.  Psychiatric/Behavioral: Negative for confusion.     Physical Exam Updated Vital Signs BP (!) 154/92 (BP Location: Right Arm)   Pulse 84   Temp 98.3 F (36.8 C) (Oral)   Resp 16   Ht 5\' 6"  (1.676 m)   Wt 77.1 kg   SpO2 100%   BMI 27.44 kg/m   Physical Exam Vitals signs and nursing note reviewed.  Constitutional:      General: She is not in acute distress. HENT:     Head: Normocephalic.     Nose: No nasal deformity, septal deviation, signs of injury, laceration, congestion or rhinorrhea.     Right Nostril: No foreign body, epistaxis, septal hematoma or occlusion.     Left Nostril: No foreign body, epistaxis, septal  hematoma or occlusion.     Right Turbinates: Not enlarged or swollen.     Left Turbinates: Not enlarged or swollen.     Right Sinus: No maxillary sinus tenderness or frontal sinus tenderness.     Left Sinus: No maxillary sinus tenderness or frontal sinus tenderness.      Comments: Tender to palpation throughout the cartilaginous areas of the nose.  No associated maxillary sinus tenderness with palpation.  No septal hematoma.  Glabella is nontender.  No tenderness extending beyond the cartilage.  No pain, tenderness, or swelling to the bilateral ears. Eyes:     Conjunctiva/sclera: Conjunctivae normal.  Neck:     Musculoskeletal: Neck supple.  Cardiovascular:     Rate and Rhythm: Normal rate and regular rhythm.     Heart sounds: No murmur. No friction rub. No gallop.   Pulmonary:  Effort: Pulmonary effort is normal. No respiratory distress.  Abdominal:     General: There is no distension.     Palpations: Abdomen is soft.  Musculoskeletal:     Comments: Partially avulsed toenail to the left great toe.  Small amount of purulent drainage is able to be expressed from the area.  She is moderately tender to palpation to the area surrounding the nail bed.  Minimal fluctuance.  No obvious paronychia or abscess.  Skin:    General: Skin is warm.     Findings: No rash.  Neurological:     Mental Status: She is alert.  Psychiatric:        Behavior: Behavior normal.      ED Treatments / Results  Labs (all labs ordered are listed, but only abnormal results are displayed) Labs Reviewed - No data to display  EKG    Radiology No results found.  Procedures Procedures (including critical care time)  Medications Ordered in ED Medications - No data to display   Initial Impression / Assessment and Plan / ED Course  I have reviewed the triage vital signs and the nursing notes.  Pertinent labs & imaging results that were available during my care of the patient were reviewed by me and  considered in my medical decision making (see chart for details).        58 year old female with a history of RA, OA, and depression presenting with nasal pain and swelling for the last 4 days.  She is having no other upper respiratory symptoms and no sinus pain or pressure.  No constitutional symptoms.  On exam, there is a localized tenderness and edema cartilaginous areas of the nose.  No septal hematoma.  No erythema or warmth.  Given history of RA, and concern for chondritis of the nose.  This possibly could be an atypical presentation of a maxillary sinus infection so will cover the patient with a course of Augmentin since she is also having purulent drainage from from recent partial removal of evident drain toenail.  I will cover her with prednisone and provide her with a referral to ENT.  No red flags on exam.  She is hemodynamically stable and in no acute distress.  Safe to discharge to home with outpatient follow-up.  Final Clinical Impressions(s) / ED Diagnoses   Final diagnoses:  None    ED Discharge Orders    None       Nakai Pollio A, PA-C 08/26/18 [place], [place], DO 08/27/18 2304

## 2018-08-26 NOTE — ED Triage Notes (Signed)
Pt in POV, presents with multiple complaints. Reports nose/facial pain X few days, denies any injury. Also reports she thinks her ingrown toenail is infected.

## 2018-08-27 ENCOUNTER — Other Ambulatory Visit: Payer: Self-pay | Admitting: Rheumatology

## 2018-08-28 NOTE — Telephone Encounter (Signed)
Patient advised her LFTs are normal.  She may take 1 capsule of Celebrex p.o. daily as needed only.  Okay to give her a 30-day supply with 1 refill. Patient verbalized understanding.

## 2018-08-28 NOTE — Telephone Encounter (Signed)
Her LFTs are normal.  She may take 1 capsule of Celebrex p.o. daily as needed only.  Okay to give her a 30-day supply with 1 refill.

## 2018-08-28 NOTE — Telephone Encounter (Signed)
Last Visit: 05/14/2018 Next Visit: 10/15/2018 Labs: 07/09/2018 CBC and CMP are WNL  Patient is on MTX.  Okay to refill Celebrex?

## 2018-09-09 ENCOUNTER — Ambulatory Visit: Payer: 59 | Admitting: Podiatry

## 2018-09-09 ENCOUNTER — Other Ambulatory Visit: Payer: Self-pay

## 2018-09-09 ENCOUNTER — Encounter: Payer: Self-pay | Admitting: Podiatry

## 2018-09-09 VITALS — Temp 97.4°F

## 2018-09-09 DIAGNOSIS — L03032 Cellulitis of left toe: Secondary | ICD-10-CM | POA: Diagnosis not present

## 2018-09-09 MED ORDER — DOXYCYCLINE HYCLATE 100 MG PO TABS: 100.0000 mg | ORAL_TABLET | Freq: Two times a day (BID) | ORAL | 0 refills | Status: DC

## 2018-09-09 NOTE — Progress Notes (Signed)
Subjective:   Patient ID: Samantha Hughes, female   DOB: 58 y.o.   MRN: [id number]   HPI Patient presents stating he is got redness and she has discomfort lateral border left hallux and went to the emergency room and was on an antibiotic   ROS      Objective:  Physical Exam  Neurovascular status intact with patient's hallux left on the lateral side showing mild proximal discoloration with crusted necrotic tissue localized with no proximal edema erythema or drainage to this point.  It is locally tender     Assessment:  Localized paronychia infection of the left hallux lateral border     Plan:  H&P condition reviewed and at this point I did anesthetize the hallux 60 mg like Marcaine mixture sterile instrumentation I clean the lateral border clean any necrotic tissue abscess tissue out did not note anything that I could culture currently with no pus formation and I then flushed the area out completely.  Applied sterile dressing and placed patient on doxycycline 100 mg twice daily for 10 days and gave strict instructions to come back in if any issues were to occur

## 2018-09-09 NOTE — Patient Instructions (Signed)

## 2018-09-16 ENCOUNTER — Telehealth: Payer: Self-pay | Admitting: *Deleted

## 2018-09-16 NOTE — Telephone Encounter (Signed)
Patient state she is on MTX 8 tabs weekly. 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 Patient states she had 15 positive cases of COVID. Patient states she has been working from home. Patient states she is concerned about her risk for COVID. Patient advised that the MTX compromises her immune system and that puts her at an increase risk. Patient advised if she could continue to work from home then that is what is recommended. Patient verbalized understanding.

## 2018-09-24 ENCOUNTER — Other Ambulatory Visit: Payer: Self-pay

## 2018-09-24 ENCOUNTER — Encounter: Payer: Self-pay | Admitting: Podiatry

## 2018-09-24 ENCOUNTER — Ambulatory Visit: Payer: 59 | Admitting: Podiatry

## 2018-09-24 VITALS — Temp 97.8°F

## 2018-09-24 DIAGNOSIS — L03032 Cellulitis of left toe: Secondary | ICD-10-CM | POA: Diagnosis not present

## 2018-09-24 DIAGNOSIS — M79671 Pain in right foot: Secondary | ICD-10-CM

## 2018-09-24 DIAGNOSIS — M79672 Pain in left foot: Secondary | ICD-10-CM

## 2018-09-24 NOTE — Progress Notes (Signed)
Subjective:   Patient ID: Samantha Hughes, female   DOB: 58 y.o.   MRN: [id number]   HPI Patient states it seems to be improved but I still get trouble in general with my nailbeds due to the structure of them   ROS      Objective:  Physical Exam  Neurovascular status intact with patient found to have continued structural changes bilateral with mycotic nail component bilateral with well-healed nail site left     Assessment:  Reviewed paronychia and do not recommend any other treatment but I did go ahead and I recommended continued soaks but no antibiotics.  I then went ahead debrided the nails to take pressure off F2     Plan:  Resolved paronychia infection with nail disease signed visit

## 2018-10-01 NOTE — Progress Notes (Signed)
Office Visit Note  Patient: Samantha Hughes             Date of Birth: 03/16/1960           MRN: [id number]             PCP: Kelton Pillar, MD Referring: Kelton Pillar, MD Visit Date: 10/15/2018 Occupation: @GUAROCC @  Subjective:  Right 5th MCP joint pain   History of Present Illness: Samantha Hughes is a 58 y.o. female with history of discoid lupus, seropositive rheumatoid arthritis, and osteoarthritis. She is taking Methotrexate 8 tablets po once weekly and folic acid 2 mg po daily.  She denies any recent rheumatoid arthritis flares.  She states that she has been under increased stress at work she is been having some increased arthralgias but denies any joint swelling.  She states that she has intermittent left knee joint pain at times.  She denies any recent injuries or falls.  She states she has also having some discomfort in the right fifth MCP joint.  She denies any active inflammation.  She continues to take Celebrex 200 mg 1 capsule by mouth as needed for pain relief.  She has not missed any doses of methotrexate recently.  She has not had any recent infections.  She denies any other joint pain or joint swelling.  She denies any recent scleritis flares.  She denies any eye pain or photophobia at this time.  She denies any eye floaters or eye dryness recently.  She denies any discoid lesions or rashes.  She continues to wear sunscreen SPF 50 on a daily basis.  She wears a hat if she plans on being outside.   Activities of Daily Living:  Patient reports morning stiffness for 5 minutes.   Patient Denies nocturnal pain.  Difficulty dressing/grooming: Denies Difficulty climbing stairs: Denies Difficulty getting out of chair: Denies Difficulty using hands for taps, buttons, cutlery, and/or writing: Denies  Review of Systems  Constitutional: Positive for fatigue.  HENT: Positive for mouth sores. Negative for mouth dryness and nose dryness.   Eyes: Negative for pain, visual  disturbance and dryness.  Respiratory: Negative for cough, hemoptysis, shortness of breath and difficulty breathing.   Cardiovascular: Negative for chest pain, palpitations, hypertension and swelling in legs/feet.  Gastrointestinal: Positive for constipation. Negative for blood in stool and diarrhea.  Endocrine: Negative for increased urination.  Genitourinary: Negative for painful urination.  Musculoskeletal: Positive for arthralgias, joint pain and morning stiffness. Negative for joint swelling, myalgias, muscle weakness, muscle tenderness and myalgias.  Skin: Positive for sensitivity to sunlight. Negative for color change, pallor, rash, hair loss, nodules/bumps, skin tightness and ulcers.  Allergic/Immunologic: Negative for susceptible to infections.  Neurological: Negative for dizziness, numbness, headaches and weakness.  Hematological: Negative for swollen glands.  Psychiatric/Behavioral: Negative for depressed mood and sleep disturbance. The patient is not nervous/anxious.     PMFS History:  Patient Active Problem List   Diagnosis Date Noted  . Smoker 07/08/2016  . 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 Rheumatoid arthritis with rheumatoid factor of multiple sites without organ or systems involvement ([place]) 02/06/2016  . Discoid lupus 02/06/2016  . Primary osteoarthritis of both hands 02/06/2016  . Primary osteoarthritis of both knees 02/06/2016  . Rheumatoid nodulosis ([place]) 02/06/2016  . High risk medication use 02/06/2016  . Positive PPD, treated 02/06/2016    Past Medical History:  Diagnosis Date  . Depression   . Osteoarthritis   . Rheumatoid  arthritis ([place])     Family History  Problem Relation Age of Onset  . Diabetes Mother   . Hypertension Mother   . Diabetes Sister   . Diabetes Maternal Grandmother   . Cancer Maternal Grandfather    Past Surgical History:  Procedure Laterality Date  . eye lift    . ingrown toenail Bilateral     bilateral great toes  . TUBAL LIGATION     Social History   Social History Narrative  . Not on file    There is no immunization history on file for this patient.   Objective: Vital Signs: BP (!) 150/89 (BP Location: Left Arm, Patient Position: Sitting, Cuff Size: Normal)   Pulse 66   Resp 13   Ht 5\' 6"  (1.676 m)   Wt 175 lb (79.4 kg)   BMI 28.25 kg/m    Physical Exam Vitals signs and nursing note reviewed.  Constitutional:      Appearance: She is well-developed.  HENT:     Head: Normocephalic and atraumatic.  Eyes:     Conjunctiva/sclera: Conjunctivae normal.  Neck:     Musculoskeletal: Normal range of motion.  Cardiovascular:     Rate and Rhythm: Normal rate and regular rhythm.     Heart sounds: Normal heart sounds.  Pulmonary:     Effort: Pulmonary effort is normal.     Breath sounds: Normal breath sounds.  Abdominal:     General: Bowel sounds are normal.     Palpations: Abdomen is soft.  Lymphadenopathy:     Cervical: No cervical adenopathy.  Skin:    General: Skin is warm and dry.     Capillary Refill: Capillary refill takes less than 2 seconds.  Neurological:     Mental Status: She is alert and oriented to person, place, and time.  Psychiatric:        Behavior: Behavior normal.      Musculoskeletal Exam: C-spine, thoracic spine, lumbar spine good range of motion.  No midline spinal tenderness.  No SI joint tenderness.  Shoulder joints, elbow joints, wrist joints, MCPs, PIPs, DIPs good range of motion no synovitis.  Complete fist formation bilaterally.  She has synovial thickening of the right second third MCP joints.  Hip joints, knee joints, ankle joints, MTPs, PIPs, DIPs good range of motion with no synovitis.  No warmth or effusion bilateral knee joints.  No tenderness or swelling of ankle joints.  CDAI Exam: CDAI Score: 0.2  Patient Global: 1 mm; Provider Global: 1 mm Swollen: 0 ; Tender: 0  Joint Exam   No joint exam has been documented for this  visit   There is currently no information documented on the homunculus. Go to the Rheumatology activity and complete the homunculus joint exam.  Investigation: No additional findings.  Imaging: No results found.  Recent Labs: Lab Results  Component Value Date   WBC 4.8 07/09/2018   HGB 12.5 07/09/2018   PLT 227 07/09/2018   NA 140 07/09/2018   K 4.2 07/09/2018   CL 106 07/09/2018   CO2 27 07/09/2018   GLUCOSE 79 07/09/2018   BUN 14 07/09/2018   CREATININE 0.76 07/09/2018   BILITOT 0.3 07/09/2018   ALKPHOS 50 10/07/2016   AST 16 07/09/2018   ALT 11 07/09/2018   PROT 6.9 07/09/2018   ALBUMIN 4.2 10/07/2016   CALCIUM 9.6 07/09/2018   GFRAA 102 07/09/2018   QFTBGOLDPLUS POSITIVE (A) 02/12/2018    Speciality Comments: No specialty comments available.  Procedures:  No procedures performed Allergies: Arava [leflunomide] and Plaquenil [hydroxychloroquine sulfate]   Assessment / Plan:     Visit Diagnoses: Discoid lupus - She has no discoid lesions.  She has not had any recent rashes.  She continues to have photosensitivity and wears sunscreen SPF 50 on a daily basis.  If she plans on being in the sun she wears a wide brim hat and sun protective clothing.  She will continue taking methotrexate as prescribed.  She was advised to notify us if she develops increased photosensitivity or discoid lesions.  She will follow-up in the office in 5 months.  Rheumatoid arthritis with rheumatoid factor of multiple sites without organ or systems involvement ([place]) - She has no synovitis on exam.  She has not had any recent rheumatoid arthritis flares.  She is clinically doing well on methotrexate 8 tablets by mouth once weekly and folic acid 2 mg by mouth daily.  She has not missed any doses of methotrexate recently.  She has been under increased stress at work which is been causing some increased arthralgias but she denies any joint inflammation.  She has intermittent left knee joint pain but has  good range of motion with no warmth or effusion on exam today.  She has been experiencing intermittent right fifth MCP joint pain but has no tenderness or synovitis on exam today.  She has complete fist formation bilaterally.  She will continue taking methotrexate 8 tablets by mouth once weekly and folic acid 2 mg by mouth daily.  She does not need any refills at this time.  She was advised to notify us if she develops increased joint pain or joint swelling.  She will follow-up in the office in 5 months.  Rheumatoid nodulosis ([place]) - Resolved.   High risk medication use - Methotrexate 8 tablets weekly and folic acid 2 mg tablets daily. D/c SSZ due to inadequate response-recurrent flares of scleritis.  CBC and CMP will be drawn today to monitor for drug toxicity.  She will return for lab work in November and every 3 months.  She was advised to hold methotrexate if she develops any signs or symptoms of an infection and to resume once the infection has completely cleared.  We discussed the importance of social distancing and following standard precautions recommended by the CDC.- Plan: CBC with Differential/Platelet, COMPLETE METABOLIC PANEL WITH GFR  Bilateral scleritis - She has not had any recent scleritis flares.  She has no eye pain, photophobia, conjunctival injection, or floaters.  Primary osteoarthritis of both hands -She has no tenderness or synovitis.  She has complete fist formation bilaterally.  Joint protection and muscle strengthening were discussed.  Primary osteoarthritis of both knees - She has good range of motion of bilateral knee joints with no discomfort.  No warmth or effusion was noted.  She has been having intermittent left knee joint pain but no inflammation was noted on exam today.  She has no difficulty climbing steps or getting up from a chair.  She takes Celebrex 200 mg 1 capsule by mouth daily as needed for pain relief.  Primary osteoarthritis of both feet - She has no feet  pain at this time.  She has no joint swelling.  She has no tenderness of MTP joints on exam.  She wears proper fitting shoes.  Positive PPD, treated - She gets yearly chest x-rays.  CXR on 02/12/18 did not show active cardiopulmonary disease   Smoker -  Orders: Orders Placed This Encounter  Procedures  .  CBC with Differential/Platelet  . COMPLETE METABOLIC PANEL WITH GFR   No orders of the defined types were placed in this encounter.     Follow-Up Instructions: Return in about 5 months (around 03/17/2019) for Rheumatoid arthritis, Discoid lupus, Osteoarthritis.   Ofilia Neas, PA-C  Note - This record has been created using Dragon software.  Chart creation errors have been sought, but may not always  have been located. Such creation errors do not reflect on  the standard of medical care.

## 2018-10-15 ENCOUNTER — Encounter: Payer: Self-pay | Admitting: Physician Assistant

## 2018-10-15 ENCOUNTER — Ambulatory Visit: Payer: 59 | Admitting: Physician Assistant

## 2018-10-15 ENCOUNTER — Other Ambulatory Visit: Payer: Self-pay

## 2018-10-15 VITALS — BP 150/89 | HR 66 | Resp 13 | Ht 66.0 in | Wt 175.0 lb

## 2018-10-15 DIAGNOSIS — Z79899 Other long term (current) drug therapy: Secondary | ICD-10-CM

## 2018-10-15 DIAGNOSIS — M19042 Primary osteoarthritis, left hand: Secondary | ICD-10-CM

## 2018-10-15 DIAGNOSIS — L930 Discoid lupus erythematosus: Secondary | ICD-10-CM

## 2018-10-15 DIAGNOSIS — M0579 Rheumatoid arthritis with rheumatoid factor of multiple sites without organ or systems involvement: Secondary | ICD-10-CM

## 2018-10-15 DIAGNOSIS — M0630 Rheumatoid nodule, unspecified site: Secondary | ICD-10-CM

## 2018-10-15 DIAGNOSIS — M19072 Primary osteoarthritis, left ankle and foot: Secondary | ICD-10-CM

## 2018-10-15 DIAGNOSIS — H15003 Unspecified scleritis, bilateral: Secondary | ICD-10-CM

## 2018-10-15 DIAGNOSIS — M19071 Primary osteoarthritis, right ankle and foot: Secondary | ICD-10-CM

## 2018-10-15 DIAGNOSIS — R7611 Nonspecific reaction to tuberculin skin test without active tuberculosis: Secondary | ICD-10-CM

## 2018-10-15 DIAGNOSIS — F17200 Nicotine dependence, unspecified, uncomplicated: Secondary | ICD-10-CM

## 2018-10-15 DIAGNOSIS — M19041 Primary osteoarthritis, right hand: Secondary | ICD-10-CM

## 2018-10-15 DIAGNOSIS — M170 Bilateral primary osteoarthritis of knee: Secondary | ICD-10-CM

## 2018-10-16 LAB — CBC WITH DIFFERENTIAL/PLATELET
Absolute Monocytes: 362 cells/uL (ref 200–950)
Basophils Absolute: 49 cells/uL (ref 0–200)
Basophils Relative: 0.9 %
Eosinophils Absolute: 270 cells/uL (ref 15–500)
Eosinophils Relative: 5 %
HCT: 37.9 % (ref 35.0–45.0)
Hemoglobin: 12.5 g/dL (ref 11.7–15.5)
Lymphs Abs: 1566 cells/uL (ref 850–3900)
MCH: 31.2 pg (ref 27.0–33.0)
MCHC: 33 g/dL (ref 32.0–36.0)
MCV: 94.5 fL (ref 80.0–100.0)
MPV: 11 fL (ref 7.5–12.5)
Monocytes Relative: 6.7 %
Neutro Abs: 3154 cells/uL (ref 1500–7800)
Neutrophils Relative %: 58.4 %
Platelets: 237 10*3/uL (ref 140–400)
RBC: 4.01 10*6/uL (ref 3.80–5.10)
RDW: 13.6 % (ref 11.0–15.0)
Total Lymphocyte: 29 %
WBC: 5.4 10*3/uL (ref 3.8–10.8)

## 2018-10-16 LAB — COMPLETE METABOLIC PANEL WITHOUT GFR
AG Ratio: 1.6 (calc) (ref 1.0–2.5)
ALT: 14 U/L (ref 6–29)
AST: 19 U/L (ref 10–35)
Albumin: 4.3 g/dL (ref 3.6–5.1)
Alkaline phosphatase (APISO): 47 U/L (ref 37–153)
BUN: 20 mg/dL (ref 7–25)
CO2: 27 mmol/L (ref 20–32)
Calcium: 9.6 mg/dL (ref 8.6–10.4)
Chloride: 108 mmol/L (ref 98–110)
Creat: 0.72 mg/dL (ref 0.50–1.05)
GFR, Est African American: 108 mL/min/1.73m2
GFR, Est Non African American: 94 mL/min/1.73m2
Globulin: 2.7 g/dL (ref 1.9–3.7)
Glucose, Bld: 79 mg/dL (ref 65–99)
Potassium: 4.6 mmol/L (ref 3.5–5.3)
Sodium: 141 mmol/L (ref 135–146)
Total Bilirubin: 0.3 mg/dL (ref 0.2–1.2)
Total Protein: 7 g/dL (ref 6.1–8.1)

## 2018-10-16 NOTE — Progress Notes (Signed)
CBC and CMP WNL

## 2018-11-06 ENCOUNTER — Other Ambulatory Visit (HOSPITAL_COMMUNITY)
Admission: RE | Admit: 2018-11-06 | Discharge: 2018-11-06 | Disposition: A | Discharge status: Home / Self Care | Payer: 59 | Source: Ambulatory Visit | Attending: Family Medicine | Admitting: Family Medicine

## 2018-11-06 ENCOUNTER — Other Ambulatory Visit: Payer: Self-pay | Admitting: Family Medicine

## 2018-11-06 DIAGNOSIS — Z124 Encounter for screening for malignant neoplasm of cervix: Secondary | ICD-10-CM | POA: Diagnosis not present

## 2018-11-11 LAB — CYTOLOGY - PAP: Diagnosis: NEGATIVE

## 2018-11-20 ENCOUNTER — Other Ambulatory Visit: Payer: Self-pay | Admitting: Rheumatology

## 2018-11-20 NOTE — Telephone Encounter (Signed)
Last Visit: 10/15/18 Next Visit: 03/18/19 Labs: 10/15/18 WNL  Okay to refill per Dr. Estanislado Pandy

## 2018-11-23 ENCOUNTER — Other Ambulatory Visit: Payer: Self-pay | Admitting: Rheumatology

## 2018-11-23 NOTE — Telephone Encounter (Signed)
Last Visit: 10/15/18 Next Visit: 03/18/19 Labs: 10/15/18 WNL  Okay to refill per Dr. Estanislado Pandy

## 2019-02-22 ENCOUNTER — Telehealth: Payer: Self-pay | Admitting: Rheumatology

## 2019-02-22 ENCOUNTER — Other Ambulatory Visit: Payer: Self-pay

## 2019-02-22 DIAGNOSIS — Z79899 Other long term (current) drug therapy: Secondary | ICD-10-CM

## 2019-02-22 NOTE — Telephone Encounter (Signed)
Patient called requesting labwork orders be sent to [place] on [address].  Patient states will be going today.  Patient is also requesting prescription refill of Methotrexate to be sent to [facility] at [address] [facility].

## 2019-02-22 NOTE — Telephone Encounter (Addendum)
Lab orders released for [facility].   Last Visit: 10/15/2018 Next Visit: 03/18/2019 Labs: 10/15/2018 CBC and CMP WNL.   Advised patient refill would be sent once lab results are received, patient verbalized understanding.

## 2019-02-23 ENCOUNTER — Other Ambulatory Visit: Payer: Self-pay | Admitting: Rheumatology

## 2019-02-23 NOTE — Telephone Encounter (Signed)
Last Visit: 10/15/2018 Next Visit: 03/18/2019  Okay to refill per Dr. Estanislado Pandy.

## 2019-02-24 LAB — CBC WITH DIFFERENTIAL/PLATELET
Absolute Monocytes: 400 cells/uL (ref 200–950)
Basophils Absolute: 38 cells/uL (ref 0–200)
Basophils Relative: 0.7 %
Eosinophils Absolute: 410 cells/uL (ref 15–500)
Eosinophils Relative: 7.6 %
HCT: 36.9 % (ref 35.0–45.0)
Hemoglobin: 12.2 g/dL (ref 11.7–15.5)
Lymphs Abs: 2354 cells/uL (ref 850–3900)
MCH: 31.1 pg (ref 27.0–33.0)
MCHC: 33.1 g/dL (ref 32.0–36.0)
MCV: 94.1 fL (ref 80.0–100.0)
MPV: 11 fL (ref 7.5–12.5)
Monocytes Relative: 7.4 %
Neutro Abs: 2198 cells/uL (ref 1500–7800)
Neutrophils Relative %: 40.7 %
Platelets: 233 10*3/uL (ref 140–400)
RBC: 3.92 10*6/uL (ref 3.80–5.10)
RDW: 12.9 % (ref 11.0–15.0)
Total Lymphocyte: 43.6 %
WBC: 5.4 10*3/uL (ref 3.8–10.8)

## 2019-02-24 LAB — COMPLETE METABOLIC PANEL WITH GFR
AG Ratio: 1.5 (calc) (ref 1.0–2.5)
ALT: 8 U/L (ref 6–29)
AST: 14 U/L (ref 10–35)
Albumin: 4.1 g/dL (ref 3.6–5.1)
Alkaline phosphatase (APISO): 50 U/L (ref 37–153)
BUN: 20 mg/dL (ref 7–25)
CO2: 28 mmol/L (ref 20–32)
Calcium: 10 mg/dL (ref 8.6–10.4)
Chloride: 107 mmol/L (ref 98–110)
Creat: 0.82 mg/dL (ref 0.50–1.05)
GFR, Est African American: 92 mL/min/{1.73_m2} (ref >=60)
GFR, Est Non African American: 79 mL/min/{1.73_m2} (ref >=60)
Globulin: 2.7 g/dL (calc) (ref 1.9–3.7)
Glucose, Bld: 101 mg/dL — ABNORMAL HIGH (ref 65–99)
Potassium: 4.3 mmol/L (ref 3.5–5.3)
Sodium: 143 mmol/L (ref 135–146)
Total Bilirubin: 0.3 mg/dL (ref 0.2–1.2)
Total Protein: 6.8 g/dL (ref 6.1–8.1)

## 2019-02-24 MED ORDER — METHOTREXATE 2.5 MG PO TABS: 20.0000 mg | ORAL_TABLET | ORAL | 0 refills | Status: DC

## 2019-02-24 NOTE — Addendum Note (Signed)
Addended by: Earnestine Mealing on: 02/24/2019 08:39 AM   Modules accepted: Orders

## 2019-02-24 NOTE — Telephone Encounter (Addendum)
Labs: 02/23/2019 CBC and CMP WNL  Okay to refill per Dr. Estanislado Pandy.   Advised patient that refill has been sent to the pharmacy.

## 2019-02-25 ENCOUNTER — Other Ambulatory Visit: Payer: Self-pay | Admitting: Rheumatology

## 2019-02-25 NOTE — Telephone Encounter (Signed)
Last Visit: 10/15/2018 Next Visit: 03/18/2019 Labs: 02/23/2019 CBC and CMP WNL  Okay to refill per Dr. Estanislado Pandy.

## 2019-03-10 ENCOUNTER — Telehealth: Payer: Self-pay

## 2019-03-10 MED ORDER — PREDNISONE 5 MG PO TABS: ORAL_TABLET | ORAL | 0 refills | Status: DC

## 2019-03-10 NOTE — Telephone Encounter (Signed)
Okay to give prescription for prednisone 5 mg tablets 4 tablets p.o. weekly and taper by 5 mg every 4 days.

## 2019-03-10 NOTE — Telephone Encounter (Signed)
Prednisone taper sent to the pharmacy, patient is aware.  

## 2019-03-10 NOTE — Telephone Encounter (Signed)
Patient states she is currently having a flare. She is experiencing pain and swelling in her hands, shoulders and knees. Patient has been taking MTX on schedule and has not missed any doses. Patient is requesting a prednisone taper. Please advise.

## 2019-03-17 NOTE — Progress Notes (Signed)
Virtual Visit via Video Note  I connected with Samantha Hughes on 03/18/19 at  8:45 AM EST by a video enabled telemedicine application and verified that I am speaking with the correct person using two identifiers.  Location: Patient: Home  Provider: Clinic  This service was conducted via virtual visit.  Both audio and visual tools were used.  The patient was located at home. I was located in my office.  Consent was obtained prior to the virtual visit and is aware of possible charges through their insurance for this visit.  The patient is an established patient.  Dr. Estanislado Pandy, MD conducted the virtual visit and Hazel Sams, PA-C acted as scribe during the service.  Office staff helped with scheduling follow up visits after the service was conducted.     I discussed the limitations of evaluation and management by telemedicine and the availability of in person appointments. The patient expressed understanding and agreed to proceed.  CC: Medication monitoring  History of Present Illness: Samantha Hughes is a 59 y.o. female with history of discoid lupus, seropositive rheumatoid arthritis, and osteoarthritis. She is taking Methotrexate 8 tablets po once weekly and folic acid 2 mg po daily. She had a RA flare on 03/10/19 in the left knee joint. She was started on a prednisone taper, which she completed today.  Her left knee joint pain and inflammation has resolved. She has no other joint pain or joint swelling currently. She has no discoid lesions.  She denies any recurrence of nodulosis.  She denies any scleritis flares since starting on MTX.   Review of Systems  Constitutional: Negative for fever and malaise/fatigue.  Eyes: Negative for photophobia, pain, discharge and redness.  Respiratory: Negative for cough, shortness of breath and wheezing.   Cardiovascular: Negative for chest pain, palpitations and leg swelling.  Gastrointestinal: Positive for constipation. Negative for blood in stool and  diarrhea.  Genitourinary: Negative for dysuria and frequency.  Musculoskeletal: Negative for back pain, joint pain, myalgias and neck pain.  Skin: Negative for rash.  Neurological: Negative for dizziness, weakness and headaches.  Endo/Heme/Allergies: Does not bruise/bleed easily.  Psychiatric/Behavioral: Negative for depression and memory loss. The patient is not nervous/anxious and does not have insomnia.      Observations/Objective:  Physical Exam  Constitutional: She is oriented to person, place, and time and well-developed, well-nourished, and in no distress.  HENT:  Head: Normocephalic and atraumatic.  Eyes: Conjunctivae are normal.  Pulmonary/Chest: Effort normal.  Neurological: She is alert and oriented to person, place, and time.  Psychiatric: Mood, memory, affect and judgment normal.     Patient reports morning stiffness for 0 NONE.   Patient denies nocturnal pain.  Difficulty dressing/grooming: Denies Difficulty climbing stairs: Denies Difficulty getting out of chair: Denies Difficulty using hands for taps, buttons, cutlery, and/or writing: Denies  Assessment and Plan: Visit Diagnoses: Discoid lupus - She has not any recent discoid lesions.  She has not had any recent rashes.  She is clinically doing well on MTX 8 tablets po once weekly and folic acid 2 mg po daily. She will continue on this current treatment regimen.    Rheumatoid arthritis with rheumatoid factor of multiple sites without organ or systems involvement [facility]): She had a rheumatoid arthritis flare in the left knee joint on 03/10/19. Her left knee joint pain and inflammation has resolved.  She has no other joint pain or joint swelling. She completed the prednisone taper today.  She has been under increased stress at  work, which she feels has been exacerbating her joint pain and stiffness. She is taking Methotrexate 8 tablets by mouth once weekly and folic acid 2 mg po daily.  She has no joint pain or joint  swelling currently.  No morning stiffness or nocturnal pain.  She will continue taking MTX and folic acid as prescribed.  She was advised to notify us if she develops increased joint pain or joint swelling.  She will follow up in 3 months.   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   High risk medication use - Methotrexate 8 tablets weekly and folic acid 2 mg tablets daily. D/c SSZ due to inadequate response-recurrent flares of scleritis.  CBC and CMP were drawn on 02/23/19.  She will be due to update lab work in March and every 3 months.  Standing orders are in place.   Bilateral scleritis: She has not had any recent scleritis flares.  She states her vision has actually seemed to improve.   She will continue taking MTX 8 tablets po once weekly and folic acid 2 mg po daily.    Primary osteoarthritis of both hands: She is not having any joint pain or inflammation at this time.   Primary osteoarthritis of both knees: She had a flare in the left knee joint on 03/10/19.  She was started on a prednisone taper and took her last dose today. Her left knee joint pain and inflammation has resolved.    Primary osteoarthritis of both feet: She has no feet pain or inflammation.   Positive PPD, treated - She gets yearly chest x-rays.  CXR on 02/12/18 did not show active cardiopulmonary disease   Smoker   Follow Up Instructions: She will follow up in 3 months    I discussed the assessment and treatment plan with the patient. The patient was provided an opportunity to ask questions and all were answered. The patient agreed with the plan and demonstrated an understanding of the instructions.   The patient was advised to call back or seek an in-person evaluation if the symptoms worsen or if the condition fails to improve as anticipated.  I provided 15 minutes of non-face-to-face time during this encounter.  Bo Merino, MD   Scribed by-  Hazel Sams, PA-C

## 2019-03-18 ENCOUNTER — Telehealth (INDEPENDENT_AMBULATORY_CARE_PROVIDER_SITE_OTHER): Payer: 59 | Admitting: Rheumatology

## 2019-03-18 ENCOUNTER — Encounter: Payer: Self-pay | Admitting: Rheumatology

## 2019-03-18 ENCOUNTER — Other Ambulatory Visit: Payer: Self-pay

## 2019-03-18 DIAGNOSIS — M19071 Primary osteoarthritis, right ankle and foot: Secondary | ICD-10-CM

## 2019-03-18 DIAGNOSIS — Z79899 Other long term (current) drug therapy: Secondary | ICD-10-CM

## 2019-03-18 DIAGNOSIS — F17200 Nicotine dependence, unspecified, uncomplicated: Secondary | ICD-10-CM

## 2019-03-18 DIAGNOSIS — M0630 Rheumatoid nodule, unspecified site: Secondary | ICD-10-CM

## 2019-03-18 DIAGNOSIS — R7611 Nonspecific reaction to tuberculin skin test without active tuberculosis: Secondary | ICD-10-CM

## 2019-03-18 DIAGNOSIS — M170 Bilateral primary osteoarthritis of knee: Secondary | ICD-10-CM

## 2019-03-18 DIAGNOSIS — L930 Discoid lupus erythematosus: Secondary | ICD-10-CM | POA: Diagnosis not present

## 2019-03-18 DIAGNOSIS — M0579 Rheumatoid arthritis with rheumatoid factor of multiple sites without organ or systems involvement: Secondary | ICD-10-CM

## 2019-03-18 DIAGNOSIS — M19072 Primary osteoarthritis, left ankle and foot: Secondary | ICD-10-CM

## 2019-03-18 DIAGNOSIS — M19041 Primary osteoarthritis, right hand: Secondary | ICD-10-CM

## 2019-03-18 DIAGNOSIS — M19042 Primary osteoarthritis, left hand: Secondary | ICD-10-CM

## 2019-03-18 DIAGNOSIS — H15003 Unspecified scleritis, bilateral: Secondary | ICD-10-CM

## 2019-04-01 ENCOUNTER — Telehealth: Payer: Self-pay | Admitting: Rheumatology

## 2019-04-01 NOTE — Telephone Encounter (Signed)
I LMOM for patient to call, and schedule a follow up appointment for 4 months (around 06/16/2019).

## 2019-04-01 NOTE — Telephone Encounter (Signed)
-----   Message from Shona Needles, RT sent at 03/18/2019  1:30 PM EST ----- Regarding: 3 MONTH F/U

## 2019-05-18 ENCOUNTER — Other Ambulatory Visit: Payer: Self-pay | Admitting: Rheumatology

## 2019-05-18 NOTE — Telephone Encounter (Signed)
Last Visit: 03/18/19 Next Visit: 06/21/19 Labs: 02/23/19 WNL  Patient reminded she will be due to update labs this month.   Okay to refill per Dr.Deveshwar

## 2019-05-25 ENCOUNTER — Other Ambulatory Visit: Payer: Self-pay

## 2019-05-25 DIAGNOSIS — Z79899 Other long term (current) drug therapy: Secondary | ICD-10-CM

## 2019-05-26 LAB — COMPLETE METABOLIC PANEL WITH GFR
AG Ratio: 1.6 (calc) (ref 1.0–2.5)
ALT: 10 U/L (ref 6–29)
AST: 16 U/L (ref 10–35)
Albumin: 3.8 g/dL (ref 3.6–5.1)
Alkaline phosphatase (APISO): 47 U/L (ref 37–153)
BUN: 19 mg/dL (ref 7–25)
CO2: 26 mmol/L (ref 20–32)
Calcium: 9.3 mg/dL (ref 8.6–10.4)
Chloride: 109 mmol/L (ref 98–110)
Creat: 0.91 mg/dL (ref 0.50–1.05)
GFR, Est African American: 81 mL/min/{1.73_m2} (ref >=60)
GFR, Est Non African American: 70 mL/min/{1.73_m2} (ref >=60)
Globulin: 2.4 g/dL (calc) (ref 1.9–3.7)
Glucose, Bld: 96 mg/dL (ref 65–99)
Potassium: 3.8 mmol/L (ref 3.5–5.3)
Sodium: 143 mmol/L (ref 135–146)
Total Bilirubin: 0.3 mg/dL (ref 0.2–1.2)
Total Protein: 6.2 g/dL (ref 6.1–8.1)

## 2019-05-26 LAB — CBC WITH DIFFERENTIAL/PLATELET
Absolute Monocytes: 347 cells/uL (ref 200–950)
Basophils Absolute: 41 cells/uL (ref 0–200)
Basophils Relative: 0.9 %
Eosinophils Absolute: 347 cells/uL (ref 15–500)
Eosinophils Relative: 7.7 %
HCT: 37.5 % (ref 35.0–45.0)
Hemoglobin: 12.3 g/dL (ref 11.7–15.5)
Lymphs Abs: 1427 cells/uL (ref 850–3900)
MCH: 31.3 pg (ref 27.0–33.0)
MCHC: 32.8 g/dL (ref 32.0–36.0)
MCV: 95.4 fL (ref 80.0–100.0)
MPV: 11.1 fL (ref 7.5–12.5)
Monocytes Relative: 7.7 %
Neutro Abs: 2340 cells/uL (ref 1500–7800)
Neutrophils Relative %: 52 %
Platelets: 240 10*3/uL (ref 140–400)
RBC: 3.93 10*6/uL (ref 3.80–5.10)
RDW: 13.5 % (ref 11.0–15.0)
Total Lymphocyte: 31.7 %
WBC: 4.5 10*3/uL (ref 3.8–10.8)

## 2019-05-26 NOTE — Progress Notes (Signed)
CBC and CMP are normal.

## 2019-06-16 NOTE — Progress Notes (Signed)
Office Visit Note  Patient: Samantha Hughes             Date of Birth: October 04, 1960           MRN: [id number]             PCP: Kelton Pillar, MD Referring: Kelton Pillar, MD Visit Date: 06/21/2019 Occupation: @GUAROCC @  Subjective:  Right hand pain   History of Present Illness: Samantha Hughes is a 59 y.o. female with history of seropositive rheumatoid arthritis, osteoarthritis, discoid lupus, and scleritis.  Patient is taking methotrexate 8 tablets by mouth once weekly and folic acid 2 mg by mouth daily.  She states that this past weekend she was working in the yard as spring cleaning which has caused some increased discomfort in the right hand.  She denies any joint swelling at this time.  She denies any increased frequency and flares.  She denies any other joint pain or joint swelling at this time.  She states that in the end of February she developed a flare of scleritis in the right eye.  She was evaluated by her ophthalmologist and prescribed steroid eyedrops which resolved her symptoms.  She states in the past year this was her second flare of scleritis.  She is not having any eye pain or inflammation at this time.  She has not had any recurrence of discoid lesions. She has received both covid-19 vaccinations.    Activities of Daily Living:  Patient reports morning stiffness for 0 none.   Patient Denies nocturnal pain.  Difficulty dressing/grooming: Denies Difficulty climbing stairs: Denies Difficulty getting out of chair: Denies Difficulty using hands for taps, buttons, cutlery, and/or writing: Denies  Review of Systems  Constitutional: Negative for fatigue.  HENT: Negative for mouth sores, mouth dryness and nose dryness.   Eyes: Negative for pain, visual disturbance and dryness.  Respiratory: Negative for cough, hemoptysis, shortness of breath and difficulty breathing.   Cardiovascular: Negative for chest pain, palpitations, hypertension and swelling in legs/feet.    Gastrointestinal: Positive for constipation. Negative for blood in stool and diarrhea.  Endocrine: Negative for increased urination.  Genitourinary: Negative for difficulty urinating and painful urination.  Musculoskeletal: Positive for arthralgias, joint pain and joint swelling. Negative for myalgias, muscle weakness, morning stiffness, muscle tenderness and myalgias.  Skin: Negative for color change, pallor, rash, hair loss, nodules/bumps, skin tightness, ulcers and sensitivity to sunlight.  Allergic/Immunologic: Negative for susceptible to infections.  Neurological: Negative for dizziness, numbness, headaches and weakness.  Hematological: Negative for swollen glands.  Psychiatric/Behavioral: Negative for depressed mood and sleep disturbance. The patient is not nervous/anxious.     PMFS History:  Patient Active Problem List   Diagnosis Date Noted  . Smoker 07/08/2016  . 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 Rheumatoid arthritis with rheumatoid factor of multiple sites without organ or systems involvement ([place]) 02/06/2016  . Discoid lupus 02/06/2016  . Primary osteoarthritis of both hands 02/06/2016  . Primary osteoarthritis of both knees 02/06/2016  . 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 High risk medication use 02/06/2016  . Positive PPD, treated 02/06/2016    Past Medical History:  Diagnosis Date  . Depression   . Osteoarthritis   . Rheumatoid arthritis ([place])     Family History  Problem Relation Age of Onset  . Diabetes Mother   . Hypertension Mother   . Diabetes Sister   . Diabetes Maternal Grandmother   . Cancer Maternal Grandfather  Past Surgical History:  Procedure Laterality Date  . eye lift    . ingrown toenail Bilateral    bilateral great toes  . TUBAL LIGATION     Social History   Social History Narrative  . Not on file    There is no immunization history on file for this patient.   Objective: Vital Signs: BP  130/80 (BP Location: Left Arm, Patient Position: Sitting, Cuff Size: Normal)   Pulse 78   Resp 16   Ht 5' 6.5" (1.689 m)   Wt 169 lb 6.4 oz (76.8 kg)   BMI 26.93 kg/m    Physical Exam Vitals and nursing note reviewed.  Constitutional:      Appearance: She is well-developed.  HENT:     Head: Normocephalic and atraumatic.  Eyes:     Conjunctiva/sclera: Conjunctivae normal.  Cardiovascular:     Rate and Rhythm: Normal rate and regular rhythm.     Heart sounds: Normal heart sounds.  Pulmonary:     Effort: Pulmonary effort is normal.     Breath sounds: Normal breath sounds.  Abdominal:     General: Bowel sounds are normal.     Palpations: Abdomen is soft.  Musculoskeletal:     Cervical back: Normal range of motion.  Lymphadenopathy:     Cervical: No cervical adenopathy.  Skin:    General: Skin is warm and dry.     Capillary Refill: Capillary refill takes less than 2 seconds.  Neurological:     Mental Status: She is alert and oriented to person, place, and time.  Psychiatric:        Behavior: Behavior normal.      Musculoskeletal Exam: C-spine, thoracic spine, and lumbar spine good ROM.  No midline spinal tenderness.  No SI joint tenderness.  Shoulder joints, elbow joints, wrist joints, MCPs, PIPs, and DIPs good ROM with no synovitis.  Complete fist formation bilaterally.  Tenderness of the right 3rd and 4th MCP joints.  Hip joints, knee joints, ankle joints, MTPs, PIPs, and DIPs good ROM with no synovitis.  No warmth or effusion of knee joints.  No tenderness or swelling of ankle joints.  Thickening of bilateral 1st MTP joints.   CDAI Exam: CDAI Score: 2.6  Patient Global: 3 mm; Provider Global: 3 mm Swollen: 0 ; Tender: 2  Joint Exam 06/21/2019      Right  Left  MCP 3   Tender     MCP 4   Tender        Investigation: No additional findings.  Imaging: No results found.  Recent Labs: Lab Results  Component Value Date   WBC 4.5 05/25/2019   HGB 12.3 05/25/2019     PLT 240 05/25/2019   NA 143 05/25/2019   K 3.8 05/25/2019   CL 109 05/25/2019   CO2 26 05/25/2019   GLUCOSE 96 05/25/2019   BUN 19 05/25/2019   CREATININE 0.91 05/25/2019   BILITOT 0.3 05/25/2019   ALKPHOS 50 10/07/2016   AST 16 05/25/2019   ALT 10 05/25/2019   PROT 6.2 05/25/2019   ALBUMIN 4.2 10/07/2016   CALCIUM 9.3 05/25/2019   GFRAA 81 05/25/2019   QFTBGOLDPLUS POSITIVE (A) 02/12/2018    Speciality Comments: No specialty comments available.  Procedures:  No procedures performed Allergies: Arava [leflunomide] and Plaquenil [hydroxychloroquine sulfate]   Assessment / Plan:     Visit Diagnoses: Rheumatoid arthritis with rheumatoid factor of multiple sites without organ or systems involvement ([place]): She has no synovitis on  exam.  She has tenderness of the right 3rd and 4th MCP joints. She has complete fist formation bilaterally. She was performing yard work and spring cleaning yesterday, which exacerbated her discomfort.  She is clinically doing well on Methotrexate 8 tablets by mouth once weekly and folic acid 2 mg po daily.  She will continue on the current treatment regimen. She does not need any refills of MTX at this time. She was advised to notify us if she develops increased joint pain or joint swelling. She will follow up in 5 months.  High risk medication use - Methotrexate 8 tablets weekly and folic acid 2 mg tablets daily. D/c SSZ due to inadequate response-recurrent flares of scleritis.  CBC and CMP were WNL on 05/25/19.  She will be due to update lab work in June and every 3 months.  Standing orders are in place. She has not had any recent infections.  She has received both covid-19 vaccines.   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  No recurrence.   Discoid lupus: She has not had any recent flares or recurrence of discoid lesions.    Bilateral scleritis: She had a flare in the right eye at the end of February 2021.  She was evaluated by her ophthalmologist and was  prescribed prednisone eye drops, which resolved her symptoms. According to the patient this was her second flare in over 1 year since switching from SSZ to MTX.  She was advised to notify us if she develops more frequent flares. She will continue taking MTX as prescribed.   Primary osteoarthritis of both hands: She no tenderness or inflammation on exam.  She has complete fist formation bilaterally. Joint protection and muscle strengthening were discussed.   Primary osteoarthritis of both knees: She has good ROM of both knee joints.  No warmth or effusion noted. She has no difficulty climbing steps or getting up from a seated position.   Primary osteoarthritis of both feet: She is not having any discomfort in her feet at this time.  She wears proper fitting shoes.  Positive PPD, treated - She gets yearly chest x-rays.  CXR on 02/12/18 did not show active cardiopulmonary disease.   Smoker  Orders: No orders of the defined types were placed in this encounter.  No orders of the defined types were placed in this encounter.     Follow-Up Instructions: Return in about 5 months (around 11/21/2019) for Rheumatoid arthritis, Osteoarthritis.   Ofilia Neas, PA-C  Note - This record has been created using Dragon software.  Chart creation errors have been sought, but may not always  have been located. Such creation errors do not reflect on  the standard of medical care.

## 2019-06-21 ENCOUNTER — Ambulatory Visit (INDEPENDENT_AMBULATORY_CARE_PROVIDER_SITE_OTHER): Payer: BC Managed Care – PPO | Admitting: Physician Assistant

## 2019-06-21 ENCOUNTER — Encounter: Payer: Self-pay | Admitting: Physician Assistant

## 2019-06-21 ENCOUNTER — Other Ambulatory Visit: Payer: Self-pay

## 2019-06-21 VITALS — BP 130/80 | HR 78 | Resp 16 | Ht 66.5 in | Wt 169.4 lb

## 2019-06-21 DIAGNOSIS — L930 Discoid lupus erythematosus: Secondary | ICD-10-CM | POA: Diagnosis not present

## 2019-06-21 DIAGNOSIS — M170 Bilateral primary osteoarthritis of knee: Secondary | ICD-10-CM

## 2019-06-21 DIAGNOSIS — M0579 Rheumatoid arthritis with rheumatoid factor of multiple sites without organ or systems involvement: Secondary | ICD-10-CM

## 2019-06-21 DIAGNOSIS — Z79899 Other long term (current) drug therapy: Secondary | ICD-10-CM | POA: Diagnosis not present

## 2019-06-21 DIAGNOSIS — F17200 Nicotine dependence, unspecified, uncomplicated: Secondary | ICD-10-CM

## 2019-06-21 DIAGNOSIS — R7611 Nonspecific reaction to tuberculin skin test without active tuberculosis: Secondary | ICD-10-CM

## 2019-06-21 DIAGNOSIS — H15003 Unspecified scleritis, bilateral: Secondary | ICD-10-CM

## 2019-06-21 DIAGNOSIS — M0630 Rheumatoid nodule, unspecified site: Secondary | ICD-10-CM

## 2019-06-21 DIAGNOSIS — M19041 Primary osteoarthritis, right hand: Secondary | ICD-10-CM

## 2019-06-21 DIAGNOSIS — M19071 Primary osteoarthritis, right ankle and foot: Secondary | ICD-10-CM

## 2019-06-21 DIAGNOSIS — M19072 Primary osteoarthritis, left ankle and foot: Secondary | ICD-10-CM

## 2019-06-21 DIAGNOSIS — M19042 Primary osteoarthritis, left hand: Secondary | ICD-10-CM

## 2019-06-21 NOTE — Patient Instructions (Signed)
Standing Labs We placed an order today for your standing lab work.    Please come back and get your standing labs in June and every 3 months.   We have open lab daily Monday through Thursday from 8:30-12:30 PM and 1:30-4:30 PM and Friday from 8:30-12:30 PM and 1:30-4:00 PM at the office of Dr. Shaili Deveshwar.   You may experience shorter wait times on Monday and Friday afternoons. The office is located at [address], [address], [place], [place] [address] No appointment is necessary.   Labs are drawn by [facility].  You may receive a bill from [facility] for your lab work.  If you wish to have your labs drawn at another location, please call the office 24 hours in advance to send orders.  If you have any questions regarding directions or hours of operation,  please call [phone].   Just as a reminder please drink plenty of water prior to coming for your lab work. Thanks!  

## 2019-08-06 ENCOUNTER — Other Ambulatory Visit: Payer: Self-pay

## 2019-08-06 DIAGNOSIS — Z79899 Other long term (current) drug therapy: Secondary | ICD-10-CM | POA: Diagnosis not present

## 2019-08-07 LAB — CBC WITH DIFFERENTIAL/PLATELET
Absolute Monocytes: 307 cells/uL (ref 200–950)
Basophils Absolute: 31 cells/uL (ref 0–200)
Basophils Relative: 0.6 %
Eosinophils Absolute: 281 cells/uL (ref 15–500)
Eosinophils Relative: 5.4 %
HCT: 38.7 % (ref 35.0–45.0)
Hemoglobin: 12.4 g/dL (ref 11.7–15.5)
Lymphs Abs: 2023 cells/uL (ref 850–3900)
MCH: 30.2 pg (ref 27.0–33.0)
MCHC: 32 g/dL (ref 32.0–36.0)
MCV: 94.4 fL (ref 80.0–100.0)
MPV: 10.9 fL (ref 7.5–12.5)
Monocytes Relative: 5.9 %
Neutro Abs: 2558 cells/uL (ref 1500–7800)
Neutrophils Relative %: 49.2 %
Platelets: 259 10*3/uL (ref 140–400)
RBC: 4.1 10*6/uL (ref 3.80–5.10)
RDW: 13.2 % (ref 11.0–15.0)
Total Lymphocyte: 38.9 %
WBC: 5.2 10*3/uL (ref 3.8–10.8)

## 2019-08-07 LAB — COMPLETE METABOLIC PANEL WITH GFR
AG Ratio: 1.7 (calc) (ref 1.0–2.5)
ALT: 9 U/L (ref 6–29)
AST: 17 U/L (ref 10–35)
Albumin: 4.1 g/dL (ref 3.6–5.1)
Alkaline phosphatase (APISO): 51 U/L (ref 37–153)
BUN: 13 mg/dL (ref 7–25)
CO2: 26 mmol/L (ref 20–32)
Calcium: 9.5 mg/dL (ref 8.6–10.4)
Chloride: 108 mmol/L (ref 98–110)
Creat: 0.72 mg/dL (ref 0.50–1.05)
GFR, Est African American: 108 mL/min/{1.73_m2} (ref >=60)
GFR, Est Non African American: 93 mL/min/{1.73_m2} (ref >=60)
Globulin: 2.4 g/dL (calc) (ref 1.9–3.7)
Glucose, Bld: 103 mg/dL — ABNORMAL HIGH (ref 65–99)
Potassium: 4 mmol/L (ref 3.5–5.3)
Sodium: 142 mmol/L (ref 135–146)
Total Bilirubin: 0.5 mg/dL (ref 0.2–1.2)
Total Protein: 6.5 g/dL (ref 6.1–8.1)

## 2019-08-08 NOTE — Progress Notes (Signed)
Glucose is mildly elevated, probably nonfasting.  All other labs are normal.

## 2019-08-13 ENCOUNTER — Other Ambulatory Visit: Payer: Self-pay | Admitting: Rheumatology

## 2019-08-13 NOTE — Telephone Encounter (Signed)
Last Visit: 06/21/2019 Next Visit: 11/23/2019 Labs: 08/06/2019 Glucose is mildly elevated, probably nonfasting. All other labs are normal.  Current Dose per office note 06/21/2019: Methotrexate 8 tablets weekly  Okay to refill per Dr. Deveshwar  

## 2019-09-22 DIAGNOSIS — J069 Acute upper respiratory infection, unspecified: Secondary | ICD-10-CM | POA: Diagnosis not present

## 2019-09-30 DIAGNOSIS — J0110 Acute frontal sinusitis, unspecified: Secondary | ICD-10-CM | POA: Diagnosis not present

## 2019-09-30 DIAGNOSIS — Z03818 Encounter for observation for suspected exposure to other biological agents ruled out: Secondary | ICD-10-CM | POA: Diagnosis not present

## 2019-11-01 DIAGNOSIS — Z1231 Encounter for screening mammogram for malignant neoplasm of breast: Secondary | ICD-10-CM | POA: Diagnosis not present

## 2019-11-03 ENCOUNTER — Other Ambulatory Visit: Payer: Self-pay | Admitting: Rheumatology

## 2019-11-03 NOTE — Telephone Encounter (Signed)
Last Visit: 06/21/2019 Next Visit: 11/23/2019 Labs: 08/06/2019 Glucose is mildly elevated, probably nonfasting. All other labs are normal.  Current Dose per office note 06/21/2019: Methotrexate 8 tablets weekly  Okay to refill per Dr. Estanislado Pandy

## 2019-11-08 DIAGNOSIS — H15011 Anterior scleritis, right eye: Secondary | ICD-10-CM | POA: Diagnosis not present

## 2019-11-09 NOTE — Progress Notes (Deleted)
Office Visit Note  Patient: Samantha Hughes             Date of Birth: 1960/07/11           MRN: [id number]             PCP: Kelton Pillar, MD Referring: Kelton Pillar, MD Visit Date: 11/23/2019 Occupation: @GUAROCC @  Subjective:  No chief complaint on file.   History of Present Illness: Samantha Hughes is a 59 y.o. female ***   Activities of Daily Living:  Patient reports morning stiffness for *** {minute/hour:19697}.   Patient {ACTIONS;DENIES/REPORTS:21021675::"Denies"} nocturnal pain.  Difficulty dressing/grooming: {ACTIONS;DENIES/REPORTS:21021675::"Denies"} Difficulty climbing stairs: {ACTIONS;DENIES/REPORTS:21021675::"Denies"} Difficulty getting out of chair: {ACTIONS;DENIES/REPORTS:21021675::"Denies"} Difficulty using hands for taps, buttons, cutlery, and/or writing: {ACTIONS;DENIES/REPORTS:21021675::"Denies"}  No Rheumatology ROS completed.   PMFS History:  Patient Active Problem List   Diagnosis Date Noted  . Smoker 07/08/2016  . 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 Rheumatoid arthritis with rheumatoid factor of multiple sites without organ or systems involvement ([place]) 02/06/2016  . Discoid lupus 02/06/2016  . Primary osteoarthritis of both hands 02/06/2016  . Primary osteoarthritis of both knees 02/06/2016  . 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 High risk medication use 02/06/2016  . Positive PPD, treated 02/06/2016    Past Medical History:  Diagnosis Date  . Depression   . Osteoarthritis   . Rheumatoid arthritis ([place])     Family History  Problem Relation Age of Onset  . Diabetes Mother   . Hypertension Mother   . Diabetes Sister   . Diabetes Maternal Grandmother   . Cancer Maternal Grandfather    Past Surgical History:  Procedure Laterality Date  . eye lift    . ingrown toenail Bilateral    bilateral great toes  . TUBAL LIGATION     Social History   Social History Narrative  . Not on file    There  is no immunization history on file for this patient.   Objective: Vital Signs: There were no vitals taken for this visit.   Physical Exam   Musculoskeletal Exam: ***  CDAI Exam: CDAI Score: -- Patient Global: --; Provider Global: -- Swollen: --; Tender: -- Joint Exam 11/23/2019   No joint exam has been documented for this visit   There is currently no information documented on the homunculus. Go to the Rheumatology activity and complete the homunculus joint exam.  Investigation: No additional findings.  Imaging: No results found.  Recent Labs: Lab Results  Component Value Date   WBC 5.2 08/06/2019   HGB 12.4 08/06/2019   PLT 259 08/06/2019   NA 142 08/06/2019   K 4.0 08/06/2019   CL 108 08/06/2019   CO2 26 08/06/2019   GLUCOSE 103 (H) 08/06/2019   BUN 13 08/06/2019   CREATININE 0.72 08/06/2019   BILITOT 0.5 08/06/2019   ALKPHOS 50 10/07/2016   AST 17 08/06/2019   ALT 9 08/06/2019   PROT 6.5 08/06/2019   ALBUMIN 4.2 10/07/2016   CALCIUM 9.5 08/06/2019   GFRAA 108 08/06/2019   QFTBGOLDPLUS POSITIVE (A) 02/12/2018    Speciality Comments: No specialty comments available.  Procedures:  No procedures performed Allergies: Arava [leflunomide] and Plaquenil [hydroxychloroquine sulfate]   Assessment / Plan:     Visit Diagnoses: No diagnosis found.  Orders: No orders of the defined types were placed in this encounter.  No orders of the defined types were placed in this encounter.   Face-to-face time spent with patient  was *** minutes. Greater than 50% of time was spent in counseling and coordination of care.  Follow-Up Instructions: No follow-ups on file.   Earnestine Mealing, CMA  Note - This record has been created using Editor, commissioning.  Chart creation errors have been sought, but may not always  have been located. Such creation errors do not reflect on  the standard of medical care.

## 2019-11-19 NOTE — Progress Notes (Signed)
Office Visit Note  Patient: Samantha Hughes             Date of Birth: 06/02/1960           MRN: [id number]             PCP: Kelton Pillar, MD Referring: Kelton Pillar, MD Visit Date: 11/22/2019 Occupation: @GUAROCC @  Subjective:  Right eye scleritis flare   History of Present Illness: Samantha Hughes is a 59 y.o. female seropositive rheumatoid arthritis, discoid lupus, and osteoarthritis.  She is taking methotrexate 8 tablets by mouth once weekly and folic acid 2 mg by mouth daily.  She denies any recent flares of rheumatoid arthritis.  She states that about 2 weeks ago she had a scleritis flare in her right eye.  She states that she contacted her ophthalmologist at [facility] and was sent in prednisone drops which she has completed.  She states that her flare has resolved.  She denies being under increased stress recently.  She denies missing any doses of methotrexate.  She states that this is the first flare of 2021.  She denies any other new concerns at this time.    Activities of Daily Living:  Patient reports morning stiffness for a few minutes.   Patient Denies nocturnal pain.  Difficulty dressing/grooming: Denies Difficulty climbing stairs: Denies Difficulty getting out of chair: Denies Difficulty using hands for taps, buttons, cutlery, and/or writing: Denies  Review of Systems  Constitutional: Negative for fatigue.  HENT: Positive for mouth sores. Negative for mouth dryness and nose dryness.   Eyes: Negative for pain, redness and dryness.  Respiratory: Negative for shortness of breath and difficulty breathing.   Cardiovascular: Negative for chest pain and palpitations.  Gastrointestinal: Positive for constipation. Negative for blood in stool and diarrhea.  Endocrine: Negative for increased urination.  Musculoskeletal: Positive for arthralgias, joint pain and morning stiffness. Negative for joint swelling, myalgias, muscle tenderness and myalgias.  Skin: Negative for  color change, rash and redness.  Allergic/Immunologic: Negative for susceptible to infections.  Neurological: Negative for dizziness, numbness, headaches, memory loss and weakness.  Hematological: Positive for bruising/bleeding tendency.  Psychiatric/Behavioral: Negative for confusion.    PMFS History:  Patient Active Problem List   Diagnosis Date Noted  . Smoker 07/08/2016  . 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 Rheumatoid arthritis with rheumatoid factor of multiple sites without organ or systems involvement ([place]) 02/06/2016  . Discoid lupus 02/06/2016  . Primary osteoarthritis of both hands 02/06/2016  . Primary osteoarthritis of both knees 02/06/2016  . 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 High risk medication use 02/06/2016  . Positive PPD, treated 02/06/2016    Past Medical History:  Diagnosis Date  . Depression   . Osteoarthritis   . Rheumatoid arthritis ([place])     Family History  Problem Relation Age of Onset  . Diabetes Mother   . Hypertension Mother   . Diabetes Sister   . Diabetes Maternal Grandmother   . Cancer Maternal Grandfather    Past Surgical History:  Procedure Laterality Date  . eye lift    . ingrown toenail Bilateral    bilateral great toes  . TUBAL LIGATION     Social History   Social History Narrative  . Not on file   Immunization History  Administered Date(s) Administered  . Moderna SARS-COVID-2 Vaccination 05/17/2019, 06/15/2019     Objective: Vital Signs: BP (!) 144/93 (BP Location: Left Arm, Patient Position: Sitting,  Cuff Size: Normal)   Pulse 80   Resp 14   Ht 5' 6.5" (1.689 m)   Wt 167 lb 12.8 oz (76.1 kg)   BMI 26.68 kg/m    Physical Exam Vitals and nursing note reviewed.  Constitutional:      Appearance: She is well-developed.  HENT:     Head: Normocephalic and atraumatic.  Eyes:     Conjunctiva/sclera: Conjunctivae normal.  Pulmonary:     Effort: Pulmonary effort is normal.    Abdominal:     Palpations: Abdomen is soft.  Musculoskeletal:     Cervical back: Normal range of motion.  Lymphadenopathy:     Cervical: No cervical adenopathy.  Skin:    General: Skin is warm and dry.     Capillary Refill: Capillary refill takes less than 2 seconds.  Neurological:     Mental Status: She is alert and oriented to person, place, and time.  Psychiatric:        Behavior: Behavior normal.      Musculoskeletal Exam:  C-spine, thoracic spine, and lumbar spine good ROM. Shoulder joints, elbow joints, wrist joints, MCPs, PIPs, and DIPs good ROM with no synovitis.  Complete fist formation bilaterally.  Hip joints, knee joints, ankle joints, MTPs, PIPs, and DIPs good ROM with no synovitis.  No warmth or effusion of knee joints.  No tenderness or swelling of ankle joints. No tenderness of MTP joints.   CDAI Exam: CDAI Score: 0.2  Patient Global: 1 mm; Provider Global: 1 mm Swollen: 0 ; Tender: 0  Joint Exam 11/22/2019   No joint exam has been documented for this visit   There is currently no information documented on the homunculus. Go to the Rheumatology activity and complete the homunculus joint exam.  Investigation: No additional findings.  Imaging: No results found.  Recent Labs: Lab Results  Component Value Date   WBC 5.2 08/06/2019   HGB 12.4 08/06/2019   PLT 259 08/06/2019   NA 142 08/06/2019   K 4.0 08/06/2019   CL 108 08/06/2019   CO2 26 08/06/2019   GLUCOSE 103 (H) 08/06/2019   BUN 13 08/06/2019   CREATININE 0.72 08/06/2019   BILITOT 0.5 08/06/2019   ALKPHOS 50 10/07/2016   AST 17 08/06/2019   ALT 9 08/06/2019   PROT 6.5 08/06/2019   ALBUMIN 4.2 10/07/2016   CALCIUM 9.5 08/06/2019   GFRAA 108 08/06/2019   QFTBGOLDPLUS POSITIVE (A) 02/12/2018    Speciality Comments: No specialty comments available.  Procedures:  No procedures performed Allergies: Arava [leflunomide] and Plaquenil [hydroxychloroquine sulfate]   Assessment / Plan:      Visit Diagnoses: Rheumatoid arthritis with rheumatoid factor of multiple sites without organ or systems involvement ([place]): She has no tenderness or synovitis on exam.  She has not had any recent rheumatoid arthritis flares.  She is not experiencing any joint pain or joint inflammation at this time.  She has not been experiencing any morning stiffness.  She is currently on methotrexate 8 tablets by mouth once weekly and folic acid 2 mg by mouth daily.  She had a flare of scleritis in her right eye about 2 weeks ago and was evaluated by Dr. Sabra Heck.  She was given a prednisolone acetate taper which she completed and her symptoms have completely resolved.  We discussed switching her from oral to injectable methotrexate to increase the efficacy and she was in agreement.  She will discontinue oral methotrexate and will be starting on methotrexate 0.8 ml sq injections  once weekly.  We will apply for Rasuvo 20 mg sq injections once weekly.  She will continue taking folic acid 2 mg by mouth daily.  All questions were addressed.  She was advised to notify us if she continues to have recurrent flares.  She will follow up in 3 months to assess her response.   High risk medication use - She we will be switching from oral to injectable methotrexate.  She will be on methotrexate 0.8 mL subcutaneous injections once weekly and folic acid 2 mg by mouth daily.  She will be discontinuing oral methotrexate.  D/c SSZ due to inadequate response-recurrent flares of scleritis.  She has had all baseline immunosuppressive labs in case we need to start her on more aggressive treatment.  She has a history of positive PPD and had a chest x-ray performed on 02/12/18. - Plan: CBC with Differential/Platelet, COMPLETE METABOLIC PANEL WITH GFR She has received both COVID-19 vaccinations and plans on receiving the third dose.  She was advised to hold methotrexate 1 week after receiving the third dose.  She was advised to avoid taking Tylenol or  NSAIDs 24 hours prior to the 3rd dose.  She is advised to notify us or her PCP if she develops a COVID-19 infection in order to receive the antibody infusion.  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  Discoid lupus: No recurrence.  We discussed the importance of avoiding direct sun exposure and wearing sunscreen SPF greater than 50 on a daily basis.  Bilateral scleritis: She had a flare of scleritis in her right eye about 2 weeks ago. She was evaluated by Dr. Sabra Heck on 11/08/19 and the sclera was found to have sectorial hyperemia and injection concentrated in the inferior sclera.  She was sent in prednisolone acetate taper, and her symptoms have resolved.  We discussed switching from oral to injectable methotrexate to increase the efficacy.  She will be switched from 8 tablets once weekly to 0.8 mL subcutaneous injections once weekly.  She will continue taking folic acid 2 mg by mouth daily.  Primary osteoarthritis of both hands: She has PIP and DIP thickening consistent with osteoarthritis of both hands.  No tenderness or inflammation was noted.  She has complete fist formation bilaterally.  She is not experiencing any discomfort, stiffness, or joint swelling at this time.  Joint protection and muscle strengthening were discussed.  Primary osteoarthritis of both knees: She has good range of motion of both knee joints on exam.  No warmth or effusion was noted.  She has occasional discomfort in her knees at last 1 to 2 days which resolves on its own.   Primary osteoarthritis of both feet: She is not experiencing any discomfort in her feet at this time.  She has no tenderness of MTP joints.  Slight thickening of bilateral first MTP joints.  She is wearing proper fitting shoes.  Positive PPD, treated: Chest x-ray on 02/12/2018 no active cardiopulmonary disease.  Smoker  Orders: Orders Placed This Encounter  Procedures  . CBC with Differential/Platelet  . COMPLETE METABOLIC PANEL WITH GFR   No orders  of the defined types were placed in this encounter.     Follow-Up Instructions: Return in about 3 months (around 02/21/2020) for Rheumatoid arthritis, Osteoarthritis, discoid lupus.   Ofilia Neas, PA-C  Note - This record has been created using Dragon software.  Chart creation errors have been sought, but may not always  have been located. Such creation errors do not reflect on  the standard  of medical care.

## 2019-11-22 ENCOUNTER — Ambulatory Visit (INDEPENDENT_AMBULATORY_CARE_PROVIDER_SITE_OTHER): Payer: BC Managed Care – PPO | Admitting: Physician Assistant

## 2019-11-22 ENCOUNTER — Other Ambulatory Visit: Payer: Self-pay

## 2019-11-22 ENCOUNTER — Telehealth: Payer: Self-pay

## 2019-11-22 ENCOUNTER — Encounter: Payer: Self-pay | Admitting: Physician Assistant

## 2019-11-22 VITALS — BP 144/93 | HR 80 | Resp 14 | Ht 66.5 in | Wt 167.8 lb

## 2019-11-22 DIAGNOSIS — M19042 Primary osteoarthritis, left hand: Secondary | ICD-10-CM

## 2019-11-22 DIAGNOSIS — H15003 Unspecified scleritis, bilateral: Secondary | ICD-10-CM

## 2019-11-22 DIAGNOSIS — M19071 Primary osteoarthritis, right ankle and foot: Secondary | ICD-10-CM

## 2019-11-22 DIAGNOSIS — M0579 Rheumatoid arthritis with rheumatoid factor of multiple sites without organ or systems involvement: Secondary | ICD-10-CM | POA: Diagnosis not present

## 2019-11-22 DIAGNOSIS — L930 Discoid lupus erythematosus: Secondary | ICD-10-CM | POA: Diagnosis not present

## 2019-11-22 DIAGNOSIS — M0630 Rheumatoid nodule, unspecified site: Secondary | ICD-10-CM | POA: Diagnosis not present

## 2019-11-22 DIAGNOSIS — M19041 Primary osteoarthritis, right hand: Secondary | ICD-10-CM

## 2019-11-22 DIAGNOSIS — R7611 Nonspecific reaction to tuberculin skin test without active tuberculosis: Secondary | ICD-10-CM

## 2019-11-22 DIAGNOSIS — M170 Bilateral primary osteoarthritis of knee: Secondary | ICD-10-CM

## 2019-11-22 DIAGNOSIS — M19072 Primary osteoarthritis, left ankle and foot: Secondary | ICD-10-CM

## 2019-11-22 DIAGNOSIS — Z79899 Other long term (current) drug therapy: Secondary | ICD-10-CM

## 2019-11-22 DIAGNOSIS — F17200 Nicotine dependence, unspecified, uncomplicated: Secondary | ICD-10-CM

## 2019-11-22 NOTE — Telephone Encounter (Signed)
Please apply for Rasuvo, per Taylor Dale, PA-C. Thanks!    

## 2019-11-22 NOTE — Patient Instructions (Signed)
COVID-19 vaccine recommendations:  ° °COVID-19 vaccine is recommended for everyone (unless you are allergic to a vaccine component), even if you are on a medication that suppresses your immune system.  ° °If you are on Methotrexate, Cellcept (mycophenolate), Rinvoq, Xeljanz, and Olumiant- hold the medication for 1 week after each vaccine. Hold Methotrexate for 2 weeks after the single dose COVID-19 vaccine.  ° °If you are on Orencia subcutaneous injection - hold medication one week prior to and one week after the first COVID-19 vaccine dose (only).  ° °If you are on Orencia IV infusions- time vaccination administration so that the first COVID-19 vaccination will occur four weeks after the infusion and postpone the subsequent infusion by one week.  ° °If you are on Cyclophosphamide or Rituxan infusions please contact your doctor prior to receiving the COVID-19 vaccine.  ° °Do not take Tylenol or any anti-inflammatory medications (NSAIDs) 24 hours prior to the COVID-19 vaccination.  ° °There is no direct evidence about the efficacy of the COVID-19 vaccine in individuals who are on medications that suppress the immune system.  ° °Even if you are fully vaccinated, and you are on any medications that suppress your immune system, please continue to wear a mask, maintain at least six feet social distance and practice hand hygiene.  ° °If you develop a COVID-19 infection, please contact your PCP or our office to determine if you need antibody infusion. ° °The booster vaccine is now available for immunocompromised patients. It is advised that if you had Pfizer vaccine you should get Pfizer booster.  If you had a Moderna vaccine then you should get a Moderna booster. Johnson and Johnson does not have a booster vaccine at this time. ° °Please see the following web sites for updated information.   ° °https://www.[email or web address].org/Portals/0/Files/COVID-19-Vaccination-Patient-Resources.pdf ° °[email or web address]://[email or web address].org/About-Us/Newsroom/Press-Releases/ID/1159 ° °Standing Labs °We placed an order today for your standing lab work.  ° °Please have your standing labs drawn in December and every 3 months  ° °If possible, please have your labs drawn 2 weeks prior to your appointment so that the provider can discuss your results at your appointment. ° °We have open lab daily °Monday through Thursday from 8:30-12:30 PM and 1:30-4:30 PM and Friday from 8:30-12:30 PM and 1:30-4:00 PM °at the office of Dr. Shaili Deveshwar, [facility].   °Please be advised, patients with office appointments requiring lab work will take precedents over walk-in lab work.  °If possible, please come for your lab work on Monday and Friday afternoons, as you may experience shorter wait times. °The office is located at [address], [address], [place], [place] [address] °No appointment is necessary.   °Labs are drawn by [facility]. Please bring your co-pay at the time of your lab draw.  You may receive a bill from [facility] for your lab work. ° °If you wish to have your labs drawn at another location, please call the office 24 hours in advance to send orders. ° °If you have any questions regarding directions or hours of operation,  °please call [phone].   °As a reminder, please drink plenty of water prior to coming for your lab work. Thanks! ° ° °

## 2019-11-23 ENCOUNTER — Ambulatory Visit: Payer: BC Managed Care – PPO | Admitting: Physician Assistant

## 2019-11-23 LAB — CBC WITH DIFFERENTIAL/PLATELET
Absolute Monocytes: 446 cells/uL (ref 200–950)
Basophils Absolute: 49 cells/uL (ref 0–200)
Basophils Relative: 1 %
Eosinophils Absolute: 426 cells/uL (ref 15–500)
Eosinophils Relative: 8.7 %
HCT: 37.8 % (ref 35.0–45.0)
Hemoglobin: 12.6 g/dL (ref 11.7–15.5)
Lymphs Abs: 1597 cells/uL (ref 850–3900)
MCH: 31.9 pg (ref 27.0–33.0)
MCHC: 33.3 g/dL (ref 32.0–36.0)
MCV: 95.7 fL (ref 80.0–100.0)
MPV: 11.2 fL (ref 7.5–12.5)
Monocytes Relative: 9.1 %
Neutro Abs: 2381 cells/uL (ref 1500–7800)
Neutrophils Relative %: 48.6 %
Platelets: 264 10*3/uL (ref 140–400)
RBC: 3.95 10*6/uL (ref 3.80–5.10)
RDW: 13.1 % (ref 11.0–15.0)
Total Lymphocyte: 32.6 %
WBC: 4.9 10*3/uL (ref 3.8–10.8)

## 2019-11-23 LAB — COMPLETE METABOLIC PANEL WITH GFR
AG Ratio: 1.5 (calc) (ref 1.0–2.5)
ALT: 13 U/L (ref 6–29)
AST: 19 U/L (ref 10–35)
Albumin: 4.1 g/dL (ref 3.6–5.1)
Alkaline phosphatase (APISO): 55 U/L (ref 37–153)
BUN: 15 mg/dL (ref 7–25)
CO2: 28 mmol/L (ref 20–32)
Calcium: 10 mg/dL (ref 8.6–10.4)
Chloride: 108 mmol/L (ref 98–110)
Creat: 0.72 mg/dL (ref 0.50–1.05)
GFR, Est African American: 107 mL/min/{1.73_m2} (ref >=60)
GFR, Est Non African American: 92 mL/min/{1.73_m2} (ref >=60)
Globulin: 2.7 g/dL (calc) (ref 1.9–3.7)
Glucose, Bld: 58 mg/dL — ABNORMAL LOW (ref 65–99)
Potassium: 4.3 mmol/L (ref 3.5–5.3)
Sodium: 142 mmol/L (ref 135–146)
Total Bilirubin: 0.4 mg/dL (ref 0.2–1.2)
Total Protein: 6.8 g/dL (ref 6.1–8.1)

## 2019-11-23 NOTE — Telephone Encounter (Signed)
Received notification from [employer] regarding a prior authorization for RASUVO. Authorization has been APPROVED from 10/24/19 to 11/22/20.   Authorization # [id number]  Per plan, patient must fill 90 day supply through mail order. Copay for 1 month is $478.32.Patient has $1003.77 remaining of her pharmacy deductible.  Patient has Pharmacist, community and is eligible to use a copay card.

## 2019-11-23 NOTE — Progress Notes (Signed)
Glucose is 58. Rest of CMP WNL. CBC WNL.

## 2019-11-30 ENCOUNTER — Other Ambulatory Visit: Payer: Self-pay | Admitting: Rheumatology

## 2019-12-15 ENCOUNTER — Other Ambulatory Visit: Payer: Self-pay | Admitting: Rheumatology

## 2019-12-15 MED ORDER — RASUVO 20 MG/0.4ML ~~LOC~~ SOAJ: 20.0000 mg | SUBCUTANEOUS | 0 refills | Status: DC

## 2019-12-15 NOTE — Telephone Encounter (Signed)
Left message to advise patient her prescription has to be filled with [facility] not [facility]. Patient advised we will send prescription.    Last Visit: 11/22/2019 Next Visit: 02/23/2020 Labs: 11/22/2019 Glucose is 58. Rest of CMP WNL. CBC WNL.   Current Dose per office note 11/22/2019: methotrexate 0.8 ml sq injections once weekly DX: Rheumatoid arthritis with rheumatoid factor of multiple sites without organ or systems involvement   Patient approved for Rasuvo and it has to be filled with [facility].  Okay to refill Rasuvo?

## 2019-12-15 NOTE — Telephone Encounter (Signed)
Patient received a letter stating MTX will covered with [facility]. Please send in rx for patient.  Patient out of medication, and next dose due Friday.

## 2019-12-16 ENCOUNTER — Telehealth: Payer: Self-pay

## 2019-12-16 NOTE — Telephone Encounter (Signed)
Patient called stating she spoke with [facility] and was told the Methotrexate Auto-injector is $1,200 compared to a 90 day supply of methotrexate prefilled which is $20.00.  Patient states the auto-injector is "out of her price range" and will need to get use to the needles.  Patient is requesting the prescription be sent to either [facility] or [facility].  Patient requested a return call.

## 2019-12-17 ENCOUNTER — Telehealth: Payer: Self-pay | Admitting: Rheumatology

## 2019-12-17 NOTE — Telephone Encounter (Signed)
Attempted to contact the patient and left message for patient to call the office.  

## 2019-12-17 NOTE — Telephone Encounter (Signed)
Patient returning your call. Requests a call back on number below.

## 2019-12-17 NOTE — Telephone Encounter (Signed)
Spoke with patient. She states she has not signed up for the co pay card. Patient will sign up for that and let us know if the Rasuvo is still to costly.

## 2019-12-20 ENCOUNTER — Telehealth: Payer: Self-pay | Admitting: Rheumatology

## 2019-12-20 NOTE — Telephone Encounter (Signed)
Resuvo is going to cost too much for the patient. Patient would like to due MTX vilals with the syringes. It is at least the third of the cost, and patient does not have a problem injecting herself. Please call to discuss.

## 2019-12-21 MED ORDER — "TUBERCULIN SYRINGE 27G X 1/2"" 1 ML MISC": 2 refills | Status: DC

## 2019-12-21 MED ORDER — METHOTREXATE SODIUM CHEMO INJECTION 50 MG/2ML: INTRAMUSCULAR | 0 refills | Status: DC

## 2019-12-21 NOTE — Telephone Encounter (Signed)
It is okay to change to vial and syringe instead of injectable methotrexate.  Her doses methotrexate 0.8 mL subcu weekly.

## 2019-12-21 NOTE — Telephone Encounter (Signed)
Spoke with patient and advised her that It is okay to change to vial and syringe. Patient verbalized understanding. Please send new prescriptions for methotrexate and syringes to [facility].

## 2019-12-21 NOTE — Telephone Encounter (Signed)
Patient states she injected vial and syringe methotrexate years ago so she is familiar with injection technique. Patient would like to proceed with vial and syringe versus auto injector due to the cost. Please advise.

## 2019-12-24 DIAGNOSIS — Z13220 Encounter for screening for lipoid disorders: Secondary | ICD-10-CM | POA: Diagnosis not present

## 2019-12-24 DIAGNOSIS — Z800 Family history of malignant neoplasm of digestive organs: Secondary | ICD-10-CM | POA: Diagnosis not present

## 2019-12-24 DIAGNOSIS — Z0000 Encounter for general adult medical examination without abnormal findings: Secondary | ICD-10-CM | POA: Diagnosis not present

## 2020-01-04 ENCOUNTER — Other Ambulatory Visit: Payer: Self-pay | Admitting: *Deleted

## 2020-01-04 MED ORDER — FOLIC ACID 1 MG PO TABS
2.0000 mg | ORAL_TABLET | Freq: Every day | ORAL | 3 refills | Status: DC
Start: 2020-01-04 — End: 2021-02-07

## 2020-01-04 NOTE — Telephone Encounter (Signed)
Last Visit: 11/22/2019 Next Visit: 02/23/2020  Okay to refill per Dr. Estanislado Pandy

## 2020-01-14 ENCOUNTER — Other Ambulatory Visit: Payer: Self-pay | Admitting: Rheumatology

## 2020-01-14 NOTE — Telephone Encounter (Signed)
Last Visit: 11/22/2019 Next Visit: 02/23/2020 Labs: 11/22/2019 Glucose is 58. Rest of CMP WNL. CBC WNL.   Current Dose per office note 11/22/2019: methotrexate 0.8 ml sqinjections once weekly DX: Rheumatoid arthritis with rheumatoid factor of multiple sites without organ or systems involvement    Okay to refill MTX?

## 2020-02-10 NOTE — Progress Notes (Signed)
Office Visit Note  Patient: Samantha Hughes             Date of Birth: 1960/09/28           MRN: [id number]             PCP: Kelton Pillar, MD Referring: Kelton Pillar, MD Visit Date: 02/23/2020 Occupation: @GUAROCC @  Subjective:  Medication monitoring   History of Present Illness: Samantha Hughes is a 59 y.o. female with history of seropositive rheumatoid arthritis.  She is on methotrexate 0.8 ml sq injections once weekly and folic acid 2 mg po daily.  She has noticed significant clinical improvement since switching from oral to injectable methotrexate.  Her oral ulcerations have resolved since making the switch.  She denies any recent scleritis flares.  Her last flare was in August prior to making the switch.  She has not needed to see Dr. Sabra Heck recently and has not needed to use prednisone eyedrops recently.  Patient ports that 1 month ago she experienced a flare in her right thumb which was self resolving.  She is occasional discomfort in her left knee joint and uses Voltaren gel as well as a compression wrap which resolved her discomfort. She reports that over the past few months she has noticed intermittent hoarseness.  She denies a sore throat, postnasal drip, fever, difficulty swallowing, or neck pain at this time.  She states that while at work she is today wearing a fullface respirator for about 70% of her day which may be contributing to her symptoms.  She states that she does experience mouth dryness and tries to keep her fluid intake up.  She has not tried any over-the-counter products for mouth dryness. She denies any recent infections.  She is planning on receiving her covid booster on 03/02/20 and will hold MTX for 1 week after.   Activities of Daily Living:  Patient reports morning stiffness for a few minutes.   Patient Denies nocturnal pain.  Difficulty dressing/grooming: Denies Difficulty climbing stairs: Denies Difficulty getting out of chair: Denies Difficulty using  hands for taps, buttons, cutlery, and/or writing: Denies  Review of Systems  Constitutional: Negative for fatigue.  HENT: Negative for mouth sores, mouth dryness and nose dryness.   Eyes: Negative for pain, visual disturbance and dryness.  Respiratory: Negative for cough, hemoptysis, shortness of breath and difficulty breathing.   Cardiovascular: Negative for chest pain, palpitations, hypertension and swelling in legs/feet.  Gastrointestinal: Negative for blood in stool, constipation and diarrhea.  Endocrine: Negative for increased urination.  Genitourinary: Negative for painful urination.  Musculoskeletal: Positive for arthralgias, joint pain and morning stiffness. Negative for joint swelling, myalgias, muscle weakness, muscle tenderness and myalgias.  Skin: Negative for color change, pallor, rash, hair loss, nodules/bumps, skin tightness, ulcers and sensitivity to sunlight.  Allergic/Immunologic: Negative for susceptible to infections.  Neurological: Positive for headaches. Negative for dizziness, numbness and weakness.  Hematological: Negative for swollen glands.  Psychiatric/Behavioral: Negative for depressed mood and sleep disturbance. The patient is not nervous/anxious.     PMFS History:  Patient Active Problem List   Diagnosis Date Noted  . Smoker 07/08/2016  . 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 Rheumatoid arthritis with rheumatoid factor of multiple sites without organ or systems involvement ([place]) 02/06/2016  . Discoid lupus 02/06/2016  . Primary osteoarthritis of both hands 02/06/2016  . Primary osteoarthritis of both knees 02/06/2016  . 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 High risk medication  use 02/06/2016  . Positive PPD, treated 02/06/2016    Past Medical History:  Diagnosis Date  . Depression   . Osteoarthritis   . Rheumatoid arthritis ([place])     Family History  Problem Relation Age of Onset  . Diabetes Mother   .  Hypertension Mother   . Diabetes Sister   . Diabetes Maternal Grandmother   . Cancer Maternal Grandfather    Past Surgical History:  Procedure Laterality Date  . eye lift    . ingrown toenail Bilateral    bilateral great toes  . TUBAL LIGATION     Social History   Social History Narrative  . Not on file   Immunization History  Administered Date(s) Administered  . Moderna Sars-Covid-2 Vaccination 05/17/2019, 06/15/2019     Objective: Vital Signs: BP 137/87 (BP Location: Left Arm, Patient Position: Sitting, Cuff Size: Normal)   Pulse 79   Resp 16   Ht 5' 6.25" (1.683 m)   Wt 166 lb 9.6 oz (75.6 kg)   BMI 26.69 kg/m    Physical Exam Vitals and nursing note reviewed.  Constitutional:      Appearance: She is well-developed and well-nourished.  HENT:     Head: Normocephalic and atraumatic.  Eyes:     Extraocular Movements: EOM normal.     Conjunctiva/sclera: Conjunctivae normal.  Cardiovascular:     Pulses: Intact distal pulses.  Pulmonary:     Effort: Pulmonary effort is normal.  Abdominal:     Palpations: Abdomen is soft.  Musculoskeletal:     Cervical back: Normal range of motion.  Skin:    General: Skin is warm and dry.     Capillary Refill: Capillary refill takes less than 2 seconds.  Neurological:     Mental Status: She is alert and oriented to person, place, and time.  Psychiatric:        Mood and Affect: Mood and affect normal.        Behavior: Behavior normal.      Musculoskeletal Exam:  C-spine, thoracic spine, and lumbar spine good ROM.  Shoulder joints, elbow joints, wrist joints, MCPs, PIPs, and DIPs good ROM with no synovitis.  Thickening of the right 2nd MCP joint. PIP and DIP thickening consistent with osteoarthritis of both hands.  Tenderness over the left 4th PIP joint.  Complete fist formation bilaterally.  Knee joints good ROM with no warmth or effusion.  Ankle joints good ROM with no tenderness or inflammation.  Tenderness over the left 4th  metatarsal.  No tenderness over MTP joints.   CDAI Exam: CDAI Score: 1.7  Patient Global: 5 mm; Provider Global: 2 mm Swollen: 0 ; Tender: 1  Joint Exam 02/23/2020      Right  Left  PIP 4      Tender     Investigation: No additional findings.  Imaging: No results found.  Recent Labs: Lab Results  Component Value Date   WBC 4.9 11/22/2019   HGB 12.6 11/22/2019   PLT 264 11/22/2019   NA 142 11/22/2019   K 4.3 11/22/2019   CL 108 11/22/2019   CO2 28 11/22/2019   GLUCOSE 58 (L) 11/22/2019   BUN 15 11/22/2019   CREATININE 0.72 11/22/2019   BILITOT 0.4 11/22/2019   ALKPHOS 50 10/07/2016   AST 19 11/22/2019   ALT 13 11/22/2019   PROT 6.8 11/22/2019   ALBUMIN 4.2 10/07/2016   CALCIUM 10.0 11/22/2019   GFRAA 107 11/22/2019   QFTBGOLDPLUS POSITIVE (A) 02/12/2018  Speciality Comments: No specialty comments available.  Procedures:  No procedures performed Allergies: Arava [leflunomide] and Plaquenil [hydroxychloroquine sulfate]   Assessment / Plan:     Visit Diagnoses: Rheumatoid arthritis with rheumatoid factor of multiple sites without organ or systems involvement ([place]) -She has no synovitis on examination today.   She has not had any recent rheumatoid arthritis flares.  She is clinically doing well on methotrexate 0.8 mL sq injections once weekly and folic acid 2 mg by mouth daily.  She has noticed clinical improvement since switching from oral to injectable methotrexate. No recurrence of mouth sores.  She is not experiencing any increased joint pain or inflammation at this time. X-rays of both hands and feet were updated today which did not reveal any radiographic progression of rheumatoid arthritis.  On x-ray she was found to have a nondisplaced intraarticular fracture of the base of the left 5th proximal phalanx.  She will be evaluated by Dr. Marlou Sa tomorrow for further management. Of note, she has not had any recent scleritis flares since switching from oral to injectable  MTX.  She will continue on the current treatment regimen.  She will follow up in the office in 5 months.  Plan: XR Hand 2 View Right, XR Hand 2 View Left, XR Foot 2 Views Right, XR Foot 2 Views Left  High risk medication use - Methotrexate 0.8 ml subcutaneous injections once weekly and folic acid 2 mg by mouth daily. D/c SSZ due to inadequate response-recurrent flares of scleritis.  CBC and CMP updated on 11/22/19. She is due to update CBC and CMP today.  Orders were released.  Her next lab work will be due to March and every 3 months.  Standing orders were placed today.  She was advised to hold methotrexate if she develops signs or symptoms of an infection and to resume once the infection has completely cleared.   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  Pain in left foot - She presents today with subacute pain in the left foot after an injury about 1 month ago.  According to the patient she hit her toe on something and it spread her 4th and 5th toes during the time of the injury. She has been been fully weight bearing for the past 1 month.  She has tenderness to palpation over the left 4th and 5th MTP and PIP joints.  X-rays of the left foot were updated today which revealed a nondisplaced intraarticular fracture at the base of the 5th proximal phalanx.  She will be seen urgently by Dr. Marlou Sa tomorrow at [facility] for further evaluation and management.  Plan: XR Foot 2 Views Left DEXA will be placed today for osteoporosis screening.  Discoid lupus - No recurrence  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 Most recent flare in August 2021.  She switched from oral to injectable MTX after last visit on 11/22/19, and she has not had any flares since then. She is not experiencing any eye pain, photophobia, or erythema at this time.    Primary osteoarthritis of both hands: She  has PIP and DIP thickening consistent with osteoarthritis of both hands.  No tenderness or inflammation noted on exam.  She is able to make a complete fist bilaterally.  Joint protection and muscle strengthening were discussed.    Primary osteoarthritis of both knees: She has good ROM of both knee joints on exam.  No  warmth or effusion noted.  She has occasional pain in the left knee joint, which is typically alleviated by applying voltaren gel topically and wearing a compression wrap while at work.   Primary osteoarthritis of both feet: She presents today with increased pain in the left foot after an injury 1 month ago.  She has tenderness to palpation over the left 4th and 5th MTP joints. No ecchymosis or swelling was apparent.  X-rays of both feet were updated today to assess for radiographic progression and to evaluate the subacute pain in her left foot she has been experiencing. Referred to Dr. Marlou Sa for further evaluation and management of fracture of 5th proximal phalanx.   Osteoporosis screening: Order for DEXA placed today.  She has history of long term steroid use and recent fracture.    Other medical conditions are listed as follows:   Positive PPD, treated - Chest x-ray on 02/12/2018 no active cardiopulmonary disease.  Smoker   Orders: Orders Placed This Encounter  Procedures  . XR Hand 2 View Right  . XR Hand 2 View Left  . XR Foot 2 Views Right  . XR Foot 2 Views Left  . CBC with Differential/Platelet  . COMPLETE METABOLIC PANEL WITH GFR  . COMPLETE METABOLIC PANEL WITH GFR  . CBC with Differential/Platelet   No orders of the defined types were placed in this encounter.   Follow-Up Instructions: Return in about 5 months (around 07/23/2020) for Rheumatoid arthritis.   Ofilia Neas, PA-C  Note - This record has been created using Dragon software.  Chart creation errors have been sought, but may not always  have been located. Such creation errors do not reflect on  the  standard of medical care.

## 2020-02-23 ENCOUNTER — Ambulatory Visit: Payer: Self-pay

## 2020-02-23 ENCOUNTER — Other Ambulatory Visit: Payer: Self-pay

## 2020-02-23 ENCOUNTER — Telehealth: Payer: Self-pay | Admitting: Physician Assistant

## 2020-02-23 ENCOUNTER — Ambulatory Visit (INDEPENDENT_AMBULATORY_CARE_PROVIDER_SITE_OTHER): Payer: BC Managed Care – PPO | Admitting: Physician Assistant

## 2020-02-23 ENCOUNTER — Encounter: Payer: Self-pay | Admitting: Physician Assistant

## 2020-02-23 VITALS — BP 137/87 | HR 79 | Resp 16 | Ht 66.25 in | Wt 166.6 lb

## 2020-02-23 DIAGNOSIS — L930 Discoid lupus erythematosus: Secondary | ICD-10-CM

## 2020-02-23 DIAGNOSIS — M19072 Primary osteoarthritis, left ankle and foot: Secondary | ICD-10-CM

## 2020-02-23 DIAGNOSIS — M0579 Rheumatoid arthritis with rheumatoid factor of multiple sites without organ or systems involvement: Secondary | ICD-10-CM

## 2020-02-23 DIAGNOSIS — M0630 Rheumatoid nodule, unspecified site: Secondary | ICD-10-CM

## 2020-02-23 DIAGNOSIS — M19042 Primary osteoarthritis, left hand: Secondary | ICD-10-CM | POA: Diagnosis not present

## 2020-02-23 DIAGNOSIS — M79672 Pain in left foot: Secondary | ICD-10-CM | POA: Diagnosis not present

## 2020-02-23 DIAGNOSIS — M19041 Primary osteoarthritis, right hand: Secondary | ICD-10-CM

## 2020-02-23 DIAGNOSIS — S92902A Unspecified fracture of left foot, initial encounter for closed fracture: Secondary | ICD-10-CM

## 2020-02-23 DIAGNOSIS — Z79899 Other long term (current) drug therapy: Secondary | ICD-10-CM | POA: Diagnosis not present

## 2020-02-23 DIAGNOSIS — R7611 Nonspecific reaction to tuberculin skin test without active tuberculosis: Secondary | ICD-10-CM

## 2020-02-23 DIAGNOSIS — H15003 Unspecified scleritis, bilateral: Secondary | ICD-10-CM

## 2020-02-23 DIAGNOSIS — Z13820 Encounter for screening for osteoporosis: Secondary | ICD-10-CM

## 2020-02-23 DIAGNOSIS — M19071 Primary osteoarthritis, right ankle and foot: Secondary | ICD-10-CM | POA: Diagnosis not present

## 2020-02-23 DIAGNOSIS — F17200 Nicotine dependence, unspecified, uncomplicated: Secondary | ICD-10-CM

## 2020-02-23 DIAGNOSIS — R490 Dysphonia: Secondary | ICD-10-CM

## 2020-02-23 DIAGNOSIS — M170 Bilateral primary osteoarthritis of knee: Secondary | ICD-10-CM

## 2020-02-23 NOTE — Patient Instructions (Addendum)
Standing Labs We placed an order today for your standing lab work.   Please have your standing labs drawn in March and every 3 months   If possible, please have your labs drawn 2 weeks prior to your appointment so that the provider can discuss your results at your appointment.  We have open lab daily Monday through Thursday from 8:30-12:30 PM and 1:30-4:30 PM and Friday from 8:30-12:30 PM and 1:30-4:00 PM at the office of Dr. Bo Merino, [facility].   Please be advised, patients with office appointments requiring lab work will take precedents over walk-in lab work.  If possible, please come for your lab work on Monday and Friday afternoons, as you may experience shorter wait times. The office is located at [address] [place], [place], [place] [address] No appointment is necessary.   Labs are drawn by [facility]. Please bring your co-pay at the time of your lab draw.  You may receive a bill from [facility] for your lab work.  If you wish to have your labs drawn at another location, please call the office 24 hours in advance to send orders.  If you have any questions regarding directions or hours of operation,  please call [phone].   As a reminder, please drink plenty of water prior to coming for your lab work. Thanks!  COVID-19 vaccine recommendations:   COVID-19 vaccine is recommended for everyone (unless you are allergic to a vaccine component), even if you are on a medication that suppresses your immune system.   If you are on Methotrexate, Cellcept (mycophenolate), Rinvoq, Morrie Sheldon, and Olumiant- hold the medication for 1 week after each vaccine. Hold Methotrexate for 2 weeks after the single dose COVID-19 vaccine.   If you are on Orencia subcutaneous injection - hold medication one week prior to and one week after the first COVID-19 vaccine dose (only).   If you are on Orencia IV infusions- time vaccination administration so that the first COVID-19 vaccination  will occur four weeks after the infusion and postpone the subsequent infusion by one week.   If you are on Cyclophosphamide or Rituxan infusions please contact your doctor prior to receiving the COVID-19 vaccine.   Do not take Tylenol or any anti-inflammatory medications (NSAIDs) 24 hours prior to the COVID-19 vaccination.   There is no direct evidence about the efficacy of the COVID-19 vaccine in individuals who are on medications that suppress the immune system.   Even if you are fully vaccinated, and you are on any medications that suppress your immune system, please continue to wear a mask, maintain at least six feet social distance and practice hand hygiene.   If you develop a COVID-19 infection, please contact your PCP or our office to determine if you need monoclonal antibody infusion.  The booster vaccine is now available for immunocompromised patients.   Please see the following web sites for updated information.   [email or web address]

## 2020-02-23 NOTE — Telephone Encounter (Signed)
Attempted to call the patients cell x2 and home phone x1 today to discuss x-rays results of both hands and both feet. Findings were consistent with osteoarthritis and rheumatoid arthritis overlap.  No radiographic progression since 2018.    Also wanted to notify the patient that I have placed a future order for DEXA scan for osteoporosis screening due to recent fracture.    Hazel Sams, PA-C

## 2020-02-24 ENCOUNTER — Ambulatory Visit (INDEPENDENT_AMBULATORY_CARE_PROVIDER_SITE_OTHER): Payer: BC Managed Care – PPO | Admitting: Orthopedic Surgery

## 2020-02-24 ENCOUNTER — Encounter: Payer: Self-pay | Admitting: Orthopedic Surgery

## 2020-02-24 ENCOUNTER — Other Ambulatory Visit: Payer: Self-pay

## 2020-02-24 DIAGNOSIS — S92912A Unspecified fracture of left toe(s), initial encounter for closed fracture: Secondary | ICD-10-CM

## 2020-02-24 LAB — CBC WITH DIFFERENTIAL/PLATELET
Absolute Monocytes: 370 cells/uL (ref 200–950)
Basophils Absolute: 40 cells/uL (ref 0–200)
Basophils Relative: 0.9 %
Eosinophils Absolute: 348 cells/uL (ref 15–500)
Eosinophils Relative: 7.9 %
HCT: 37.5 % (ref 35.0–45.0)
Hemoglobin: 12.8 g/dL (ref 11.7–15.5)
Lymphs Abs: 1637 cells/uL (ref 850–3900)
MCH: 31.9 pg (ref 27.0–33.0)
MCHC: 34.1 g/dL (ref 32.0–36.0)
MCV: 93.5 fL (ref 80.0–100.0)
MPV: 11.2 fL (ref 7.5–12.5)
Monocytes Relative: 8.4 %
Neutro Abs: 2006 cells/uL (ref 1500–7800)
Neutrophils Relative %: 45.6 %
Platelets: 254 10*3/uL (ref 140–400)
RBC: 4.01 10*6/uL (ref 3.80–5.10)
RDW: 13.2 % (ref 11.0–15.0)
Total Lymphocyte: 37.2 %
WBC: 4.4 10*3/uL (ref 3.8–10.8)

## 2020-02-24 LAB — COMPLETE METABOLIC PANEL WITH GFR
AG Ratio: 1.6 (calc) (ref 1.0–2.5)
ALT: 11 U/L (ref 6–29)
AST: 18 U/L (ref 10–35)
Albumin: 4.1 g/dL (ref 3.6–5.1)
Alkaline phosphatase (APISO): 56 U/L (ref 37–153)
BUN: 18 mg/dL (ref 7–25)
CO2: 30 mmol/L (ref 20–32)
Calcium: 9.6 mg/dL (ref 8.6–10.4)
Chloride: 107 mmol/L (ref 98–110)
Creat: 0.72 mg/dL (ref 0.50–1.05)
GFR, Est African American: 107 mL/min/{1.73_m2} (ref >=60)
GFR, Est Non African American: 92 mL/min/{1.73_m2} (ref >=60)
Globulin: 2.6 g/dL (calc) (ref 1.9–3.7)
Glucose, Bld: 67 mg/dL (ref 65–99)
Potassium: 4.5 mmol/L (ref 3.5–5.3)
Sodium: 142 mmol/L (ref 135–146)
Total Bilirubin: 0.4 mg/dL (ref 0.2–1.2)
Total Protein: 6.7 g/dL (ref 6.1–8.1)

## 2020-02-24 NOTE — Progress Notes (Signed)
CBC and CMP WNL

## 2020-02-24 NOTE — Progress Notes (Signed)
Office Visit Note   Patient: Samantha Hughes           Date of Birth: 08-13-1960           MRN: [id number] Visit Date: 02/24/2020 Requested by: Kelton Pillar, MD [address] [place],  [place] [address] PCP: Kelton Pillar, MD  Subjective: Chief Complaint  Patient presents with  . Left Foot - Pain    HPI: Samantha Hughes is a 59 year old patient with left toe fracture.  Date of injury 3 weeks ago.  Hit it on a nightstand.  Still has pain in it but it is improving.  She wears steel toed shoes at work.  Taking IV methotrexate for rheumatoid arthritis.  She smokes less than 10 cigarettes/day.  The pain is improving.             ROS: All systems reviewed are negative as they relate to the chief complaint within the history of present illness.  Patient denies  fevers or chills.   Assessment & Plan: Visit Diagnoses:  1. Unspecified fracture of left toe(s), initial encounter for closed fracture     Plan: Impression is left toe fracture nondisplaced base of the proximal phalanx.  Not too tender on exam today.  Plan is to continue to wear harder soled shoes.  Okay to work.  Repeat radiographs in 3 weeks just to make sure fracture callus is present.  Unlikely that after 3 weeks the nondisplaced fracture would shift at this time.  Nonetheless I do want to see some more new bone formation around the fracture site that is present at this time.  Follow-Up Instructions: Return in about 3 weeks (around 03/16/2020).   Orders:  No orders of the defined types were placed in this encounter.  No orders of the defined types were placed in this encounter.     Procedures: No procedures performed   Clinical Data: No additional findings.  Objective: Vital Signs: There were no vitals taken for this visit.  Physical Exam:   Constitutional: Patient appears well-developed HEENT:  Head: Normocephalic Eyes:EOM are normal Neck: Normal range of motion Cardiovascular: Normal  rate Pulmonary/chest: Effort normal Neurologic: Patient is alert Skin: Skin is warm Psychiatric: Patient has normal mood and affect    Ortho Exam: Ortho exam demonstrates full active and passive range of motion of the ankle.  Slight tenderness to palpation at the MTP joint #5 on the left toe.  Pedal pulses palpable.  Ankle dorsiflexion plantarflexion intact.  Not too much swelling present in that left forefoot region.  Specialty Comments:  No specialty comments available.  Imaging: XR Foot 2 Views Left  Result Date: 02/23/2020 First MTP, PIP and DIP narrowing was noted.  No MTP joint narrowing or erosive changes were noted.  No intertarsal, tibiotalar or subtalar joint space narrowing was noted.  No radiographic progression was noted when compared to the x-rays of 2018.  A nondisplaced fracture of the base of the fifth proximal phalanx was noted. Impression: These findings are consistent with rheumatoid arthritis and osteoarthritis overlap.  A fracture of the base of fifth proximal phalanx was noted.  XR Foot 2 Views Right  Result Date: 02/23/2020 Juxta-articular osteopenia was noted.  First MTP, PIP and DIP narrowing was noted.  No erosive changes were noted.  No intertarsal, tibiotalar or subtalar joint space narrowing was noted. Impression: These findings are consistent with rheumatoid arthritis and osteoarthritis overlap.  XR Hand 2 View Left  Result Date: 02/23/2020 Juxta-articular osteopenia was  noted.  CMC, PIP and DIP narrowing was noted.  Mild intercarpal and radiocarpal joint space narrowing was noted.  No erosive changes were noted.  No radiographic progression was noted when compared to the x-rays of 2018. Impression: These findings are consistent with rheumatoid arthritis and osteoarthritis overlap.  XR Hand 2 View Right  Result Date: 02/23/2020 Juxta-articular osteopenia was noted.  PIP and DIP narrowing was noted.  Mild first and third MCP joint narrowing was noted.   Mild intercarpal and radiocarpal joint space narrowing was noted.  No erosive changes were noted.  No radiographic progression was noted when compared to the x-rays of 2018. Impression: These findings are consistent with osteoarthritis and rheumatoid arthritis overlap.    PMFS History: Patient Active Problem List   Diagnosis Date Noted  . Smoker 07/08/2016  . 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 Rheumatoid arthritis with rheumatoid factor of multiple sites without organ or systems involvement ([place]) 02/06/2016  . Discoid lupus 02/06/2016  . Primary osteoarthritis of both hands 02/06/2016  . Primary osteoarthritis of both knees 02/06/2016  . 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 High risk medication use 02/06/2016  . Positive PPD, treated 02/06/2016   Past Medical History:  Diagnosis Date  . Depression   . Osteoarthritis   . Rheumatoid arthritis ([place])     Family History  Problem Relation Age of Onset  . Diabetes Mother   . Hypertension Mother   . Diabetes Sister   . Diabetes Maternal Grandmother   . Cancer Maternal Grandfather     Past Surgical History:  Procedure Laterality Date  . eye lift    . ingrown toenail Bilateral    bilateral great toes  . TUBAL LIGATION     Social History   Occupational History  . Not on file  Tobacco Use  . Smoking status: Current Every Day Smoker    Packs/day: 0.50    Years: 30.00    Pack years: 15.00    Types: Cigarettes  . Smokeless tobacco: Never Used  Vaping Use  . Vaping Use: Never used  Substance and Sexual Activity  . Alcohol use: Yes    Comment: Socially   . Drug use: No  . Sexual activity: Not on file

## 2020-02-25 NOTE — Telephone Encounter (Signed)
Attempted to contact the patient and left message for patient to call the office.  

## 2020-02-29 NOTE — Telephone Encounter (Signed)
Attempted to contact the patient on home and cell number. Left message for patient to call the office.

## 2020-03-16 ENCOUNTER — Ambulatory Visit (INDEPENDENT_AMBULATORY_CARE_PROVIDER_SITE_OTHER): Payer: BC Managed Care – PPO

## 2020-03-16 ENCOUNTER — Ambulatory Visit (INDEPENDENT_AMBULATORY_CARE_PROVIDER_SITE_OTHER): Payer: BC Managed Care – PPO | Admitting: Orthopedic Surgery

## 2020-03-16 DIAGNOSIS — S92912A Unspecified fracture of left toe(s), initial encounter for closed fracture: Secondary | ICD-10-CM

## 2020-03-16 DIAGNOSIS — M79672 Pain in left foot: Secondary | ICD-10-CM

## 2020-03-18 ENCOUNTER — Encounter: Payer: Self-pay | Admitting: Orthopedic Surgery

## 2020-03-18 NOTE — Progress Notes (Unsigned)
   Fracture Visit Note   Patient: Samantha Hughes           Date of Birth: 1960/12/22           MRN: [id number] Visit Date: 03/16/2020 PCP: Kelton Pillar, MD   Assessment & Plan:  Chief Complaint:  Chief Complaint  Patient presents with  . Left Foot - Fracture, Follow-up   Visit Diagnoses:  1. Unspecified fracture of left toe(s), initial encounter for closed fracture   2. Pain in left foot     Plan: Patient is a 60 year old female who presents for reevaluation of left fifth toe fracture. She notes that she feels she is much improved. She does not have to take any pain medication. She denies any pain with walking. She is walking in a regular shoe. Pain does not wake her up at night. She denies any persistent swelling or any significant reinjury. She does have some mild tenderness to palpation over the fracture site on exam today but she has no significant pain with passive range of motion of the fifth toe and she walks without any hint of antalgia. Radiographs show progressive healing at the fracture site compared with prior radiographs. Overall she is progressing well and plan for patient to follow-up with the office as needed. She agrees with plan. Return if she has any continued symptoms of pain or other concerns.  Follow-Up Instructions: No follow-ups on file.   Orders:  Orders Placed This Encounter  Procedures  . XR Foot Complete Left   No orders of the defined types were placed in this encounter.   Imaging: No results found.  PMFS History: Patient Active Problem List   Diagnosis Date Noted  . Smoker 07/08/2016  . 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 Rheumatoid arthritis with rheumatoid factor of multiple sites without organ or systems involvement ([place]) 02/06/2016  . Discoid lupus 02/06/2016  . Primary osteoarthritis of both hands 02/06/2016  . Primary osteoarthritis of both knees 02/06/2016  . 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 High risk medication use 02/06/2016  . Positive PPD, treated 02/06/2016   Past Medical History:  Diagnosis Date  . Depression   . Osteoarthritis   . Rheumatoid arthritis ([place])     Family History  Problem Relation Age of Onset  . Diabetes Mother   . Hypertension Mother   . Diabetes Sister   . Diabetes Maternal Grandmother   . Cancer Maternal Grandfather     Past Surgical History:  Procedure Laterality Date  . eye lift    . ingrown toenail Bilateral    bilateral great toes  . TUBAL LIGATION     Social History   Occupational History  . Not on file  Tobacco Use  . Smoking status: Current Every Day Smoker    Packs/day: 0.50    Years: 30.00    Pack years: 15.00    Types: Cigarettes  . Smokeless tobacco: Never Used  Vaping Use  . Vaping Use: Never used  Substance and Sexual Activity  . Alcohol use: Yes    Comment: Socially   . Drug use: No  . Sexual activity: Not on file

## 2020-04-05 ENCOUNTER — Encounter: Payer: Self-pay | Admitting: Physician Assistant

## 2020-04-05 DIAGNOSIS — Z780 Asymptomatic menopausal state: Secondary | ICD-10-CM | POA: Diagnosis not present

## 2020-04-05 DIAGNOSIS — Z13820 Encounter for screening for osteoporosis: Secondary | ICD-10-CM | POA: Diagnosis not present

## 2020-04-07 ENCOUNTER — Telehealth: Payer: Self-pay | Admitting: *Deleted

## 2020-04-07 NOTE — Telephone Encounter (Signed)
Received DEXA results from [facility]  Date of Scan: 04/05/2020 Lowest T-score and site measured: 1.4 AP Spine Significant changes in BMD and site measured (5% and above): N/A  Recommendation: Repeat DEXA in 5 years

## 2020-04-07 NOTE — Telephone Encounter (Signed)
Left message to advise patient her bone density scan is normal and she may repeat in 5 years.

## 2020-04-20 ENCOUNTER — Other Ambulatory Visit: Payer: Self-pay | Admitting: Rheumatology

## 2020-04-20 NOTE — Telephone Encounter (Signed)
Last Visit: 02/23/2020 Next Visit: 08/01/2020 Labs: 02/23/2020, CBC and CMP WNL  Current Dose per office note 02/23/2020, Methotrexate 0.8 ml subcutaneous injections once weekly DX: Rheumatoid arthritis with rheumatoid factor of multiple sites without organ or systems involvement   Last Fill: 01/14/2020  Okay to refill MTX?

## 2020-04-27 IMAGING — DX DG CHEST 2V
2 series · 2 of 2 positions shown · non-contrast
Comparison: 05/01/2015

CLINICAL DATA: High risk medication use. Immunosuppressive therapy.

EXAM:
CHEST - 2 VIEW

[w chest pa]
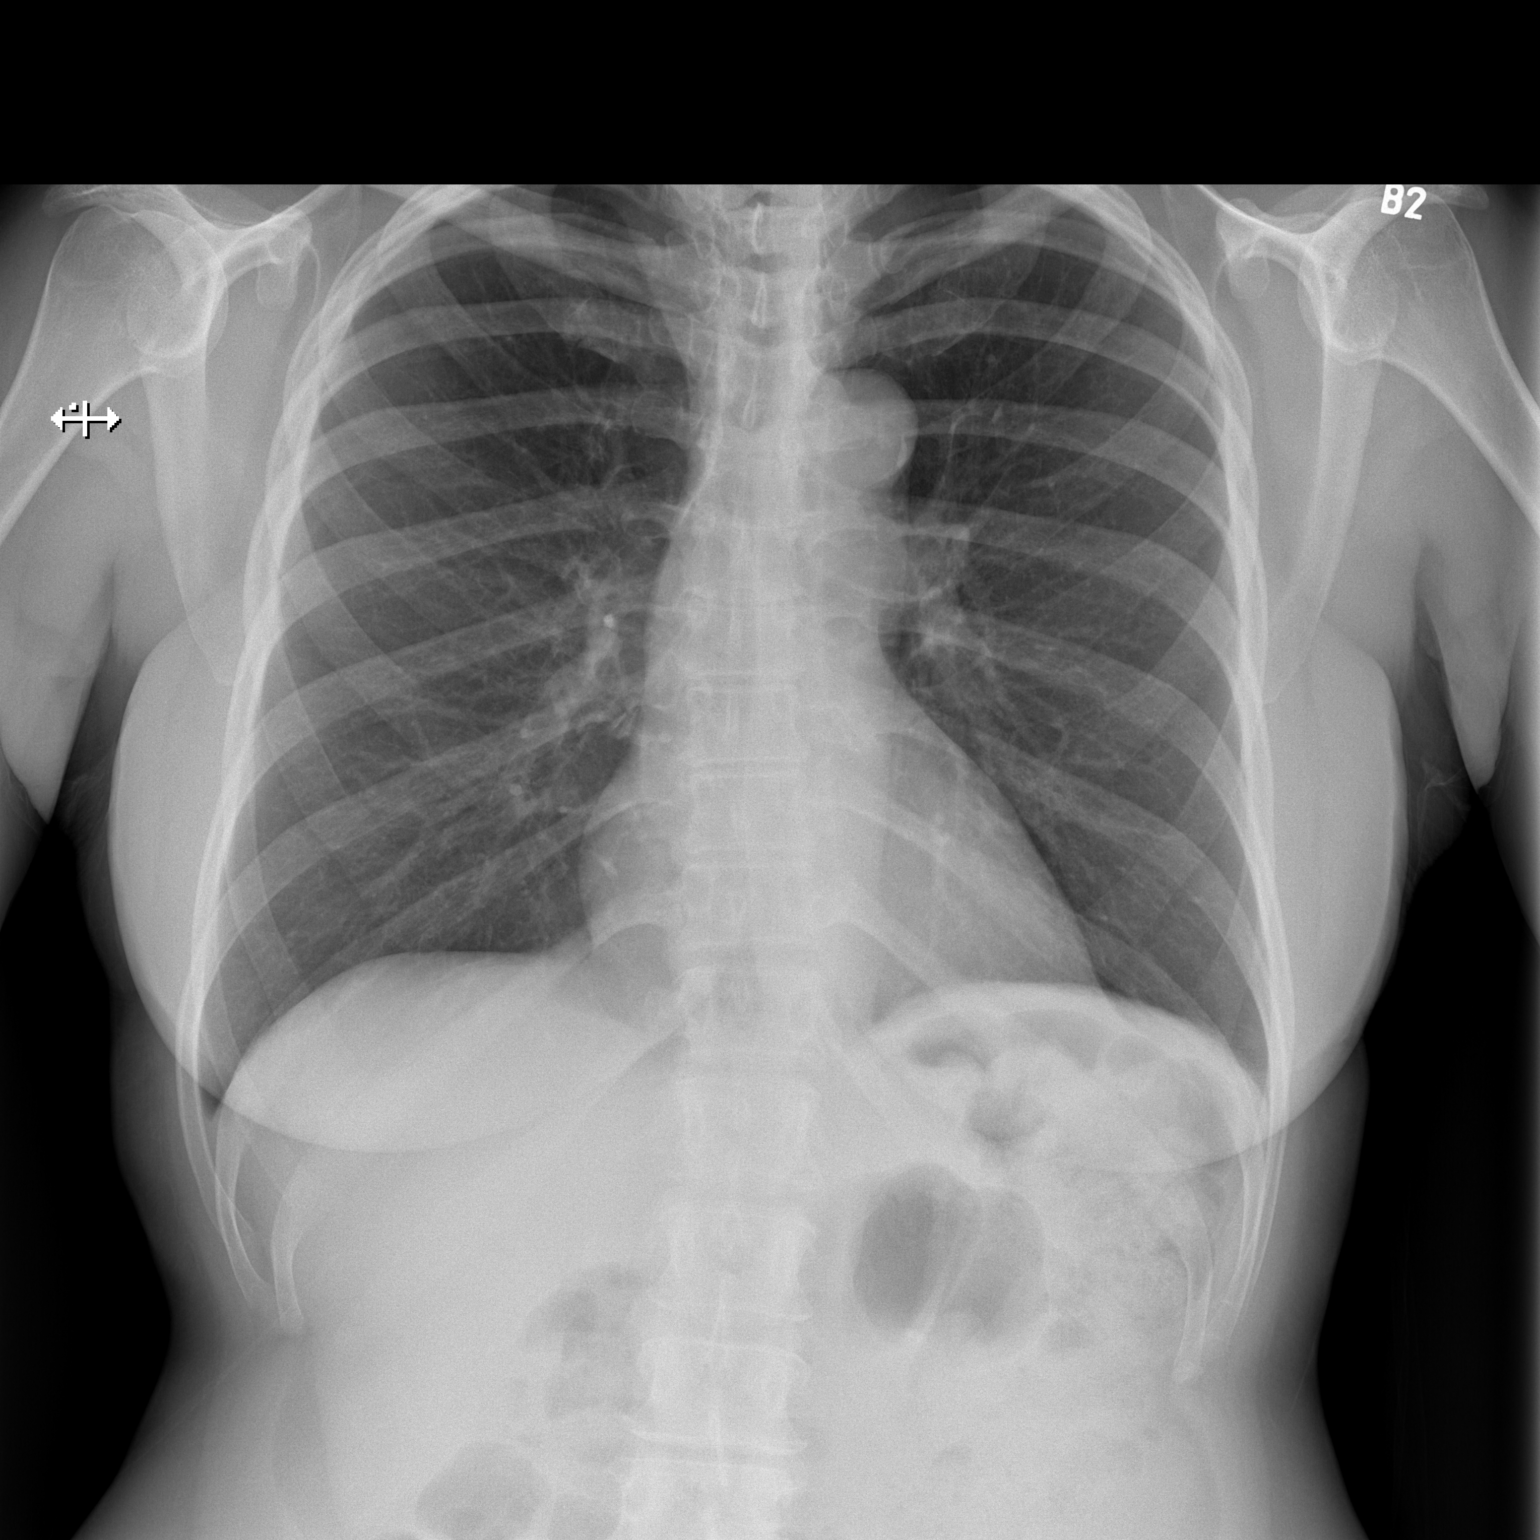

[w chest lat]
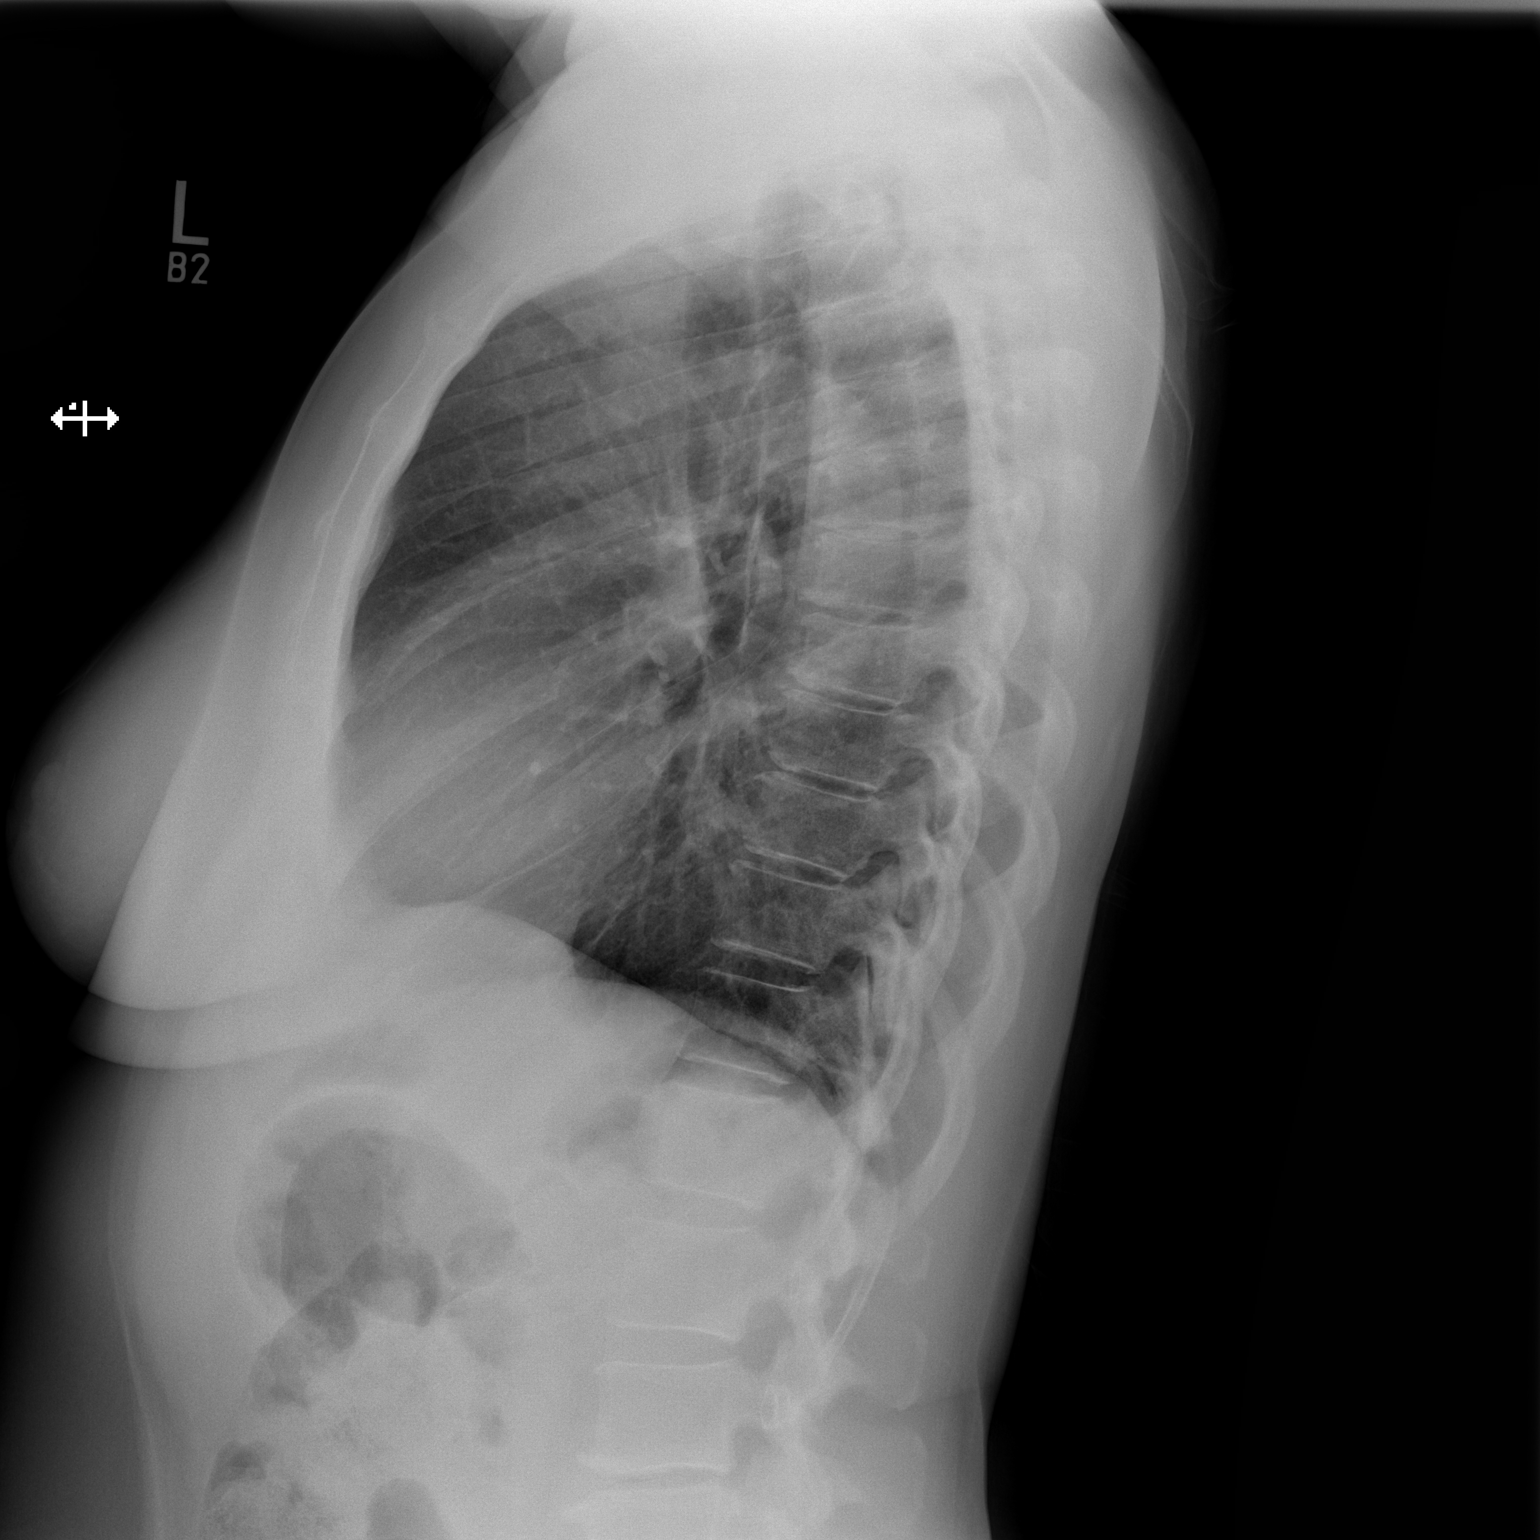

[2 of 2 positions shown; findings below may reference images not displayed]

FINDINGS: The heart size and mediastinal contours are within normal limits.
Both lungs are clear. Remote healed fracture deformity of the left
clavicle.
IMPRESSION: No active cardiopulmonary disease.

## 2020-05-17 ENCOUNTER — Other Ambulatory Visit: Payer: Self-pay

## 2020-05-17 ENCOUNTER — Telehealth: Payer: Self-pay

## 2020-05-17 ENCOUNTER — Ambulatory Visit (HOSPITAL_COMMUNITY)
Admission: RE | Admit: 2020-05-17 | Discharge: 2020-05-17 | Disposition: A | Discharge status: Home / Self Care | Payer: BC Managed Care – PPO | Source: Ambulatory Visit | Attending: Physician Assistant | Admitting: Physician Assistant

## 2020-05-17 ENCOUNTER — Ambulatory Visit (INDEPENDENT_AMBULATORY_CARE_PROVIDER_SITE_OTHER): Payer: BC Managed Care – PPO | Admitting: Physician Assistant

## 2020-05-17 ENCOUNTER — Encounter: Payer: Self-pay | Admitting: Physician Assistant

## 2020-05-17 VITALS — BP 129/90 | HR 69 | Resp 16 | Ht 65.5 in | Wt 170.0 lb

## 2020-05-17 DIAGNOSIS — M0579 Rheumatoid arthritis with rheumatoid factor of multiple sites without organ or systems involvement: Secondary | ICD-10-CM

## 2020-05-17 DIAGNOSIS — R7611 Nonspecific reaction to tuberculin skin test without active tuberculosis: Secondary | ICD-10-CM

## 2020-05-17 DIAGNOSIS — F17200 Nicotine dependence, unspecified, uncomplicated: Secondary | ICD-10-CM

## 2020-05-17 DIAGNOSIS — M19041 Primary osteoarthritis, right hand: Secondary | ICD-10-CM

## 2020-05-17 DIAGNOSIS — H15003 Unspecified scleritis, bilateral: Secondary | ICD-10-CM

## 2020-05-17 DIAGNOSIS — M19072 Primary osteoarthritis, left ankle and foot: Secondary | ICD-10-CM

## 2020-05-17 DIAGNOSIS — M0630 Rheumatoid nodule, unspecified site: Secondary | ICD-10-CM

## 2020-05-17 DIAGNOSIS — Z79899 Other long term (current) drug therapy: Secondary | ICD-10-CM

## 2020-05-17 DIAGNOSIS — M170 Bilateral primary osteoarthritis of knee: Secondary | ICD-10-CM

## 2020-05-17 DIAGNOSIS — L930 Discoid lupus erythematosus: Secondary | ICD-10-CM

## 2020-05-17 DIAGNOSIS — M19042 Primary osteoarthritis, left hand: Secondary | ICD-10-CM

## 2020-05-17 DIAGNOSIS — M19071 Primary osteoarthritis, right ankle and foot: Secondary | ICD-10-CM

## 2020-05-17 MED ORDER — PREDNISONE 5 MG PO TABS: ORAL_TABLET | ORAL | 0 refills | Status: DC

## 2020-05-17 NOTE — Telephone Encounter (Signed)
Please apply for humira, per Hazel Sams, PA-C. New start pending lab results. Thanks!   Consent obtained and sent to the scan center.

## 2020-05-17 NOTE — Telephone Encounter (Signed)
Patient left a voicemail stating "she has been in a lot of pain for the past 3 days" and requesting prescription of Prednisone.  Patient requested a return call ASAP.

## 2020-05-17 NOTE — Telephone Encounter (Signed)
LMOM for patient to come in for appt per Dr. Estanislado Pandy

## 2020-05-17 NOTE — Patient Instructions (Signed)
Standing Labs We placed an order today for your standing lab work.   Please have your standing labs drawn in 1 month and 3 months   If possible, please have your labs drawn 2 weeks prior to your appointment so that the provider can discuss your results at your appointment.  We have open lab daily Monday through Thursday from 1:30-4:30 PM and Friday from 1:30-4:00 PM at the office of Dr. Bo Merino, [facility].   Please be advised, all patients with office appointments requiring lab work will take precedents over walk-in lab work.  If possible, please come for your lab work on Monday and Friday afternoons, as you may experience shorter wait times. The office is located at [address], [place], [place], [place] [address] No appointment is necessary.   Labs are drawn by [facility]. Please bring your co-pay at the time of your lab draw.  You may receive a bill from [facility] for your lab work.  If you wish to have your labs drawn at another location, please call the office 24 hours in advance to send orders.  If you have any questions regarding directions or hours of operation,  please call [phone].   As a reminder, please drink plenty of water prior to coming for your lab work. Thanks!    Adalimumab Injection What is this medicine? ADALIMUMAB (ay da LIM yoo mab) is used to treat rheumatoid and psoriatic arthritis. It is also used to treat ankylosing spondylitis, Crohn's disease, ulcerative colitis, plaque psoriasis, hidradenitis suppurativa, and uveitis. This medicine may be used for other purposes; ask your health care provider or pharmacist if you have questions. COMMON BRAND NAME(S): CYLTEZO, Humira What should I tell my health care provider before I take this medicine? They need to know if you have any of these conditions:  cancer  diabetes (high blood sugar)  having surgery  heart disease  hepatitis B  immune system problems  infections, such as  tuberculosis (TB) or other bacterial, fungal, or viral infections  multiple sclerosis  recent or upcoming vaccine  an unusual reaction to adalimumab, mannitol, latex, rubber, other medicines, foods, dyes, or preservatives  pregnant or trying to get pregnant  breast-feeding How should I use this medicine? This medicine is for injection under the skin. You will be taught how to prepare and give it. Take it as directed on the prescription label. Keep taking it unless your health care provider tells you to stop. It is important that you put your used needles and syringes in a special sharps container. Do not put them in a trash can. If you do not have a sharps container, call your pharmacist or health care provider to get one. This medicine comes with INSTRUCTIONS FOR USE. Ask your pharmacist for directions on how to use this medicine. Read the information carefully. Talk to your pharmacist or health care provider if you have questions. A special MedGuide will be given to you by the pharmacist with each prescription and refill. Be sure to read this information carefully each time. Talk to your pediatrician regarding the use of this medicine in children. While this drug may be prescribed for children as young as 2 years for selected conditions, precautions do apply. Overdosage: If you think you have taken too much of this medicine contact a poison control center or emergency room at once. NOTE: This medicine is only for you. Do not share this medicine with others. What if I miss a dose? If you miss  a dose, take it as soon as you can. If it is almost time for your next dose, take only that dose. Do not take double or extra doses. It is important not to miss any doses. Talk to your health care provider about what to do if you miss a dose. What may interact with this medicine? Do not take this medicine with any of the following medications:  abatacept  anakinra  biologic medicines such as  certolizumab, etanercept, golimumab, infliximab  live virus vaccines This medicine may also interact with the following medications:  cyclosporine  theophylline  vaccines  warfarin This list may not describe all possible interactions. Give your health care provider a list of all the medicines, herbs, non-prescription drugs, or dietary supplements you use. Also tell them if you smoke, drink alcohol, or use illegal drugs. Some items may interact with your medicine. What should I watch for while using this medicine? Visit your health care provider for regular checks on your progress. Tell your health care provider if your symptoms do not start to get better or if they get worse. You will be tested for tuberculosis (TB) before you start this medicine. If your doctor prescribes any medicine for TB, you should start taking the TB medicine before starting this medicine. Make sure to finish the full course of TB medicine. This medicine may increase your risk of getting an infection. Call your health care provider for advice if you get a fever, chills, sore throat, or other symptoms of a cold or flu. Do not treat yourself. Try to avoid being around people who are sick. Talk to your health care provider about your risk of cancer. You may be more at risk for certain types of cancer if you take this medicine. What side effects may I notice from receiving this medicine? Side effects that you should report to your doctor or health care professional as soon as possible:  allergic reactions like skin rash, itching or hives, swelling of the face, lips, or tongue  changes in vision  chest pain  dizziness  heart failure (trouble breathing; fast, irregular heartbeat; sudden weight gain; swelling of the ankles, feet, hands; unusually weak or tired)  infection (fever, chills, cough, sore throat, pain or trouble passing urine)  liver injury (dark yellow or brown urine; general ill feeling or flu-like  symptoms; loss of appetite, right upper belly pain; unusually weak or tired, yellowing of the eyes or skin)  lump or swollen lymph nodes on the neck, groin, or underarm area  muscle weakness  pain, tingling, numbness in the hands or feet  red, scaly patches or raised bumps on the skin  trouble breathing  unusual bleeding or bruising  unusually weak or tired Side effects that usually do not require medical attention (report to your doctor or health care professional if they continue or are bothersome):  headache  nausea  pain, redness, or irritation at site where injected  stuffy or runny nose This list may not describe all possible side effects. Call your doctor for medical advice about side effects. You may report side effects to FDA at 1-800-FDA-1088. Where should I keep my medicine? Keep out of the reach of children and pets. Store in the refrigerator between 2 and 8 degrees C (36 and 46 degrees F). Do not freeze. Keep this medicine in the original packaging until you are ready to take it. Protect from light. Get rid of any unused medicine after the expiration date. This medicine may be  stored at room temperature for up to 14 days. Keep this medicine in the original packaging. Protect from light. If it is stored at room temperature, get rid of any unused medicine after 14 days or after it expires, whichever is first. To get rid of medicines that are no longer needed or have expired:  Take the medicine to a medicine take-back program. Check with your pharmacy or law enforcement to find a location.  If you cannot return the medicine, ask your pharmacist or health care provider how to get rid of this medicine safely. NOTE: This sheet is a summary. It may not cover all possible information. If you have questions about this medicine, talk to your doctor, pharmacist, or health care provider.  2021 Elsevier/Gold Standard (2019-05-06 17:28:40)

## 2020-05-17 NOTE — Progress Notes (Signed)
Office Visit Note  Patient: Samantha Hughes             Date of Birth: [birth year]           MRN: [id number]             PCP: Kelton Pillar, MD Referring: Kelton Pillar, MD Visit Date: 05/17/2020 Occupation: @GUAROCC @  Subjective:  Pain in multiple joints  History of Present Illness: Samantha Hughes is a 60 y.o. female with history of seropositive rheumatoid arthritis, scleritis, and discoid lupus.   She is currently on methotrexate 0.8 mL sq injections once weekly and folic acid 2 mg by mouth daily.  She has not missed any doses of methotrexate recently.  After her office visit in September 2021 she transition from oral methotrexate to injectable methotrexate.  She initially noticed significant improvement in her symptoms when switching to injectable methotrexate.  She has been experiencing increased pain in multiple joints for the past 3 days which has been severe and reminds her of the pain she was in upon initial diagnosis of rheumatoid arthritis.  She states that the flare was unprovoked and has progressively been worsening over the past several days.  She is currently having pain in both shoulder joints, right knee joint, and both feet.  She states that she has noticed intermittent swelling in her feet and ankle joints for the past 2 days.  She states that she woke up yesterday with pain in the right eye but has not noticed any redness or photophobia.  According to the patient she had 3-4 flares of scleritis in her right eye in 2021.  She has not been taking Celebrex recently due to the interaction with methotrexate. She denies any recurrence of discoid lesions since several years.    Activities of Daily Living:  Patient reports morning stiffness for all day.  Patient Reports nocturnal pain.  Difficulty dressing/grooming: Denies Difficulty climbing stairs: Reports Difficulty getting out of chair: Reports Difficulty using hands for taps, buttons, cutlery, and/or writing:  Denies  Review of Systems  Constitutional: Positive for fatigue.  HENT: Positive for mouth sores. Negative for mouth dryness and nose dryness.   Eyes: Positive for pain. Negative for itching and dryness.  Respiratory: Negative for shortness of breath and difficulty breathing.   Cardiovascular: Positive for swelling in legs/feet. Negative for chest pain and palpitations.  Gastrointestinal: Positive for constipation. Negative for blood in stool and diarrhea.  Endocrine: Negative for increased urination.  Genitourinary: Negative for difficulty urinating.  Musculoskeletal: Positive for arthralgias, joint pain and morning stiffness. Negative for joint swelling, myalgias, muscle tenderness and myalgias.  Allergic/Immunologic: Negative for susceptible to infections.  Neurological: Positive for headaches and weakness. Negative for dizziness, numbness and memory loss.  Hematological: Positive for bruising/bleeding tendency.  Psychiatric/Behavioral: Negative for confusion.    PMFS History:  Patient Active Problem List   Diagnosis Date Noted  . Smoker 07/08/2016  . 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 Rheumatoid arthritis with rheumatoid factor of multiple sites without organ or systems involvement ([place]) 02/06/2016  . Discoid lupus 02/06/2016  . Primary osteoarthritis of both hands 02/06/2016  . Primary osteoarthritis of both knees 02/06/2016  . 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 High risk medication use 02/06/2016  . Positive PPD, treated 02/06/2016    Past Medical History:  Diagnosis Date  . Depression   . Osteoarthritis   . Rheumatoid arthritis ([place])     Family History  Problem Relation  Age of Onset  . Diabetes Mother   . Hypertension Mother   . Diabetes Sister   . Diabetes Maternal Grandmother   . Cancer Maternal Grandfather    Past Surgical History:  Procedure Laterality Date  . eye lift    . ingrown toenail Bilateral    bilateral  great toes  . TUBAL LIGATION     Social History   Social History Narrative  . Not on file   Immunization History  Administered Date(s) Administered  . Moderna Sars-Covid-2 Vaccination 05/17/2019, 06/15/2019, 03/02/2020     Objective: Vital Signs: BP 129/90 (BP Location: Right Arm, Patient Position: Sitting, Cuff Size: Normal)   Pulse 69   Resp 16   Ht 5' 5.5" (1.664 m)   Wt 170 lb (77.1 kg)   BMI 27.86 kg/m    Physical Exam Vitals and nursing note reviewed.  Constitutional:      Appearance: She is well-developed and well-nourished.  HENT:     Head: Normocephalic and atraumatic.  Eyes:     Extraocular Movements: EOM normal.     Conjunctiva/sclera: Conjunctivae normal.  Cardiovascular:     Pulses: Intact distal pulses.  Pulmonary:     Effort: Pulmonary effort is normal.  Abdominal:     Palpations: Abdomen is soft.  Musculoskeletal:     Cervical back: Normal range of motion.  Skin:    General: Skin is warm and dry.     Capillary Refill: Capillary refill takes less than 2 seconds.  Neurological:     Mental Status: She is alert and oriented to person, place, and time.  Psychiatric:        Mood and Affect: Mood and affect normal.        Behavior: Behavior normal.      Musculoskeletal Exam:  C-spine has limited range of motion.  She has painful range of motion and tenderness to palpation over both shoulder joints.  Elbow joints have good range of motion with no tenderness or inflammation.  She has tenderness and synovitis over the right second MCP joint.  Painful range of motion of the right hip.  Painful range of motion of the right knee joint and right ankle joint.  She has tenderness and synovitis over the right second PIP joint.  CDAI Exam: CDAI Score: 6.3  Patient Global: 8 mm; Provider Global: 5 mm Swollen: 2 ; Tender: 8  Joint Exam 05/17/2020      Right  Left  Glenohumeral   Tender   Tender  MCP 2  Swollen Tender     Hip   Tender     Knee   Tender      Ankle   Tender   Tender  PIP 2 (toe)  Swollen Tender        Investigation: No additional findings.  Imaging: No results found.  Recent Labs: Lab Results  Component Value Date   WBC 4.4 02/23/2020   HGB 12.8 02/23/2020   PLT 254 02/23/2020   NA 142 02/23/2020   K 4.5 02/23/2020   CL 107 02/23/2020   CO2 30 02/23/2020   GLUCOSE 67 02/23/2020   BUN 18 02/23/2020   CREATININE 0.72 02/23/2020   BILITOT 0.4 02/23/2020   ALKPHOS 50 10/07/2016   AST 18 02/23/2020   ALT 11 02/23/2020   PROT 6.7 02/23/2020   ALBUMIN 4.2 10/07/2016   CALCIUM 9.6 02/23/2020   GFRAA 107 02/23/2020   QFTBGOLDPLUS POSITIVE (A) 02/12/2018    Speciality Comments: No specialty comments available.  Procedures:  No procedures performed Allergies: Arava [leflunomide] and Plaquenil [hydroxychloroquine sulfate]   Assessment / Plan:     Visit Diagnoses: Rheumatoid arthritis with rheumatoid factor of multiple sites without organ or systems involvement ([place]): Positive RF, positive anti-CCP: She presents today with increased joint pain, stiffness, and inflammation in multiple joints.  She started to have a flare about 3 days ago which was unprovoked and has progressively been worsening.  She has been compliant with methotrexate 0.8 mL sq injections once weekly and has not missed any doses recently.  She has not been taking Celebrex due to the interaction with methotrexate.  According to the patient the severity of her pain reminds her of the discomfort she was experiencing upon diagnosis of her rheumatoid arthritis initially.  She has been experiencing severe nocturnal pain and limited mobility throughout the day.  Her discomfort is most severe in both shoulder joints, right knee joint, and both feet.  She has noticed swelling in her toes first thing in the morning.  On examination today she has tenderness and synovitis over the right second MCP joint.  She has tenderness over the right ankle and tenderness and  synovitis over the second PIP joint of the right foot.  Of note she also woke up yesterday with pain in her right eye concerning for a scleritis flare.  According to the patient she had 3-4 flares of scleritis in her right eye in 2021 requiring prednisolone acetate eyedrop tapers prescribed by Dr. Sabra Heck.  She initially noticed significant clinical improvement switching from oral methotrexate to injectable methotrexate in September 2021.  She has continued to have intermittent arthralgias and joint stiffness over the past couple of months but the flare she is currently experiencing has been the most severe flare in several years.  Different treatment options were discussed today in detail.  Indications, contraindications, potential side effects of Humira were discussed.  All questions were addressed and consent was obtained.  We discussed that Humira is indicated for the treatment of rheumatoid arthritis as well as uveitis so it will help to manage her joint involvement as well as the recurrent flares in her right eye.  We discussed that due to her history of discoid lupus there was a concern for autoantibody formation while on Humira.  We will obtain the following autoimmune lab work today prior to initiating Humira.  We will also repeat this lab work 6 months after she is started on Humira.  We will apply for Humira through her insurance and once approved she will return to the office for administration of the first injection.  She voiced understanding.  She will follow up in 6-8 weeks to assess her response to combination therapy.  Counseled patient that Humira is a TNF blocking agent.  Counseled patient on purpose, proper use, and adverse effects of Humira.  Reviewed the most common adverse effects including infections, headache, and injection site reactions. Discussed that there is the possibility of an increased risk of malignancy but it is not well understood if this increased risk is due to the  medication or the disease state.  Advised patient to get yearly dermatology exams due to risk of skin cancer. Counseled patient that Humira should be held prior to scheduled surgery.  Counseled patient to avoid live vaccines while on Humira.  Advised patient to get annual influenza vaccine and the pneumococcal vaccine as indicated.    Reviewed the importance of regular labs while on Humira therapy.  Standing orders placed.  Provided  patient with medication education material and answered all questions.  Patient consented to Humira.  Will upload consent into the media tab.  Reviewed storage instructions of Humira.  Advised initial injection must be administered in office.  Patient verbalized understanding.  Dose will be for rheumatoid arthritis Humira 40 mg every 14 days.  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  She will continue on methotrexate 0.8 ml sq injections once weekly and folic acid 2 mg by mouth daily.  CBC and CMP within normal limits on 02/23/2020.  She is due to update lab work today.  Orders for CBC and CMP were released.  She will require updated lab work 1 month and every 3 months after starting on Humira.  Plan: CBC with Differential/Platelet, COMPLETE METABOLIC PANEL WITH GFR, DG Chest 2 View She had baseline immunosuppressive lab work on 02/12/2018.  Hepatitis B-, hepatitis C negative, HIV negative, SPEP did not reveal any monoclonal proteins, immunoglobulins were within normal limits.  TB gold was positive at that time.  She will require yearly chest x-rays.  Order for chest x-ray was placed today.  Discoid lupus -History of positive ANA-No recurrence of discoid lesions in several years.  She has no clinical features of systemic lupus. No autoimmune lab work to review in [facility].  We will obtain the following lab work today to further assess for systemic  lupus.  Discussed the risk of starting humira  plan: ANA, C3 and C4, Anti-DNA antibody, double-stranded, Sedimentation rate, RNP Antibody, Anti-scleroderma antibody, Anti-Smith antibody, Sjogrens syndrome-A extractable nuclear antibody, Sjogrens syndrome-B extractable nuclear antibody  Bilateral scleritis: Dr. Sabra Heck.  3-4 flares in 2021 requiring prednisolone acetate taper according to the patient.  Primary osteoarthritis of both hands: She has PIP and DIP thickening consistent with osteoarthritis of both hands.  She has synovitis over the right second MCP joint.  She was able to make a complete fist bilaterally but has been experiencing increased pain and stiffness in both hands for the past 3 days.  Primary osteoarthritis of both knees: She presents today with increased pain in the right knee joint.  She is experiencing warmth and fullness in the right knee.  She has painful range of motion of both knee joints with crepitus bilaterally.  Primary osteoarthritis of both feet: She has been experiencing severe pain in both feet and both ankle joints.  She has tenderness and synovitis over the right second PIP joint.  Positive PPD, treated -Treated with INH in the past. Order for CXR placed today.  Chest x-ray updated on 02/12/2018 which did not reveal any active cardiopulmonary disease.  Plan: DG Chest 2 View  Smoker    Orders: Orders Placed This Encounter  Procedures  . DG Chest 2 View  . CBC with Differential/Platelet  . COMPLETE METABOLIC PANEL WITH GFR  . ANA  . C3 and C4  . Anti-DNA antibody, double-stranded  . Sedimentation rate  . RNP Antibody  . Anti-scleroderma antibody  . Anti-Smith antibody  . Sjogrens syndrome-A extractable nuclear antibody  . Sjogrens syndrome-B extractable nuclear antibody   Meds ordered this encounter  Medications  . predniSONE (DELTASONE) 5 MG tablet    Sig: Take 4 tablets by mouth daily x4 days, 3 tablets by mouth daily x4 days, 2 tablets by  mouth daily x4 days, 1 tablet by mouth daily x4 days.    Dispense:  40 tablet  Refill:  0      Follow-Up Instructions: Return in about 6 weeks (around 06/28/2020) for Rheumatoid arthritis.   Ofilia Neas, PA-C  Note - This record has been created using Dragon software.  Chart creation errors have been sought, but may not always  have been located. Such creation errors do not reflect on  the standard of medical care.

## 2020-05-18 ENCOUNTER — Telehealth: Payer: Self-pay

## 2020-05-18 NOTE — Telephone Encounter (Signed)
Patient called stating she was returning your call regarding her labwork results.  Patient states she will have her phone with her until 10:15 am.  Patient states after that time, she is requesting a message be left on her voicemail.

## 2020-05-18 NOTE — Telephone Encounter (Addendum)
Received notification from [employer] regarding a prior authorization for [place]. Authorization has been APPROVED from 04/18/20 to 11/14/20.  Must fill through [facility] # [id number]

## 2020-05-18 NOTE — Progress Notes (Signed)
RBC count, hgb, and hct are borderline low.  Rest of CBC WNL.  ESR WNL  complements WNL.

## 2020-05-18 NOTE — Telephone Encounter (Signed)
Submitted a Prior Authorization request to [employer] for [place] via Cover My Meds. Will update once we receive a response.   Key: [id number] - PA Case ID: [id number]   Added patient in [employer] Complete pro portal- awaiting benefits review.

## 2020-05-19 NOTE — Progress Notes (Signed)
Chest x-rays normal.

## 2020-05-19 NOTE — Telephone Encounter (Signed)
ATC patient to schedule Humira new start. Will f/u next week

## 2020-05-19 NOTE — Telephone Encounter (Signed)
[facility] Complete Savings Card  Rx [id number] Rx [id number] Rx [id number] Rx [id number]

## 2020-05-20 LAB — COMPLETE METABOLIC PANEL WITH GFR
AG Ratio: 1.5 (calc) (ref 1.0–2.5)
ALT: 9 U/L (ref 6–29)
AST: 19 U/L (ref 10–35)
Albumin: 4 g/dL (ref 3.6–5.1)
Alkaline phosphatase (APISO): 51 U/L (ref 37–153)
BUN: 22 mg/dL (ref 7–25)
CO2: 26 mmol/L (ref 20–32)
Calcium: 9.8 mg/dL (ref 8.6–10.4)
Chloride: 107 mmol/L (ref 98–110)
Creat: 0.75 mg/dL (ref 0.50–1.05)
GFR, Est African American: 102 mL/min/{1.73_m2} (ref >=60)
GFR, Est Non African American: 88 mL/min/{1.73_m2} (ref >=60)
Globulin: 2.6 g/dL (calc) (ref 1.9–3.7)
Glucose, Bld: 78 mg/dL (ref 65–99)
Potassium: 4.2 mmol/L (ref 3.5–5.3)
Sodium: 143 mmol/L (ref 135–146)
Total Bilirubin: 0.3 mg/dL (ref 0.2–1.2)
Total Protein: 6.6 g/dL (ref 6.1–8.1)

## 2020-05-20 LAB — CBC WITH DIFFERENTIAL/PLATELET
Absolute Monocytes: 355 cells/uL (ref 200–950)
Basophils Absolute: 30 cells/uL (ref 0–200)
Basophils Relative: 0.6 %
Eosinophils Absolute: 260 cells/uL (ref 15–500)
Eosinophils Relative: 5.2 %
HCT: 34.6 % — ABNORMAL LOW (ref 35.0–45.0)
Hemoglobin: 11.6 g/dL — ABNORMAL LOW (ref 11.7–15.5)
Lymphs Abs: 1925 cells/uL (ref 850–3900)
MCH: 31.3 pg (ref 27.0–33.0)
MCHC: 33.5 g/dL (ref 32.0–36.0)
MCV: 93.3 fL (ref 80.0–100.0)
MPV: 11 fL (ref 7.5–12.5)
Monocytes Relative: 7.1 %
Neutro Abs: 2430 cells/uL (ref 1500–7800)
Neutrophils Relative %: 48.6 %
Platelets: 243 10*3/uL (ref 140–400)
RBC: 3.71 10*6/uL — ABNORMAL LOW (ref 3.80–5.10)
RDW: 12.6 % (ref 11.0–15.0)
Total Lymphocyte: 38.5 %
WBC: 5 10*3/uL (ref 3.8–10.8)

## 2020-05-20 LAB — C3 AND C4
C3 Complement: 128 mg/dL (ref 83–193)
C4 Complement: 31 mg/dL (ref 15–57)

## 2020-05-20 LAB — ANA: Anti Nuclear Antibody (ANA): POSITIVE — AB

## 2020-05-20 LAB — SEDIMENTATION RATE: Sed Rate: 22 mm/h (ref 0–30)

## 2020-05-20 LAB — RNP ANTIBODY: Ribonucleic Protein(ENA) Antibody, IgG: <1 AI

## 2020-05-20 LAB — SJOGRENS SYNDROME-B EXTRACTABLE NUCLEAR ANTIBODY: SSB (La) (ENA) Antibody, IgG: <1 AI

## 2020-05-20 LAB — SJOGRENS SYNDROME-A EXTRACTABLE NUCLEAR ANTIBODY: SSA (Ro) (ENA) Antibody, IgG: <1 AI

## 2020-05-20 LAB — ANTI-DNA ANTIBODY, DOUBLE-STRANDED: ds DNA Ab: <1 [IU]/mL

## 2020-05-20 LAB — ANTI-SMITH ANTIBODY: ENA SM Ab Ser-aCnc: <1 AI

## 2020-05-20 LAB — ANTI-NUCLEAR AB-TITER (ANA TITER): ANA Titer 1: 1:80 {titer} — ABNORMAL HIGH

## 2020-05-20 LAB — ANTI-SCLERODERMA ANTIBODY: Scleroderma (Scl-70) (ENA) Antibody, IgG: <1 AI

## 2020-05-22 NOTE — Telephone Encounter (Signed)
Patient scheduled for tomorrow 05/23/20 at 9:00 am.

## 2020-05-23 ENCOUNTER — Other Ambulatory Visit: Payer: Self-pay

## 2020-05-23 ENCOUNTER — Ambulatory Visit: Payer: BC Managed Care – PPO | Admitting: Pharmacist

## 2020-05-23 VITALS — BP 138/91 | HR 86

## 2020-05-23 DIAGNOSIS — M0579 Rheumatoid arthritis with rheumatoid factor of multiple sites without organ or systems involvement: Secondary | ICD-10-CM

## 2020-05-23 DIAGNOSIS — M0630 Rheumatoid nodule, unspecified site: Secondary | ICD-10-CM

## 2020-05-23 DIAGNOSIS — Z79899 Other long term (current) drug therapy: Secondary | ICD-10-CM

## 2020-05-23 MED ORDER — HUMIRA (2 PEN) 40 MG/0.4ML ~~LOC~~ AJKT: 40.0000 mg | AUTO-INJECTOR | SUBCUTANEOUS | 0 refills | Status: DC

## 2020-05-23 NOTE — Progress Notes (Signed)
ANA is positive but low, nonspecific titer.  ENA negative.    Patient started on Humira today in the office. She will need to return for CBC and CMP in 1 month then every 3 months.

## 2020-05-23 NOTE — Progress Notes (Signed)
Pharmacy Note  Subjective:   Patient presents to clinic today to receive first dose of Humira. Patient is naive to biologics.   PMH signfiicant for seropositive rheumatoid arthritis, scleritis, and discoid lupus.  Patient was previously taking hydroxychloroquine (stopped d/t decreased WBC), leflunomide (stopped d/t urinary incontinence)  Patient running a fever or have signs/symptoms of infection? No  Patient currently on antibiotics for the treatment of infection? No  Patient have any upcoming invasive procedures/surgeries? No  Objective: CMP     Component Value Date/Time   NA 143 05/17/2020 1532   K 4.2 05/17/2020 1532   CL 107 05/17/2020 1532   CO2 26 05/17/2020 1532   GLUCOSE 78 05/17/2020 1532   BUN 22 05/17/2020 1532   CREATININE 0.75 05/17/2020 1532   CALCIUM 9.8 05/17/2020 1532   PROT 6.6 05/17/2020 1532   ALBUMIN 4.2 10/07/2016 1316   AST 19 05/17/2020 1532   ALT 9 05/17/2020 1532   ALKPHOS 50 10/07/2016 1316   BILITOT 0.3 05/17/2020 1532   GFRNONAA 88 05/17/2020 1532   GFRAA 102 05/17/2020 1532    CBC    Component Value Date/Time   WBC 5.0 05/17/2020 1532   RBC 3.71 (L) 05/17/2020 1532   HGB 11.6 (L) 05/17/2020 1532   HCT 34.6 (L) 05/17/2020 1532   PLT 243 05/17/2020 1532   MCV 93.3 05/17/2020 1532   MCH 31.3 05/17/2020 1532   MCHC 33.5 05/17/2020 1532   RDW 12.6 05/17/2020 1532   LYMPHSABS 1,925 05/17/2020 1532   MONOABS 300 10/07/2016 1316   EOSABS 260 05/17/2020 1532   BASOSABS 30 05/17/2020 1532    Baseline Immunosuppressant Therapy Labs TB GOLD Quantiferon TB Gold Latest Ref Rng & Units 02/12/2018  Quantiferon TB Gold Plus NEGATIVE POSITIVE(A)   Patient has had positive TB gold and will require yearly chest x-ray.  Hepatitis Panel Hepatitis Latest Ref Rng & Units 02/12/2018  Hep B Surface Ag NON-REACTI NON-REACTIVE  Hep B IgM NON-REACTI NON-REACTIVE  Hep C Ab NON-REACTI NON-REACTIVE  Hep C Ab NON-REACTI NON-REACTIVE   HIV Lab Results   Component Value Date   HIV NON-REACTIVE 02/12/2018   Immunoglobulins Immunoglobulin Electrophoresis Latest Ref Rng & Units 02/12/2018  IgA  47 - 310 mg/dL 249  IgG 600 - 1,640 mg/dL 1,190  IgM 50 - 300 mg/dL 111   SPEP Serum Protein Electrophoresis Latest Ref Rng & Units 05/17/2020  Total Protein 6.1 - 8.1 g/dL 6.6  Albumin 3.8 - 4.8 g/dL -  Alpha-1 0.2 - 0.3 g/dL -  Alpha-2 0.5 - 0.9 g/dL -  Beta Globulin 0.4 - 0.6 g/dL -  Beta 2 0.2 - 0.5 g/dL -  Gamma Globulin 0.8 - 1.7 g/dL -   Chest x-ray: 05/17/20 - wnl  Assessment/Plan:   Counseled patient that Humira is a TNF blocking agent.  Counseled patient on purpose, proper use, and adverse effects of Humira.  Reviewed the most common adverse effects including infections, headache, and injection site reactions. Discussed that there is the possibility of an increased risk of malignancy but it is not well understood if this increased risk is due to the medication or the disease state.  Advised patient to get yearly dermatology exams due to risk of skin cancer. Dermatology referral placed today.  Counseled patient that Humira should be held prior to scheduled surgery.  Counseled patient to avoid live vaccines while on Humira.  Advised patient to get annual influenza vaccine and the pneumococcal vaccine as indicated.    Reviewed the importance  of regular labs while on Humira therapy.  Standing orders placed.  Provided patient with medication education material and answered all questions.  Patient consented to Humira today.  Will upload consent into the media tab.  Reviewed storage instructions of Humira.  Demonstrated proper injection technique with Humira demo device  Patient able to demonstrate proper injection technique using the teach back method.  Patient self injected in the right thigh with:  Sample Medication: Humira 40mg /0.54mL auto-injector pen NDC: [id number] Lot: [id number] Expiration: 03/2021  Patient tolerated well.  Observed  for 30 mins in office for adverse reaction.   Patient will take Humira 40 mg every 14 days (first dose today), then 06/06/20.  She will continue on methotrexate 0.29mL (20mg ) every week with folic acid 2 mg daily.  Patient is to return in 1 month for labs and 6-8 weeks for follow-up appointment.  Standing orders for CBC with diff/platelet and CMP with GFR already in place.  Patient does not get TB gold yearly d/t history of positive result - patient has yearly chest exams. Next due March 2023.  Humira approved through insurance.  Prescription sent to [facility]. Patient provided with pharmacy phone number to schedule shipment.  All questions encouraged and answered.  Instructed patient to call with any further questions or concerns.  Knox Saliva, PharmD, MPH Clinical Pharmacist (Rheumatology and Pulmonology)  05/23/2020 8:04 AM

## 2020-05-23 NOTE — Patient Instructions (Incomplete)
Your next Humira dose is due 06/06/20, 06/20/20, and every 14 days thereafter. Set a reminder in your calendar/phone!  Your Humira will come from [facility]. Please call to schedule shipment - they will mail the medication to your home. Their phone number is [phone].  Your copay should be no more than $5 a month. If you call the pharmacy and the copay is higher than that, please make sure they are also billing through your copay card. The information is as follows:    ID:    [id number]   GROUP:     [id number]   BIN:       [id number]   PCN:     [id number]  Remember the 5 C's:  COUNTER - leave on the counter at least 30 minutes but up to overnight to bring medication to room temperature. This may help prevent stinging  COLD - place something cold (like an ice gel pack or cold water bottle) on the injection site just before cleansing with alcohol. This may help reduce pain  CLARITIN - use Claritin (generic name is loratadine) for the first two weeks of treatment or the day of, the day before, and the day after injecting. This will help to minimize injection site reactions  CORTISONE CREAM - apply if injection site is irritated and itching  CALL ME - if injection site reaction is bigger than the size of your fist, looks infected, blisters, or if you develop hives  Standing Labs We placed an order today for your standing lab work.   Please have your standing labs drawn in 1 month then every 3 months  If possible, please have your labs drawn 2 weeks prior to your appointment so that the provider can discuss your results at your appointment.  We have open lab daily Monday through Thursday from 1:30-4:30 PM and Friday from 1:30-4:00 PM at the office of Dr. Bo Merino, [facility].   Please be advised, all patients with office appointments requiring lab work will take precedents over walk-in lab work.  If possible, please come for your lab work on Monday and  Friday afternoons, as you may experience shorter wait times. The office is located at [address], [place], [place], [place] [address] No appointment is necessary.   Labs are drawn by [facility]. Please bring your co-pay at the time of your lab draw.  You may receive a bill from [facility] for your lab work.  If you wish to have your labs drawn at another location, please call the office 24 hours in advance to send orders.  If you have any questions regarding directions or hours of operation,  please call [phone].   As a reminder, please drink plenty of water prior to coming for your lab work. Thanks!     Adalimumab Injection What is this medicine? ADALIMUMAB (ay da LIM yoo mab) is used to treat rheumatoid and psoriatic arthritis. It is also used to treat ankylosing spondylitis, Crohn's disease, ulcerative colitis, plaque psoriasis, hidradenitis suppurativa, and uveitis. This medicine may be used for other purposes; ask your health care provider or pharmacist if you have questions. COMMON BRAND NAME(S): CYLTEZO, Humira What should I tell my health care provider before I take this medicine? They need to know if you have any of these conditions:  cancer  diabetes (high blood sugar)  having surgery  heart disease  hepatitis B  immune system problems  infections, such as tuberculosis (TB) or other bacterial, fungal, or viral  infections  multiple sclerosis  recent or upcoming vaccine  an unusual reaction to adalimumab, mannitol, latex, rubber, other medicines, foods, dyes, or preservatives  pregnant or trying to get pregnant  breast-feeding How should I use this medicine? This medicine is for injection under the skin. You will be taught how to prepare and give it. Take it as directed on the prescription label. Keep taking it unless your health care provider tells you to stop. It is important that you put your used needles and syringes in a special sharps container. Do not  put them in a trash can. If you do not have a sharps container, call your pharmacist or health care provider to get one. This medicine comes with INSTRUCTIONS FOR USE. Ask your pharmacist for directions on how to use this medicine. Read the information carefully. Talk to your pharmacist or health care provider if you have questions. A special MedGuide will be given to you by the pharmacist with each prescription and refill. Be sure to read this information carefully each time. Talk to your pediatrician regarding the use of this medicine in children. While this drug may be prescribed for children as young as 2 years for selected conditions, precautions do apply. Overdosage: If you think you have taken too much of this medicine contact a poison control center or emergency room at once. NOTE: This medicine is only for you. Do not share this medicine with others. What if I miss a dose? If you miss a dose, take it as soon as you can. If it is almost time for your next dose, take only that dose. Do not take double or extra doses. It is important not to miss any doses. Talk to your health care provider about what to do if you miss a dose. What may interact with this medicine? Do not take this medicine with any of the following medications:  abatacept  anakinra  biologic medicines such as certolizumab, etanercept, golimumab, infliximab  live virus vaccines This medicine may also interact with the following medications:  cyclosporine  theophylline  vaccines  warfarin This list may not describe all possible interactions. Give your health care provider a list of all the medicines, herbs, non-prescription drugs, or dietary supplements you use. Also tell them if you smoke, drink alcohol, or use illegal drugs. Some items may interact with your medicine. What should I watch for while using this medicine? Visit your health care provider for regular checks on your progress. Tell your health care provider  if your symptoms do not start to get better or if they get worse. You will be tested for tuberculosis (TB) before you start this medicine. If your doctor prescribes any medicine for TB, you should start taking the TB medicine before starting this medicine. Make sure to finish the full course of TB medicine. This medicine may increase your risk of getting an infection. Call your health care provider for advice if you get a fever, chills, sore throat, or other symptoms of a cold or flu. Do not treat yourself. Try to avoid being around people who are sick. Talk to your health care provider about your risk of cancer. You may be more at risk for certain types of cancer if you take this medicine. What side effects may I notice from receiving this medicine? Side effects that you should report to your doctor or health care professional as soon as possible:  allergic reactions like skin rash, itching or hives, swelling of the face, lips, or  tongue  changes in vision  chest pain  dizziness  heart failure (trouble breathing; fast, irregular heartbeat; sudden weight gain; swelling of the ankles, feet, hands; unusually weak or tired)  infection (fever, chills, cough, sore throat, pain or trouble passing urine)  liver injury (dark yellow or brown urine; general ill feeling or flu-like symptoms; loss of appetite, right upper belly pain; unusually weak or tired, yellowing of the eyes or skin)  lump or swollen lymph nodes on the neck, groin, or underarm area  muscle weakness  pain, tingling, numbness in the hands or feet  red, scaly patches or raised bumps on the skin  trouble breathing  unusual bleeding or bruising  unusually weak or tired Side effects that usually do not require medical attention (report to your doctor or health care professional if they continue or are bothersome):  headache  nausea  pain, redness, or irritation at site where injected  stuffy or runny nose This list may  not describe all possible side effects. Call your doctor for medical advice about side effects. You may report side effects to FDA at 1-800-FDA-1088. Where should I keep my medicine? Keep out of the reach of children and pets. Store in the refrigerator between 2 and 8 degrees C (36 and 46 degrees F). Do not freeze. Keep this medicine in the original packaging until you are ready to take it. Protect from light. Get rid of any unused medicine after the expiration date. This medicine may be stored at room temperature for up to 14 days. Keep this medicine in the original packaging. Protect from light. If it is stored at room temperature, get rid of any unused medicine after 14 days or after it expires, whichever is first. To get rid of medicines that are no longer needed or have expired:  Take the medicine to a medicine take-back program. Check with your pharmacy or law enforcement to find a location.  If you cannot return the medicine, ask your pharmacist or health care provider how to get rid of this medicine safely. NOTE: This sheet is a summary. It may not cover all possible information. If you have questions about this medicine, talk to your doctor, pharmacist, or health care provider.  2021 Elsevier/Gold Standard (2019-05-06 17:28:40)

## 2020-05-26 ENCOUNTER — Telehealth: Payer: Self-pay

## 2020-05-26 NOTE — Telephone Encounter (Signed)
Please advise. Thank you

## 2020-05-26 NOTE — Telephone Encounter (Signed)
Patient left a voicemail stating she reached out to [facility] and they sent a fax over to Dr. Estanislado Pandy in reference to her diagnosis of Lupus and that it is okay for me to take Humira.  They need that approval before they start the shipping process.  Patient requested a return call to let her know what her next steps are.

## 2020-05-26 NOTE — Telephone Encounter (Addendum)
[facility] - spoke with pharmacist Mardene Celeste) and advised that we are aware of patient's discoid lupus and we will monitor autoimmune labs in 6 months after starting Humira.   ATC patient to re-discuss that there is a risk for antibody formation with Humira (medication class effect with TNFinhibitors) and we will monitor the autoimmune labs in 6 months. We will check CBC and CMP in 1 month and then every 3 months. Left VM and advised patient to reach out to clinic if any issues but she should otherwise be cleared to schedule Humira shipment to her home.  Knox Saliva, PharmD, MPH Clinical Pharmacist (Rheumatology and Pulmonology)

## 2020-06-14 ENCOUNTER — Ambulatory Visit: Payer: BC Managed Care – PPO | Admitting: Physician Assistant

## 2020-06-19 ENCOUNTER — Other Ambulatory Visit: Payer: Self-pay

## 2020-06-19 DIAGNOSIS — Z79899 Other long term (current) drug therapy: Secondary | ICD-10-CM | POA: Diagnosis not present

## 2020-06-20 LAB — CBC WITH DIFFERENTIAL/PLATELET
Absolute Monocytes: 319 cells/uL (ref 200–950)
Basophils Absolute: 40 cells/uL (ref 0–200)
Basophils Relative: 0.7 %
Eosinophils Absolute: 251 cells/uL (ref 15–500)
Eosinophils Relative: 4.4 %
HCT: 36.5 % (ref 35.0–45.0)
Hemoglobin: 11.8 g/dL (ref 11.7–15.5)
Lymphs Abs: 2759 cells/uL (ref 850–3900)
MCH: 30.9 pg (ref 27.0–33.0)
MCHC: 32.3 g/dL (ref 32.0–36.0)
MCV: 95.5 fL (ref 80.0–100.0)
MPV: 11.4 fL (ref 7.5–12.5)
Monocytes Relative: 5.6 %
Neutro Abs: 2331 cells/uL (ref 1500–7800)
Neutrophils Relative %: 40.9 %
Platelets: 244 10*3/uL (ref 140–400)
RBC: 3.82 10*6/uL (ref 3.80–5.10)
RDW: 13.4 % (ref 11.0–15.0)
Total Lymphocyte: 48.4 %
WBC: 5.7 10*3/uL (ref 3.8–10.8)

## 2020-06-20 LAB — COMPLETE METABOLIC PANEL WITH GFR
AG Ratio: 1.8 (calc) (ref 1.0–2.5)
ALT: 10 U/L (ref 6–29)
AST: 21 U/L (ref 10–35)
Albumin: 4.2 g/dL (ref 3.6–5.1)
Alkaline phosphatase (APISO): 51 U/L (ref 37–153)
BUN: 20 mg/dL (ref 7–25)
CO2: 22 mmol/L (ref 20–32)
Calcium: 9.5 mg/dL (ref 8.6–10.4)
Chloride: 108 mmol/L (ref 98–110)
Creat: 0.86 mg/dL (ref 0.50–1.05)
GFR, Est African American: 86 mL/min/{1.73_m2} (ref >=60)
GFR, Est Non African American: 74 mL/min/{1.73_m2} (ref >=60)
Globulin: 2.4 g/dL (calc) (ref 1.9–3.7)
Glucose, Bld: 87 mg/dL (ref 65–99)
Potassium: 3.7 mmol/L (ref 3.5–5.3)
Sodium: 140 mmol/L (ref 135–146)
Total Bilirubin: 0.3 mg/dL (ref 0.2–1.2)
Total Protein: 6.6 g/dL (ref 6.1–8.1)

## 2020-06-20 NOTE — Progress Notes (Signed)
CBC and CMP WNL

## 2020-07-04 NOTE — Progress Notes (Signed)
Office Visit Note  Patient: Samantha Hughes             Date of Birth: 17-Jun-1960           MRN: [id number]             PCP: Kelton Pillar, MD Referring: Kelton Pillar, MD Visit Date: 07/18/2020 Occupation: @GUAROCC @  Subjective:  Fatigue   History of Present Illness: Samantha Hughes is a 60 y.o. female with history of rheumatoid arthritis and osteoarthritis.  Patient reports that she was diagnosed with COVID-19 on 07/10/2020.  She states that she took paxlovid which improved her symptoms.  She is not having any shortness of breath at this time.  She continues to have some residual fatigue but she was able to return to work yesterday.  She states that she missed her dose of Humira and methotrexate last week and is unsure when to resume these medications.  She states that she is not having any increased joint pain or joint swelling since missing the dose of Humira and methotrexate.  She denies any joint swelling or morning stiffness at this time.  She denies any scleritis flares.  She has not had any discoid lesions.  She denies any other new questions or concerns.     Activities of Daily Living:  Patient reports morning stiffness for 0 minutes.   Patient Denies nocturnal pain.  Difficulty dressing/grooming: Denies Difficulty climbing stairs: Denies Difficulty getting out of chair: Denies Difficulty using hands for taps, buttons, cutlery, and/or writing: Denies  Review of Systems  Constitutional: Positive for fatigue.  HENT: Negative for mouth sores, mouth dryness and nose dryness.   Eyes: Negative for pain, itching and dryness.  Respiratory: Negative for shortness of breath and difficulty breathing.   Cardiovascular: Negative for chest pain and palpitations.  Gastrointestinal: Positive for constipation. Negative for blood in stool and diarrhea.  Endocrine: Negative for increased urination.  Genitourinary: Negative for difficulty urinating.  Musculoskeletal: Negative for  arthralgias, joint pain, joint swelling, myalgias, morning stiffness, muscle tenderness and myalgias.  Skin: Negative for color change, rash and redness.  Allergic/Immunologic: Negative for susceptible to infections.  Neurological: Positive for headaches. Negative for dizziness, numbness and memory loss.  Hematological: Positive for bruising/bleeding tendency.  Psychiatric/Behavioral: Negative for confusion.    PMFS History:  Patient Active Problem List   Diagnosis Date Noted  . Smoker 07/08/2016  . 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 Rheumatoid arthritis with rheumatoid factor of multiple sites without organ or systems involvement ([place]) 02/06/2016  . Discoid lupus 02/06/2016  . Primary osteoarthritis of both hands 02/06/2016  . Primary osteoarthritis of both knees 02/06/2016  . 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 High risk medication use 02/06/2016  . Positive PPD, treated 02/06/2016    Past Medical History:  Diagnosis Date  . Depression   . Osteoarthritis   . Rheumatoid arthritis ([place])     Family History  Problem Relation Age of Onset  . Diabetes Mother   . Hypertension Mother   . Diabetes Sister   . Diabetes Maternal Grandmother   . Cancer Maternal Grandfather    Past Surgical History:  Procedure Laterality Date  . eye lift    . ingrown toenail Bilateral    bilateral great toes  . TUBAL LIGATION     Social History   Social History Narrative  . Not on file   Immunization History  Administered Date(s) Administered  . [place] Sars-Covid-2 Vaccination  05/17/2019, 06/15/2019, 03/02/2020     Objective: Vital Signs: BP 126/83 (BP Location: Left Arm, Patient Position: Sitting, Cuff Size: Normal)   Pulse 82   Resp 14   Ht 5' 5.5" (1.664 m)   Wt 167 lb 12.8 oz (76.1 kg)   BMI 27.50 kg/m    Physical Exam Vitals and nursing note reviewed.  Constitutional:      Appearance: She is well-developed.  HENT:     Head:  Normocephalic and atraumatic.  Eyes:     Conjunctiva/sclera: Conjunctivae normal.  Pulmonary:     Effort: Pulmonary effort is normal.  Abdominal:     Palpations: Abdomen is soft.  Musculoskeletal:     Cervical back: Normal range of motion.  Skin:    General: Skin is warm and dry.     Capillary Refill: Capillary refill takes less than 2 seconds.  Neurological:     Mental Status: She is alert and oriented to person, place, and time.  Psychiatric:        Behavior: Behavior normal.      Musculoskeletal Exam: C-spine, thoracic spine, and lumbar spine good ROM.  Shoulder joints, elbow joints, wrist joints, MCPs, PIPs, and DIPs good ROM with no synovitis.  Complete fist formation bilaterally.  PIP and DIP thickening consistent with osteoarthritis of both hands noted.  Hip joints, knee joints, and ankle joints have good range of motion with no discomfort.  No warmth or effusion of knee joints noted.  No tenderness or swelling of ankle joints.  No tenderness over trochanter bursa bilaterally.  CDAI Exam: CDAI Score: 0  Patient Global: 0 mm; Provider Global: 0 mm Swollen: 0 ; Tender: 0  Joint Exam 07/18/2020   No joint exam has been documented for this visit   There is currently no information documented on the homunculus. Go to the Rheumatology activity and complete the homunculus joint exam.  Investigation: No additional findings.  Imaging: No results found.  Recent Labs: Lab Results  Component Value Date   WBC 5.7 06/19/2020   HGB 11.8 06/19/2020   PLT 244 06/19/2020   NA 140 06/19/2020   K 3.7 06/19/2020   CL 108 06/19/2020   CO2 22 06/19/2020   GLUCOSE 87 06/19/2020   BUN 20 06/19/2020   CREATININE 0.86 06/19/2020   BILITOT 0.3 06/19/2020   ALKPHOS 50 10/07/2016   AST 21 06/19/2020   ALT 10 06/19/2020   PROT 6.6 06/19/2020   ALBUMIN 4.2 10/07/2016   CALCIUM 9.5 06/19/2020   GFRAA 86 06/19/2020   QFTBGOLDPLUS POSITIVE (A) 02/12/2018    Speciality Comments: No  specialty comments available.  Procedures:  No procedures performed Allergies: Arava [leflunomide] and Plaquenil [hydroxychloroquine sulfate]   Assessment / Plan:     Visit Diagnoses: Rheumatoid arthritis with rheumatoid factor of multiple sites without organ or systems involvement ([place]) - Positive RF, positive anti-CCP: She has no joint tenderness or synovitis on exam.  She has not had any recent rheumatoid arthritis flares.  She is currently prescribed Humira 40 mg subcutaneous injections every 14 days, methotrexate 0.8 mL subcu injections once weekly, folic acid 2 mg by mouth daily.  She was initially started on Humira on 05/23/2020.  She is currently holding both medications due to being diagnosed with COVID-19 on 07/10/2020. She was prescribed paxlovid and her symptoms have resolved.  She continues to have some residual fatigue but was able to return to work yesterday.  She was advised to continue to hold Humira and methotrexate for 1-2  weeks.  She has not had any increased joint pain or inflammation since holding her dose of Humira and methotrexate.  Overall she has found the combination to be effective at managing her rheumatoid arthritis as well as scleritis.  She will continue on the current treatment regimen once she is able to resume both medications 1-2 weeks.  She will follow-up in the office in 3 months to reassess her response to combination therapy.  High risk medication use - Humira 40 mg sq injections every 14 days-Started on 05/23/20, methotrexate 0.8 ml sq injections once weekly, and folic acid 2 mg by mouth daily.  CBC and CMP within normal limits on 06/19/2020.  We will update lab work today.  Her next lab work will be due in August and every 3 months to monitor for drug toxicity.  Standing orders for CBC and CMP remain in place.  Chest x-ray obtained on 05/17/2020 did not reveal any active cardiopulmonary disease.- Plan: COMPLETE METABOLIC PANEL WITH GFR, CBC with Differential/Platelet She  has had 3 moderna covid-19 vaccines.  She was diagnosed with COVID-19 on 07/10/2020.  She took paxlovid as prescribed.  Her symptoms have improved significantly.  She is not experiencing any shortness of breath at this time.  She continues to have residual fatigue but she was able to return to work yesterday.  She missed her dose of Humira and methotrexate last week and was advised to hold both doses this week.  We discussed that ideally she should hold both medication 2 to 3 weeks after her symptoms have completely resolved.  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   Discoid lupus - History of positive ANA-No recurrence of discoid lesions in several years.  She has no clinical features of systemic lupus.   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  3-4 flares in 2021 requiring prednisolone acetate taper according to the patient.  She has not had any recent flares.  She is not experiencing any conjunctival injection, photophobia, or eye pain at this time.  She will continue on the current treatment regimen.  Primary osteoarthritis of both hands: She has mild PIP DIP thickening consistent with osteoarthritis of both hands.  No joint tenderness or inflammation was noted.  She was able to make a complete fist bilaterally.  Joint protection and muscle strengthening were discussed.  Primary osteoarthritis of both knees: She has good range of motion of both knee joints on exam.  No warmth or effusion was noted.  Primary osteoarthritis of both feet: She is not experiencing any discomfort in her feet at this time.  She is good range of motion of both ankle joints with no tenderness or joint swelling.  Positive PPD, treated - Treated with INH in the past.  Chest x-ray obtained on 05/17/2020 did not reveal any active cardiopulmonary disease.  Smoker  Orders: Orders Placed This Encounter  Procedures  . COMPLETE METABOLIC PANEL WITH GFR  . CBC with Differential/Platelet   No orders of the defined types were placed in  this encounter.    Follow-Up Instructions: Return in about 3 months (around 10/18/2020) for Rheumatoid arthritis, Osteoarthritis.   Ofilia Neas, PA-C  Note - This record has been created using Dragon software.  Chart creation errors have been sought, but may not always  have been located. Such creation errors do not reflect on  the standard of medical care.

## 2020-07-08 ENCOUNTER — Telehealth: Payer: Self-pay | Admitting: Rheumatology

## 2020-07-08 NOTE — Telephone Encounter (Signed)
I received a phone call from [place] at 10:53 AM on July 08, 2020.  She said that her son was diagnosed with COVID 19 virus infection.  She had been exposed to her son.  She wanted to know what precautions to take as she is on immunosuppressive therapy.  I advised her to quarantine her son in a separate area.  They both showed wear surgical mask at home.  In case she develops COVID-19 infection symptoms she should notify us so that she can be referred for monoclonal antibody infusion. Bo Merino, MD

## 2020-07-10 ENCOUNTER — Telehealth: Payer: Self-pay

## 2020-07-10 ENCOUNTER — Other Ambulatory Visit: Payer: Self-pay | Admitting: *Deleted

## 2020-07-10 DIAGNOSIS — L930 Discoid lupus erythematosus: Secondary | ICD-10-CM

## 2020-07-10 DIAGNOSIS — M0630 Rheumatoid nodule, unspecified site: Secondary | ICD-10-CM

## 2020-07-10 DIAGNOSIS — F17200 Nicotine dependence, unspecified, uncomplicated: Secondary | ICD-10-CM

## 2020-07-10 DIAGNOSIS — M0579 Rheumatoid arthritis with rheumatoid factor of multiple sites without organ or systems involvement: Secondary | ICD-10-CM

## 2020-07-10 DIAGNOSIS — R7611 Nonspecific reaction to tuberculin skin test without active tuberculosis: Secondary | ICD-10-CM

## 2020-07-10 DIAGNOSIS — Z20822 Contact with and (suspected) exposure to covid-19: Secondary | ICD-10-CM | POA: Diagnosis not present

## 2020-07-10 DIAGNOSIS — Z79899 Other long term (current) drug therapy: Secondary | ICD-10-CM

## 2020-07-10 NOTE — Telephone Encounter (Signed)
I called patient, patient is positive, urgent infusion order placed - patient made aware it could take 24-48 hours for response, Dr. Estanislado Pandy advised patient to call PCP for possible medication, patient verbalized understanding.

## 2020-07-10 NOTE — Progress Notes (Unsigned)
For post Covid exposure:  Date of Symptom Onset: 07/08/2020 Medical Conditions: RA, Lupus, High risk medication use, Smoker Is patient receiving IV therapy for chronic conditions?  no Has patient had an organ transplant or bone marrow transplant in the last 6 month? no Vaccination Status: Vaccinated Name of Vaccine Received: Moderna Number of doses:3   Patient advised to hold *** for 3 weeks after Covid symptoms have resolved.   Patient advised it may take up to 48 hours for the infusion center to contact them if (he/she) qualifies. Patient advised if symptoms worsen to go to the emergency room for evaluation.

## 2020-07-10 NOTE — Telephone Encounter (Signed)
I called patient, patient is positive, urgent infusion order placed - patient made aware it could take 24-48 hours for response, Dr. Deveshwar advised patient to call PCP for possible medication, patient verbalized understanding.  

## 2020-07-10 NOTE — Telephone Encounter (Signed)
Patient called to let Dr. Estanislado Pandy know that she tested positive for Covid yesterday.  Patient states Dr. Estanislado Pandy wanted her to notify the office so she could be referred for the antibody infusion.

## 2020-07-11 ENCOUNTER — Telehealth: Payer: Self-pay

## 2020-07-11 ENCOUNTER — Telehealth: Payer: Self-pay | Admitting: Unknown Physician Specialty

## 2020-07-11 ENCOUNTER — Other Ambulatory Visit (HOSPITAL_COMMUNITY): Payer: Self-pay

## 2020-07-11 MED ORDER — NIRMATRELVIR/RITONAVIR (PAXLOVID)TABLET
3.0000 | ORAL_TABLET | Freq: Two times a day (BID) | ORAL | 0 refills | Status: AC
  Filled 2020-07-11: qty 30, 5d supply, fill #0

## 2020-07-11 NOTE — Telephone Encounter (Signed)
Outpatient Oral COVID Treatment Note  I connected with Samantha Hughes on 07/11/2020/4:26 PM by telephone and verified that I am speaking with the correct person using two identifiers.  I discussed the limitations, risks, security, and privacy concerns of performing an evaluation and management service by telephone and the availability of in person appointments. I also discussed with the patient that there may be a patient responsible charge related to this service. The patient expressed understanding and agreed to proceed.  Patient location: home Provider location: home  Diagnosis: COVID-19 infection  Purpose of visit: Discussion of potential use of Molnupiravir or Paxlovid, a new treatment for mild to moderate COVID-19 viral infection in non-hospitalized patients.   Subjective: Patient is a 60 y.o. female who has been diagnosed with COVID 19 viral infection.  Their symptoms began on 4/30 with headache, stuffiness.    Past Medical History:  Diagnosis Date  . Depression   . Osteoarthritis   . Rheumatoid arthritis ([facility])     Allergies  Allergen Reactions  . Arava [Leflunomide] Other (See Comments)    Urinary incontinence   . Plaquenil [Hydroxychloroquine Sulfate] Other (See Comments)    leukopenia     Current Outpatient Medications:  .  Adalimumab (HUMIRA PEN) 40 MG/0.4ML PNKT, Inject 40 mg into the skin every 14 (fourteen) days., Disp: 6 each, Rfl: 0 .  estradiol (ESTRACE) 1 MG tablet, Take 1 mg by mouth daily., Disp: , Rfl:  .  folic acid (FOLVITE) 1 MG tablet, Take 2 tablets (2 mg total) by mouth daily., Disp: 180 tablet, Rfl: 3 .  medroxyPROGESTERone (PROVERA) 2.5 MG tablet, Take 2.5 mg by mouth daily., Disp: , Rfl:  .  Methotrexate Sodium (METHOTREXATE, PF,) 50 MG/2ML injection, INJECT 0.8ML INTO THE SKIN ONCE WEEKLY.(SINGLE USE VIAL, DISCARD AFTER SINGLE USE-NO PRESERVATIVE), Disp: 8 mL, Rfl: 2 .  Multiple Vitamin (MULTIVITAMIN WITH MINERALS) TABS tablet, Take 1 tablet by mouth  daily., Disp: , Rfl:  .  predniSONE (DELTASONE) 5 MG tablet, Take 4 tablets by mouth daily x4 days, 3 tablets by mouth daily x4 days, 2 tablets by mouth daily x4 days, 1 tablet by mouth daily x4 days., Disp: 40 tablet, Rfl: 0 .  TUBERCULIN SYR 1CC/27GX1/2" 27G X 1/2" 1 ML MISC, Use 1 syringe once weekly to inject methotrexate., Disp: 12 each, Rfl: 2 .  venlafaxine XR (EFFEXOR-XR) 75 MG 24 hr capsule, Take 75 mg by mouth daily., Disp: , Rfl:   Objective: Patient appears/sounds fatigued.  They are in no apparent distress.  Breathing is non labored.  Mood and behavior are normal.  Laboratory Data:  Recent Results (from the past 2160 hour(s))  ANA     Status: Abnormal   Collection Time: 05/17/20  3:32 PM  Result Value Ref Range   Anti Nuclear Antibody (ANA) POSITIVE (A) NEGATIVE    Comment: ANA IFA is a first line screen for detecting the presence of up to approximately 150 autoantibodies in various autoimmune diseases. A positive ANA IFA result is suggestive of autoimmune disease and reflexes to titer and pattern. Further laboratory testing may be considered if clinically indicated. . For additional information, please refer to http://education.[facility].com/faq/FAQ177 (This link is being provided for informational/ educational purposes only.) .   C3 and C4     Status: None   Collection Time: 05/17/20  3:32 PM  Result Value Ref Range   C3 Complement 128 83 - 193 mg/dL   C4 Complement 31 15 - 57 mg/dL  Anti-DNA antibody, double-stranded  Status: None   Collection Time: 05/17/20  3:32 PM  Result Value Ref Range   ds DNA Ab <1 IU/mL    Comment:                            IU/mL       Interpretation                            < or = 4    Negative                            5-9         Indeterminate                            > or = 10   Positive .   Sedimentation rate     Status: None   Collection Time: 05/17/20  3:32 PM  Result Value Ref Range   Sed Rate 22 0 - 30 mm/h   RNP Antibody     Status: None   Collection Time: 05/17/20  3:32 PM  Result Value Ref Range   Ribonucleic Protein(ENA) Antibody, IgG <1.0 NEG <1.0 NEG AI  Anti-scleroderma antibody     Status: None   Collection Time: 05/17/20  3:32 PM  Result Value Ref Range   Scleroderma (Scl-70) (ENA) Antibody, IgG <1.0 NEG <1.0 NEG AI  Anti-Smith antibody     Status: None   Collection Time: 05/17/20  3:32 PM  Result Value Ref Range   ENA SM Ab Ser-aCnc <1.0 NEG <1.0 NEG AI  Sjogrens syndrome-A extractable nuclear antibody     Status: None   Collection Time: 05/17/20  3:32 PM  Result Value Ref Range   SSA ([place]) (ENA) Antibody, IgG <1.0 NEG <1.0 NEG AI  Sjogrens syndrome-B extractable nuclear antibody     Status: None   Collection Time: 05/17/20  3:32 PM  Result Value Ref Range   SSB ([place]) (ENA) Antibody, IgG <1.0 NEG <1.0 NEG AI  COMPLETE METABOLIC PANEL WITH GFR     Status: None   Collection Time: 05/17/20  3:32 PM  Result Value Ref Range   Glucose, Bld 78 65 - 99 mg/dL    Comment: .            Fasting reference interval .    BUN 22 7 - 25 mg/dL   Creat 7.93 9.03 - 0.09 mg/dL    Comment: For patients >47 years of age, the reference limit for Creatinine is approximately 13% higher for people identified as African-American. .    GFR, Est Non African American 88 > OR = 60 mL/min/1.49m2   GFR, Est African American 102 > OR = 60 mL/min/1.20m2   BUN/Creatinine Ratio NOT APPLICABLE 6 - 22 (calc)   Sodium 143 135 - 146 mmol/L   Potassium 4.2 3.5 - 5.3 mmol/L   Chloride 107 98 - 110 mmol/L   CO2 26 20 - 32 mmol/L   Calcium 9.8 8.6 - 10.4 mg/dL   Total Protein 6.6 6.1 - 8.1 g/dL   Albumin 4.0 3.6 - 5.1 g/dL   Globulin 2.6 1.9 - 3.7 g/dL (calc)   AG Ratio 1.5 1.0 - 2.5 (calc)   Total Bilirubin 0.3 0.2 - 1.2 mg/dL   Alkaline phosphatase (APISO) 51 37 - 153  U/L   AST 19 10 - 35 U/L   ALT 9 6 - 29 U/L  CBC with Differential/Platelet     Status: Abnormal   Collection Time: 05/17/20  3:32 PM   Result Value Ref Range   WBC 5.0 3.8 - 10.8 Thousand/uL   RBC 3.71 (L) 3.80 - 5.10 Million/uL   Hemoglobin 11.6 (L) 11.7 - 15.5 g/dL   HCT 34.6 (L) 35.0 - 45.0 %   MCV 93.3 80.0 - 100.0 fL   MCH 31.3 27.0 - 33.0 pg   MCHC 33.5 32.0 - 36.0 g/dL   RDW 12.6 11.0 - 15.0 %   Platelets 243 140 - 400 Thousand/uL   MPV 11.0 7.5 - 12.5 fL   Neutro Abs 2,430 1,500 - 7,800 cells/uL   Lymphs Abs 1,925 850 - 3,900 cells/uL   Absolute Monocytes 355 200 - 950 cells/uL   Eosinophils Absolute 260 15 - 500 cells/uL   Basophils Absolute 30 0 - 200 cells/uL   Neutrophils Relative % 48.6 %   Total Lymphocyte 38.5 %   Monocytes Relative 7.1 %   Eosinophils Relative 5.2 %   Basophils Relative 0.6 %  Anti-nuclear ab-titer (ANA titer)     Status: Abnormal   Collection Time: 05/17/20  3:32 PM  Result Value Ref Range   ANA Titer 1 1:80 (H) titer    Comment: A low level ANA titer may be present in pre-clinical autoimmune diseases and normal individuals.                 Reference Range                 <1:40        Negative                 1:40-1:80    Low Antibody Level                 >1:80        Elevated Antibody Level .    ANA Pattern 1 Nuclear, Homogeneous (A)     Comment: Homogeneous pattern is associated with systemic lupus erythematosus (SLE), drug-induced lupus and juvenile idiopathic arthritis. . AC-1: Homogeneous . International Consensus on ANA Patterns (https://www.hernandez-brewer.com/)   COMPLETE METABOLIC PANEL WITH GFR     Status: None   Collection Time: 06/19/20  4:18 PM  Result Value Ref Range   Glucose, Bld 87 65 - 99 mg/dL    Comment: .            Fasting reference interval .    BUN 20 7 - 25 mg/dL   Creat 0.86 0.50 - 1.05 mg/dL    Comment: For patients >17 years of age, the reference limit for Creatinine is approximately 13% higher for people identified as African-American. .    GFR, Est Non African American 74 > OR = 60 mL/min/1.57m2   GFR, Est African  American 86 > OR = 60 mL/min/1.70m2   BUN/Creatinine Ratio NOT APPLICABLE 6 - 22 (calc)   Sodium 140 135 - 146 mmol/L   Potassium 3.7 3.5 - 5.3 mmol/L   Chloride 108 98 - 110 mmol/L   CO2 22 20 - 32 mmol/L   Calcium 9.5 8.6 - 10.4 mg/dL   Total Protein 6.6 6.1 - 8.1 g/dL   Albumin 4.2 3.6 - 5.1 g/dL   Globulin 2.4 1.9 - 3.7 g/dL (calc)   AG Ratio 1.8 1.0 - 2.5 (calc)   Total Bilirubin 0.3 0.2 -  1.2 mg/dL   Alkaline phosphatase (APISO) 51 37 - 153 U/L   AST 21 10 - 35 U/L   ALT 10 6 - 29 U/L  CBC with Differential/Platelet     Status: None   Collection Time: 06/19/20  4:18 PM  Result Value Ref Range   WBC 5.7 3.8 - 10.8 Thousand/uL   RBC 3.82 3.80 - 5.10 Million/uL   Hemoglobin 11.8 11.7 - 15.5 g/dL   HCT 36.5 35.0 - 45.0 %   MCV 95.5 80.0 - 100.0 fL   MCH 30.9 27.0 - 33.0 pg   MCHC 32.3 32.0 - 36.0 g/dL   RDW 13.4 11.0 - 15.0 %   Platelets 244 140 - 400 Thousand/uL   MPV 11.4 7.5 - 12.5 fL   Neutro Abs 2,331 1,500 - 7,800 cells/uL   Lymphs Abs 2,759 850 - 3,900 cells/uL   Absolute Monocytes 319 200 - 950 cells/uL   Eosinophils Absolute 251 15 - 500 cells/uL   Basophils Absolute 40 0 - 200 cells/uL   Neutrophils Relative % 40.9 %   Total Lymphocyte 48.4 %   Monocytes Relative 5.6 %   Eosinophils Relative 4.4 %   Basophils Relative 0.7 %     Assessment: 60 y.o. female with mild/moderate COVID 19 viral infection diagnosed on 4/30 at high risk for progression to severe COVID 19.  Plan:  This patient is a 60 y.o. female that meets the following criteria for Emergency Use Authorization of: Paxlovid 1. Age >12 yr AND > 40 kg 2. SARS-COV-2 positive test 3. Symptom onset < 5 days 4. Mild-to-moderate COVID disease with high risk for severe progression to hospitalization or death  I have spoken and communicated the following to the patient or parent/caregiver regarding: 1. Paxlovid is an unapproved drug that is authorized for use under an Emergency Use Authorization.  2. There  are no adequate, approved, available products for the treatment of COVID-19 in adults who have mild-to-moderate COVID-19 and are at high risk for progressing to severe COVID-19, including hospitalization or death. 3. Other therapeutics are currently authorized. For additional information on all products authorized for treatment or prevention of COVID-19, please see [employer].  4. There are benefits and risks of taking this treatment as outlined in the "Fact Sheet for Patients and Caregivers."  5. "Fact Sheet for Patients and Caregivers" was reviewed with patient. A hard copy will be provided to patient from pharmacy prior to the patient receiving treatment. 6. Patients should continue to self-isolate and use infection control measures (e.g., wear mask, isolate, social distance, avoid sharing personal items, clean and disinfect "high touch" surfaces, and frequent handwashing) according to CDC guidelines.  7. The patient or parent/caregiver has the option to accept or refuse treatment. 8. Patient medication history was reviewed for potential drug interactions:No drug interactions 9. Patient's GFR was calculated to be 74, and they were therefore prescribed Normal dose (GFR>60) - nirmatrelvir 150mg  tab (2 tablet) by mouth twice daily AND ritonavir 100mg  tab (1 tablet) by mouth twice daily   After reviewing above information with the patient, the patient agrees to receive Paxlovid.  Follow up instructions:    . Take prescription BID x 5 days as directed . Reach out to pharmacist for counseling on medication if desired . For concerns regarding further COVID symptoms please follow up with your PCP or urgent care . For urgent or life-threatening issues, seek care at your local emergency department  The patient was provided an opportunity to ask questions, and  all were answered. The patient  agreed with the plan and demonstrated an understanding of the instructions.   Script sent to [facility] and opted to pick up RX.  The patient was advised to call their PCP or seek an in-person evaluation if the symptoms worsen or if the condition fails to improve as anticipated.   I provided 15 minutes of non face-to-face telephone visit time during this encounter, and > 50% was spent counseling as documented under my assessment & plan.  Kathrine Haddock, NP 07/11/2020 /4:26 PM

## 2020-07-11 NOTE — Telephone Encounter (Signed)
Called to discuss with patient about COVID-19 symptoms and the use of one of the available treatments for those with mild to moderate Covid symptoms and at a high risk of hospitalization.  Pt appears to qualify for outpatient treatment due to co-morbid conditions and/or a member of an at-risk group in accordance with the FDA Emergency Use Authorization.    Symptom onset: 07/08/20 Fatigue,congestion,sweats Vaccinated: Yes Booster? Yes Immunocompromised? Yes Qualifiers: Rheumatoid arthritis NIH Criteria:   Pt. Would like to speak with APP.   Marcello Moores

## 2020-07-18 ENCOUNTER — Other Ambulatory Visit: Payer: Self-pay

## 2020-07-18 ENCOUNTER — Ambulatory Visit (INDEPENDENT_AMBULATORY_CARE_PROVIDER_SITE_OTHER): Payer: BC Managed Care – PPO | Admitting: Physician Assistant

## 2020-07-18 ENCOUNTER — Encounter: Payer: Self-pay | Admitting: Physician Assistant

## 2020-07-18 VITALS — BP 126/83 | HR 82 | Resp 14 | Ht 65.5 in | Wt 167.8 lb

## 2020-07-18 DIAGNOSIS — Z79899 Other long term (current) drug therapy: Secondary | ICD-10-CM | POA: Diagnosis not present

## 2020-07-18 DIAGNOSIS — M0579 Rheumatoid arthritis with rheumatoid factor of multiple sites without organ or systems involvement: Secondary | ICD-10-CM

## 2020-07-18 DIAGNOSIS — M170 Bilateral primary osteoarthritis of knee: Secondary | ICD-10-CM

## 2020-07-18 DIAGNOSIS — F17200 Nicotine dependence, unspecified, uncomplicated: Secondary | ICD-10-CM

## 2020-07-18 DIAGNOSIS — R7611 Nonspecific reaction to tuberculin skin test without active tuberculosis: Secondary | ICD-10-CM

## 2020-07-18 DIAGNOSIS — L930 Discoid lupus erythematosus: Secondary | ICD-10-CM | POA: Diagnosis not present

## 2020-07-18 DIAGNOSIS — M19072 Primary osteoarthritis, left ankle and foot: Secondary | ICD-10-CM

## 2020-07-18 DIAGNOSIS — M0630 Rheumatoid nodule, unspecified site: Secondary | ICD-10-CM | POA: Diagnosis not present

## 2020-07-18 DIAGNOSIS — M19042 Primary osteoarthritis, left hand: Secondary | ICD-10-CM

## 2020-07-18 DIAGNOSIS — H15003 Unspecified scleritis, bilateral: Secondary | ICD-10-CM

## 2020-07-18 DIAGNOSIS — M19041 Primary osteoarthritis, right hand: Secondary | ICD-10-CM

## 2020-07-18 DIAGNOSIS — M19071 Primary osteoarthritis, right ankle and foot: Secondary | ICD-10-CM

## 2020-07-18 NOTE — Patient Instructions (Signed)
Standing Labs We placed an order today for your standing lab work.   Please have your standing labs drawn in august and every 3 months   If possible, please have your labs drawn 2 weeks prior to your appointment so that the provider can discuss your results at your appointment.  We have open lab daily Monday through Thursday from 1:30-4:30 PM and Friday from 1:30-4:00 PM at the office of Dr. Bo Merino, [facility].   Please be advised, all patients with office appointments requiring lab work will take precedents over walk-in lab work.  If possible, please come for your lab work on Monday and Friday afternoons, as you may experience shorter wait times. The office is located at [address] [place], [place], [place] [address] No appointment is necessary.   Labs are drawn by [facility]. Please bring your co-pay at the time of your lab draw.  You may receive a bill from [facility] for your lab work.  If you wish to have your labs drawn at another location, please call the office 24 hours in advance to send orders.  If you have any questions regarding directions or hours of operation,  please call [phone].   As a reminder, please drink plenty of water prior to coming for your lab work. Thanks!

## 2020-07-19 LAB — CBC WITH DIFFERENTIAL/PLATELET
Absolute Monocytes: 551 cells/uL (ref 200–950)
Basophils Absolute: 41 cells/uL (ref 0–200)
Basophils Relative: 0.6 %
Eosinophils Absolute: 388 cells/uL (ref 15–500)
Eosinophils Relative: 5.7 %
HCT: 38.2 % (ref 35.0–45.0)
Hemoglobin: 12.5 g/dL (ref 11.7–15.5)
Lymphs Abs: 2577 cells/uL (ref 850–3900)
MCH: 31.3 pg (ref 27.0–33.0)
MCHC: 32.7 g/dL (ref 32.0–36.0)
MCV: 95.7 fL (ref 80.0–100.0)
MPV: 11.2 fL (ref 7.5–12.5)
Monocytes Relative: 8.1 %
Neutro Abs: 3244 cells/uL (ref 1500–7800)
Neutrophils Relative %: 47.7 %
Platelets: 223 10*3/uL (ref 140–400)
RBC: 3.99 10*6/uL (ref 3.80–5.10)
RDW: 13.5 % (ref 11.0–15.0)
Total Lymphocyte: 37.9 %
WBC: 6.8 10*3/uL (ref 3.8–10.8)

## 2020-07-19 LAB — COMPLETE METABOLIC PANEL WITH GFR
AG Ratio: 1.4 (calc) (ref 1.0–2.5)
ALT: 10 U/L (ref 6–29)
AST: 16 U/L (ref 10–35)
Albumin: 3.9 g/dL (ref 3.6–5.1)
Alkaline phosphatase (APISO): 43 U/L (ref 37–153)
BUN: 14 mg/dL (ref 7–25)
CO2: 28 mmol/L (ref 20–32)
Calcium: 9.7 mg/dL (ref 8.6–10.4)
Chloride: 110 mmol/L (ref 98–110)
Creat: 0.78 mg/dL (ref 0.50–1.05)
GFR, Est African American: 97 mL/min/{1.73_m2} (ref >=60)
GFR, Est Non African American: 84 mL/min/{1.73_m2} (ref >=60)
Globulin: 2.8 g/dL (calc) (ref 1.9–3.7)
Glucose, Bld: 58 mg/dL — ABNORMAL LOW (ref 65–99)
Potassium: 4.3 mmol/L (ref 3.5–5.3)
Sodium: 143 mmol/L (ref 135–146)
Total Bilirubin: 0.4 mg/dL (ref 0.2–1.2)
Total Protein: 6.7 g/dL (ref 6.1–8.1)

## 2020-07-19 NOTE — Progress Notes (Signed)
Glucose is 58. Rest of CMP WNL. CBC WNL.

## 2020-07-20 ENCOUNTER — Other Ambulatory Visit (HOSPITAL_COMMUNITY): Payer: Self-pay

## 2020-08-01 ENCOUNTER — Ambulatory Visit: Payer: BC Managed Care – PPO | Admitting: Rheumatology

## 2020-09-07 DIAGNOSIS — L720 Epidermal cyst: Secondary | ICD-10-CM | POA: Diagnosis not present

## 2020-09-07 DIAGNOSIS — D3612 Benign neoplasm of peripheral nerves and autonomic nervous system, upper limb, including shoulder: Secondary | ICD-10-CM | POA: Diagnosis not present

## 2020-09-07 DIAGNOSIS — D2271 Melanocytic nevi of right lower limb, including hip: Secondary | ICD-10-CM | POA: Diagnosis not present

## 2020-09-07 DIAGNOSIS — L821 Other seborrheic keratosis: Secondary | ICD-10-CM | POA: Diagnosis not present

## 2020-09-20 ENCOUNTER — Telehealth: Payer: Self-pay | Admitting: *Deleted

## 2020-09-20 NOTE — Telephone Encounter (Signed)
Patient states she did her Humira injection on 09/18/2020. Patient state she noticed a reaction on the morning of 09/19/2020. Patient states it swollen, red and raised, and a "deep itch". Patient she used anti itch cream and hydrocortisone cream with no relief. Patient states she had a smaller reaction with her last injection. Patient states she has been on Humira for a couple of months. Please advise.

## 2020-09-20 NOTE — Telephone Encounter (Signed)
Patient advised she can try taking zyrtec 1 day prior to, day of, and day after her humira injections as well as continuing to use topical antihistamines for symptomatic relief.  Patient advised Samantha Hughes would also recommend alternating injection sites and applying ice before and after the injections. Patient advised she cannot tolerate Humira injections she will need to schedule a sooner office visit to discuss other treatment options. Patient expressed understanding.

## 2020-09-20 NOTE — Telephone Encounter (Signed)
She can try taking zyrtec 1 day prior to, day of, and day after her humira injections as well as continuing to use topical antihistamines for symptomatic relief.  I would also recommend alternating injection sites and applying ice before and after the injections.  If she cannot tolerate Humira injections she will need to schedule a sooner office visit to discuss other treatment options.

## 2020-10-03 NOTE — Progress Notes (Signed)
Office Visit Note  Patient: Samantha Hughes             Date of Birth: 19-Sep-1960           MRN: [id number]             PCP: Kelton Pillar, MD Referring: Kelton Pillar, MD Visit Date: 10/17/2020 Occupation: '@GUAROCC'$ @  Subjective:  Discuss medication options   History of Present Illness: Samantha Hughes is a 60 y.o. female with history of seropositive rheumatoid arthritis, discoid lupus, and osteoarthritis.  She is on Humira 40 mg sq injections every 14 days, methotrexate 0.8 ml sq injections once weekly, and folic acid 2 mg daily.  Patient reports that she has been experiencing injection site reactions with atraumatic injection since being diagnosed with COVID-19 in May 2022. These injection site reactions have been severe causing significant itching and redness.  She tried taking Zyrtec 3 days prior to each injection in 3 days after each injection without any improvement.  She also tried several topical agents but continued to experience intense itching.  She tried injecting both her abdomen and her thigh without any improvement.  She has not had any injection site reactions with methotrexate.  She would like to discuss other treatment options other than Humira since the reaction seem to be getting worse.  Her last injection of Humira was on 10/09/2020. Patient reports that she has found the combination of Humira and methotrexate to be effective at managing her rheumatoid arthritis.  She denies any joint swelling at this time.  She is having some discomfort in the right elbow but has been able to fully straighten her elbow without difficulty.  She denies any other joint pain or joint swelling.  She denies any recent infections.     Activities of Daily Living:  Patient reports morning stiffness for 0 minutes.   Patient Denies nocturnal pain.  Difficulty dressing/grooming: Denies Difficulty climbing stairs: Denies Difficulty getting out of chair: Denies Difficulty using hands for taps,  buttons, cutlery, and/or writing: Denies  Review of Systems  Constitutional:  Positive for fatigue.  HENT:  Negative for mouth sores, mouth dryness and nose dryness.   Eyes:  Negative for pain, itching and dryness.  Respiratory:  Negative for shortness of breath and difficulty breathing.   Cardiovascular:  Negative for chest pain and palpitations.  Gastrointestinal:  Positive for constipation. Negative for blood in stool and diarrhea.  Endocrine: Negative for increased urination.  Genitourinary:  Negative for difficulty urinating.  Musculoskeletal:  Positive for joint pain and joint pain. Negative for joint swelling, myalgias, morning stiffness, muscle tenderness and myalgias.  Skin:  Negative for color change, rash and redness.  Allergic/Immunologic: Negative for susceptible to infections.  Neurological:  Negative for dizziness, numbness, headaches, memory loss and weakness.  Hematological:  Positive for bruising/bleeding tendency.  Psychiatric/Behavioral:  Negative for confusion.    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   High risk medication use 02/06/2016   Positive PPD, treated 02/06/2016    Past Medical History:  Diagnosis Date   Depression    Osteoarthritis    Rheumatoid  arthritis ([place])     Family History  Problem Relation Age of Onset   Diabetes Mother    Hypertension Mother    Diabetes Sister    Diabetes Maternal Grandmother    Cancer Maternal Grandfather    Past Surgical History:  Procedure Laterality Date   eye lift     ingrown toenail Bilateral    bilateral  great toes   TUBAL LIGATION     Social History   Social History Narrative   Not on file   Immunization History  Administered Date(s) Administered   Moderna Sars-Covid-2 Vaccination 05/17/2019, 06/15/2019, 03/02/2020     Objective: Vital Signs: BP (!) 138/92 (BP Location: Left Arm, Patient Position: Sitting, Cuff Size: Normal)   Pulse 74   Resp 16   Ht 5' 5.5" (1.664 m)   Wt 170 lb (77.1 kg)   BMI 27.86 kg/m    Physical Exam Vitals and nursing note reviewed.  Constitutional:      Appearance: She is well-developed.  HENT:     Head: Normocephalic and atraumatic.  Eyes:     Conjunctiva/sclera: Conjunctivae normal.  Pulmonary:     Effort: Pulmonary effort is normal.  Abdominal:     Palpations: Abdomen is soft.  Musculoskeletal:     Cervical back: Normal range of motion.  Skin:    General: Skin is warm and dry.     Capillary Refill: Capillary refill takes less than 2 seconds.  Neurological:     Mental Status: She is alert and oriented to person, place, and time.  Psychiatric:        Behavior: Behavior normal.     Musculoskeletal Exam: C-spine, thoracic spine, and lumbar spine good ROM.  Shoulder joints, elbow joints, wrist joints, MCPs,PIPs, and DIPs good ROM with no synovitis. Tenderness over the lateral epicondyle of the right elbow. PIP and DIP thickening consistent with OA of both hands. Complete fist formation bilaterally.  Hip joints, knee joints, ankle joints, MTPs, PIPs, and DIPs good ROM with no synovitis.  No warmth or effusion of knee joints.  No tenderness or swelling of ankle joints.    CDAI Exam: CDAI Score: 0.2  Patient Global: 1 mm; Provider Global: 1 mm Swollen: 0 ; Tender: 0  Joint Exam 10/17/2020   No joint exam has been documented for this visit   There is currently no information documented on the homunculus. Go to the Rheumatology activity and complete the homunculus joint exam.  Investigation: No additional findings.  Imaging: No results  found.  Recent Labs: Lab Results  Component Value Date   WBC 6.8 07/18/2020   HGB 12.5 07/18/2020   PLT 223 07/18/2020   NA 143 07/18/2020   K 4.3 07/18/2020   CL 110 07/18/2020   CO2 28 07/18/2020   GLUCOSE 58 (L) 07/18/2020   BUN 14 07/18/2020   CREATININE 0.78 07/18/2020   BILITOT 0.4 07/18/2020   ALKPHOS 50 10/07/2016   AST 16 07/18/2020   ALT 10 07/18/2020   PROT 6.7 07/18/2020   ALBUMIN 4.2 10/07/2016   CALCIUM 9.7 07/18/2020   GFRAA 97 07/18/2020   QFTBGOLDPLUS POSITIVE (A) 02/12/2018    Speciality Comments: No specialty comments available.  Procedures:  No procedures performed Allergies: Arava [leflunomide] and Plaquenil [hydroxychloroquine sulfate]   Assessment / Plan:     Visit Diagnoses: Rheumatoid arthritis with rheumatoid factor of multiple sites without organ or systems involvement (HCC) - Positive RF, positive anti-CCP: She has no tenderness or synovitis on examination today.  She has not had any recent rheumatoid arthritis flares.  She is clinically been doing well on Humira 40 mg subcutaneous injections every 14 days, methotrexate 0.8 mL apiece injections once weekly, and folic acid 2 mg by mouth daily.  She has not been experiencing any increased joint pain or joint swelling.  She has not had any morning stiffness, nocturnal pain, or difficulty with ADLs.  According to the patient since she recovered from COVID-19 in May 2022 she has had an injection site reaction with the Humira injection.  She tried taking Zyrtec 3 days prior to and 3 days after each injection as well as using topical agents but continued to have significant itching, swelling, and redness at the injection site.  She brought several pictures with her today which revealed severe injection site reactions on her abdomen and upper thigh.  Her last Humira injection was on 10/09/2020.  Advised to discontinued Humira. She has not had any injection site reactions with methotrexate.  She requested to discuss  other treatment options.  Indications, contraindications, potential side effects of Enbrel were discussed today in detail.  All questions were addressed and consent was obtained today.  We will apply for Enbrel through her insurance and once approved she will initiate therapy in the office.  She will remain on methotrexate as prescribed.  She will follow-up in the office in about 8 weeks to assess her response to Enbrel.   Medication counseling:   TB Test: CXR 05/17/20  Hepatitis panel: Negative on 02/12/18 HIV: negative 02/12/18 SPEP: Unremarkable 02/12/18  Immunoglobulins: WNL on 02/12/18  Chest x-ray: 05/17/20  Does patient have diagnosis of heart failure?  No  Counseled patient that Enbrel is a TNF blocking agent.  Reviewed Enbrel dose of 50 mg once weekly.  Counseled patient on purpose, proper use, and adverse effects of Enbrel.  Reviewed the most common adverse effects including infections, headache, and injection site reactions. Discussed that there is the possibility of an increased risk of malignancy but it is not well understood if this increased risk is due to the medication or the disease state.  Advised patient to get yearly dermatology exams due to risk of skin cancer.  Reviewed the importance of regular labs while on Enbrel therapy.  Advised patient to get standing labs one month after starting Enbrel then every 2 months.  Provided patient with standing lab orders.  Counseled patient that Enbrel should be held prior to scheduled surgery.  Counseled patient to avoid live vaccines while on Enbrel.  Advised patient to get annual influenza vaccine and the pneumococcal vaccine as needed.  Provided patient with medication education material and answered all questions.  Patient voiced understanding.  Patient consented to Enbrel.  Will upload consent into the media tab.  Reviewed storage instructions for Enbrel.  Advised initial injection must be administered in office.  Patient voiced understanding.     High risk medication use -Applying for Enbrel 50 mg sq injections once weekly.  She will remain on Methotrexate 0.8 ml sq injections once weekly and folic acid 2 mg daily.  Humira: Initiated 05/23/20-last injection 10/09/20: Injection site reactions-advised to discontinue.   CBC and CMP updated on 07/18/20.  She is due to update CBC and CMP today. Orders released.  Her next lab work will be due in November and every 3 months.  Standing orders for CBC and CMP are in place.  05/17/20: CXR did not reveal active cardiopulmonary disease.   Plan: CBC with Differential/Platelet, COMPLETE METABOLIC PANEL  WITH GFR Discussed the importance of holding Enbrel and MTX if she develops signs or symptoms of an infection and to resume once the infection has completely cleared.  Discussed the importance of yearly skin exams while on Enbrel due to the increased risk for non-melanoma skin cancer.   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   Discoid lupus - History of positive ANA-No recurrence of discoid lesions in several years.  She has no clinical features of systemic lupus.   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  3-4 flares in 2021 requiring prednisolone acetate taper according to the patient. No recent flares. She will remain on MTX as prescribed.   Lateral epicondylitis of right elbow: She has tenderness to palpation over the lateral epicondyle of the right elbow.  No tenderness over the right elbow joint line.  No inflammation noted.  She was given a prescription for a brace as well as a handout of exercises to perform.  She can use voltaren gel topically as needed for pain relief.  Advised to notify us if her symptoms persist or worsen and we can place a referral to PT.  Discussed that if her symptoms persist and are severe a cortisone injection is an option in the future.   Primary osteoarthritis of both hands: She has PIP and DIP prominence consistent with osteoarthritis of both hands.  No tenderness or inflammation on  exam.  Complete fist formation bilaterally.   Primary osteoarthritis of both knees: She has good ROM of both knee joints with no discomfort.  No warmth or effusion noted.    Primary osteoarthritis of both feet: She is not experiencing any discomfort in her feet at this time.   Positive PPD, treated - Treated with INH in the past.  Chest x-ray obtained on 05/17/2020 did not reveal any active cardiopulmonary disease.  Smoker  Orders: Orders Placed This Encounter  Procedures   CBC with Differential/Platelet   COMPLETE METABOLIC PANEL WITH GFR   No orders of the defined types were placed in this encounter.   Follow-Up Instructions: Return in about 8 weeks (around 12/12/2020) for Rheumatoid arthritis, Discoid lupus, Osteoarthritis.   Ofilia Neas, PA-C  Note - This record has been created using Dragon software.  Chart creation errors have been sought, but may not always  have been located. Such creation errors do not reflect on  the standard of medical care.

## 2020-10-17 ENCOUNTER — Encounter: Payer: Self-pay | Admitting: Physician Assistant

## 2020-10-17 ENCOUNTER — Other Ambulatory Visit (HOSPITAL_COMMUNITY): Payer: Self-pay

## 2020-10-17 ENCOUNTER — Ambulatory Visit (INDEPENDENT_AMBULATORY_CARE_PROVIDER_SITE_OTHER): Payer: BC Managed Care – PPO | Admitting: Physician Assistant

## 2020-10-17 ENCOUNTER — Telehealth: Payer: Self-pay | Admitting: Pharmacist

## 2020-10-17 ENCOUNTER — Other Ambulatory Visit: Payer: Self-pay

## 2020-10-17 VITALS — BP 138/92 | HR 74 | Resp 16 | Ht 65.5 in | Wt 170.0 lb

## 2020-10-17 DIAGNOSIS — M0630 Rheumatoid nodule, unspecified site: Secondary | ICD-10-CM | POA: Diagnosis not present

## 2020-10-17 DIAGNOSIS — Z79899 Other long term (current) drug therapy: Secondary | ICD-10-CM

## 2020-10-17 DIAGNOSIS — M19042 Primary osteoarthritis, left hand: Secondary | ICD-10-CM

## 2020-10-17 DIAGNOSIS — M0579 Rheumatoid arthritis with rheumatoid factor of multiple sites without organ or systems involvement: Secondary | ICD-10-CM

## 2020-10-17 DIAGNOSIS — M7711 Lateral epicondylitis, right elbow: Secondary | ICD-10-CM

## 2020-10-17 DIAGNOSIS — H15003 Unspecified scleritis, bilateral: Secondary | ICD-10-CM

## 2020-10-17 DIAGNOSIS — L930 Discoid lupus erythematosus: Secondary | ICD-10-CM

## 2020-10-17 DIAGNOSIS — R7611 Nonspecific reaction to tuberculin skin test without active tuberculosis: Secondary | ICD-10-CM

## 2020-10-17 DIAGNOSIS — M19071 Primary osteoarthritis, right ankle and foot: Secondary | ICD-10-CM

## 2020-10-17 DIAGNOSIS — M19041 Primary osteoarthritis, right hand: Secondary | ICD-10-CM

## 2020-10-17 DIAGNOSIS — M19072 Primary osteoarthritis, left ankle and foot: Secondary | ICD-10-CM

## 2020-10-17 DIAGNOSIS — M170 Bilateral primary osteoarthritis of knee: Secondary | ICD-10-CM

## 2020-10-17 DIAGNOSIS — F17200 Nicotine dependence, unspecified, uncomplicated: Secondary | ICD-10-CM

## 2020-10-17 LAB — CBC WITH DIFFERENTIAL/PLATELET
Absolute Monocytes: 347 cells/uL (ref 200–950)
Basophils Absolute: 61 cells/uL (ref 0–200)
Basophils Relative: 1.2 %
Eosinophils Absolute: 459 cells/uL (ref 15–500)
Eosinophils Relative: 9 %
HCT: 37.3 % (ref 35.0–45.0)
Hemoglobin: 11.8 g/dL (ref 11.7–15.5)
Lymphs Abs: 2305 cells/uL (ref 850–3900)
MCH: 30.3 pg (ref 27.0–33.0)
MCHC: 31.6 g/dL — ABNORMAL LOW (ref 32.0–36.0)
MCV: 95.6 fL (ref 80.0–100.0)
MPV: 10.8 fL (ref 7.5–12.5)
Monocytes Relative: 6.8 %
Neutro Abs: 1928 cells/uL (ref 1500–7800)
Neutrophils Relative %: 37.8 %
Platelets: 220 10*3/uL (ref 140–400)
RBC: 3.9 10*6/uL (ref 3.80–5.10)
RDW: 13.5 % (ref 11.0–15.0)
Total Lymphocyte: 45.2 %
WBC: 5.1 10*3/uL (ref 3.8–10.8)

## 2020-10-17 LAB — COMPLETE METABOLIC PANEL WITH GFR
AG Ratio: 1.6 (calc) (ref 1.0–2.5)
ALT: 12 U/L (ref 6–29)
AST: 18 U/L (ref 10–35)
Albumin: 4.1 g/dL (ref 3.6–5.1)
Alkaline phosphatase (APISO): 45 U/L (ref 37–153)
BUN: 16 mg/dL (ref 7–25)
CO2: 25 mmol/L (ref 20–32)
Calcium: 9.1 mg/dL (ref 8.6–10.4)
Chloride: 109 mmol/L (ref 98–110)
Creat: 0.73 mg/dL (ref 0.50–1.03)
Globulin: 2.6 g/dL (calc) (ref 1.9–3.7)
Glucose, Bld: 66 mg/dL (ref 65–99)
Potassium: 3.9 mmol/L (ref 3.5–5.3)
Sodium: 142 mmol/L (ref 135–146)
Total Bilirubin: 0.4 mg/dL (ref 0.2–1.2)
Total Protein: 6.7 g/dL (ref 6.1–8.1)
eGFR: 95 mL/min/{1.73_m2} (ref >=60)

## 2020-10-17 NOTE — Telephone Encounter (Signed)
Received notification from [facility] regarding a prior authorization for ENBREL. Authorization has been APPROVED from 09/17/20 to 04/15/21.   Patient must fill through [place]: [phone]  Authorization # [id number]  Patient can sign up for Enbrel Copay card.

## 2020-10-17 NOTE — Telephone Encounter (Signed)
Submitted a Prior Authorization request to [employer] for ENBREL via CoverMyMeds. Will update once we receive a response.   KeyMichelene Heady - PA Case ID: [id number]

## 2020-10-17 NOTE — Progress Notes (Signed)
Pharmacy Note  Subjective: Patient presents today to the [facility] for follow up office visit.  Patient seen by the pharmacist for counseling on Enbrel for rheumatoid arthritis. She had injection site reaction to Humira.    She has pictures of injection site reaction in abdomen and thigh - which includes diffuse redness, swelling, and itchiness. States the reaction was most prominent the morning after her dose. She states she tried cetirizine and reaction resolved after about a week.  Diagnosis of heart failure: No  Objective:  CBC    Component Value Date/Time   WBC 6.8 07/18/2020 0846   RBC 3.99 07/18/2020 0846   HGB 12.5 07/18/2020 0846   HCT 38.2 07/18/2020 0846   PLT 223 07/18/2020 0846   MCV 95.7 07/18/2020 0846   MCH 31.3 07/18/2020 0846   MCHC 32.7 07/18/2020 0846   RDW 13.5 07/18/2020 0846   LYMPHSABS 2,577 07/18/2020 0846   MONOABS 300 10/07/2016 1316   EOSABS 388 07/18/2020 0846   BASOSABS 41 07/18/2020 0846     CMP     Component Value Date/Time   NA 143 07/18/2020 0846   K 4.3 07/18/2020 0846   CL 110 07/18/2020 0846   CO2 28 07/18/2020 0846   GLUCOSE 58 (L) 07/18/2020 0846   BUN 14 07/18/2020 0846   CREATININE 0.78 07/18/2020 0846   CALCIUM 9.7 07/18/2020 0846   PROT 6.7 07/18/2020 0846   ALBUMIN 4.2 10/07/2016 1316   AST 16 07/18/2020 0846   ALT 10 07/18/2020 0846   ALKPHOS 50 10/07/2016 1316   BILITOT 0.4 07/18/2020 0846   GFRNONAA 84 07/18/2020 0846   GFRAA 97 07/18/2020 0846     Baseline Immunosuppressant Therapy Labs TB GOLD Quantiferon TB Gold Latest Ref Rng & Units 02/12/2018  Quantiferon TB Gold Plus NEGATIVE POSITIVE(A)   Hepatitis Panel Hepatitis Latest Ref Rng & Units 02/12/2018  Hep B Surface Ag NON-REACTI NON-REACTIVE  Hep B IgM NON-REACTI NON-REACTIVE  Hep C Ab NON-REACTI NON-REACTIVE  Hep C Ab NON-REACTI NON-REACTIVE   HIV Lab Results  Component Value Date   HIV NON-REACTIVE 02/12/2018    Immunoglobulins Immunoglobulin Electrophoresis Latest Ref Rng & Units 02/12/2018  IgA  47 - 310 mg/dL 249  IgG 600 - 1,640 mg/dL 1,190  IgM 50 - 300 mg/dL 111   SPEP Serum Protein Electrophoresis Latest Ref Rng & Units 07/18/2020  Total Protein 6.1 - 8.1 g/dL 6.7  Albumin 3.8 - 4.8 g/dL -  Alpha-1 0.2 - 0.3 g/dL -  Alpha-2 0.5 - 0.9 g/dL -  Beta Globulin 0.4 - 0.6 g/dL -  Beta 2 0.2 - 0.5 g/dL -  Gamma Globulin 0.8 - 1.7 g/dL -    Chest x-ray: 05/19/20  Assessment/Plan:   Counseled patient that Enbrel is a TNF blocking agent.  Counseled patient on purpose, proper use, and adverse effects of Enbrel.  Reviewed the most common adverse effects including infections, headache, and injection site reactions.  Discussed that there is the possibility of an increased risk of malignancy including non-melanoma skin cancer but it is not well understood if this increased risk is due to the medication or the disease state.  Advised patient to get yearly dermatology exams due to risk of skin cancer.  Counseled patient that Enbrel should be held prior to scheduled surgery.  Counseled patient to avoid live vaccines while on Enbrel.  Recommend annual influenza, PCV 15 or PCV20 or Pneumovax 23, and Shingrix as indicated.  Reviewed the importance of regular labs while  on Enbrel therapy.  Will monitor CBC and CMP 1 month after starting and then every 3 months routinely thereafter. Will monitor TB gold annually. Standing orders placed. Provided patient with medication education material and answered all questions.  Patient consented to Enbrel.  Will upload consent into the media tab.  Reviewed storage instructions for Enbrel.  Advised initial injection must be administered in office.    She will continue on injectable methotrexate as prescribed. She is due for Humira on 10/23/20 and will plan to hold in anticipation of starting Enbrel  She will bring Humira to new start visit for donation to [facility].    Dermatology referral already placed on 05/23/20

## 2020-10-17 NOTE — Patient Instructions (Addendum)
Etanercept Injection What is this medication? ETANERCEPT (et a [employer]) is used for the treatment of rheumatoid arthritis. The medicine is also used to treat psoriatic arthritis, ankylosing spondylitis,and psoriasis. This medicine may be used for other purposes; ask your health care provider orpharmacist if you have questions. COMMON BRAND NAME(S): Enbrel What should I tell my care team before I take this medication? They need to know if you have any of these conditions: bleeding disorder cancer diabetes granulomatosis with polyangiitis heart failure HIV or AIDs immune system problems infection such as tuberculosis (TB) or other bacterial, fungal or viral infections liver disease nervous system problems such as Guillain-Barre syndrome, multiple sclerosis or seizures recent or upcoming vaccine an unusual or allergic reaction to etanercept, other medicines, latex, rubber, food, dyes, or preservatives pregnant or trying to get pregnant breast-feeding How should I use this medication? The medicine is injected under the skin. You will be taught how to prepare and give it. Take it as directed on the prescription label. Keep taking it unlessyour health care provider tells you stop. This medicine comes with INSTRUCTIONS FOR USE. Ask your pharmacist for directions on how to use this medicine. Read the information carefully. Talk toyour pharmacist or health care provider if you have questions. If you use a pen, be sure to take off the outer needle cover before using thedose. It is important that you put your used needles and syringes in a special sharps container. Do not put them in a trash can. If you do not have a sharpscontainer, call your pharmacist or health care provider to get one. A special MedGuide will be given to you by the pharmacist with eachprescription and refill. Be sure to read this information carefully each time. Talk to your health care provider about the use of this medicine in  children. While it may be prescribed for children as young as 73 years of age for selectedconditions, precautions do apply. Overdosage: If you think you have taken too much of this medicine contact apoison control center or emergency room at once. NOTE: This medicine is only for you. Do not share this medicine with others. What if I miss a dose? If you miss a dose, take it as soon as you can. If it is almost time for yournext dose, take only that dose. Do not take double or extra doses. What may interact with this medication? Do not take this medicine with any of the following medications: biologic medicines such as adalimumab, certolizumab, golimumab, infliximab live vaccines rilonacept This medicine may also interact with the following medications: abatacept anakinra biologic medicines such as anifrolumab, baricitinib, belimumab, canakinumab, natalizumab, rituximab, sarilumab, tocilizumab, tofacitinib, upadacitinib, vedolizumab cyclophosphamide sulfasalazine This list may not describe all possible interactions. Give your health care provider a list of all the medicines, herbs, non-prescription drugs, or dietary supplements you use. Also tell them if you smoke, drink alcohol, or use illegaldrugs. Some items may interact with your medicine. What should I watch for while using this medication? Visit your health care provider for regular checks on your progress. Tell your health care provider if your symptoms do not start to get better or if they getworse. This medicine may increase your risk of getting an infection. Call your health care provider for advice if you get a fever, chills, sore throat, or other symptoms of a cold or flu. Do not treat yourself. Try to avoid being around people who are sick. If you have not had the measles or chickenpox vaccines, tell  your health care provider right away if you are around someone with theseviruses. You will be tested for tuberculosis (TB) before you start  this medicine. If your doctor prescribes any medicine for TB, you should start taking the TB medicine before starting this medicine. Make sure to finish the full course ofTB medicine. Avoid taking medicines that contain aspirin, acetaminophen, ibuprofen, naproxen, or ketoprofen unless instructed by your health care provider. Thesemedicines may hide fever. Talk to your health care provider about your risk of cancer. You may be more atrisk for certain types of cancer if you take this medicine. This medicine can decrease the response to a vaccine. If you need to get vaccinated, tell your health care provider if you have received this medicine. Extra booster doses may be needed. Talk to your health care provider to see ifa different vaccination schedule is needed. What side effects may I notice from receiving this medication? Side effects that you should report to your health care provider as soon aspossible: allergic reactions (skin rash, itching or hives; swelling of the face, lips, or tongue) changes in vision dizziness heart failure (trouble breathing; fast, irregular heartbeat; sudden weight gain; swelling of the ankles, feet, hands; unusually weak or tired) infection (fever, chills, cough, sore throat, pain or trouble passing urine) light-colored stools liver injury (dark yellow or brown urine; general ill feeling or flu-like symptoms; loss of appetite, right upper belly pain; unusually weak or tired, yellowing of the eyes or skin) low red blood cell counts (trouble breathing; feeling faint; lightheaded, falls; unusually weak or tired) pain, tingling, numbness in the hands or feet red, scaly patches or raised bumps on the skin seizures unusual bruising or bleeding weakness in the arms or legs Side effects that usually do not require medical attention (report to yourhealth care provider if they continue or are bothersome): headache nausea pain, redness, or irritation at site where  injected vomiting This list may not describe all possible side effects. Call your doctor for medical advice about side effects. You may report side effects to FDA at1-800-FDA-1088. Where should I keep my medication? Keep out of the reach of children and pets. See product for storage information. Each product may have differentinstructions. Get rid of any unused medicine after the expiration date. To get rid of medicines that are no longer needed or have expired: Take the medicine to a medicine take-back program. Check with your pharmacy or law enforcement to find a location. If you cannot return the medicine, ask your pharmacist or health care provider how to get rid of this medicine safely. NOTE: This sheet is a summary. It may not cover all possible information. If you have questions about this medicine, talk to your doctor, pharmacist, orhealth care provider.  2022 Elsevier/Gold Standard (2019-12-10 12:59:29)   Standing Labs We placed an order today for your standing lab work.   Please have your standing labs drawn in 1 month and every 3 months   If possible, please have your labs drawn 2 weeks prior to your appointment so that the provider can discuss your results at your appointment.  Please note that you may see your imaging and lab results in [facility] before we have reviewed them. We may be awaiting multiple results to interpret others before contacting you. Please allow our office up to 72 hours to thoroughly review all of the results before contacting the office for clarification of your results.  We have open lab daily: Monday through Thursday from 1:30-4:30 PM and Friday  from 1:30-4:00 PM at the office of Dr. Bo Merino, [facility].   Please be advised, all patients with office appointments requiring lab work will take precedent over walk-in lab work.  If possible, please come for your lab work on Monday and Friday afternoons, as you may experience shorter  wait times. The office is located at [address] [place], [place], [place] [address] No appointment is necessary.   Labs are drawn by [facility]. Please bring your co-pay at the time of your lab draw.  You may receive a bill from [facility] for your lab work.  If you wish to have your labs drawn at another location, please call the office 24 hours in advance to send orders.  If you have any questions regarding directions or hours of operation,  please call [phone].   As a reminder, please drink plenty of water prior to coming for your lab work. Thanks!   Tennis Elbow  Tennis elbow is irritation and swelling (inflammation) in your outer forearm, near your elbow. Swelling affects the tissues that connect muscle to bone (tendons). Tennis elbow can happen playing any sport or doing any job where you useyour elbow too much. It is caused by doing the same motion over and over. What are the causes? This condition is often caused by playing sports or doing work where you need to keep moving your forearm the same way. Sometimes, it may be caused by asudden injury. What increases the risk? You are more likely to get tennis elbow if you play tennis or another racket sport. You also have a higher risk if you often use your hands for work. This includes: People who use computers. Architect workers. People who work in a factory. Musicians. Cooks. Cashiers. What are the signs or symptoms? Pain and tenderness in your forearm and the outer part of your elbow. You may have pain all the time or only when you use your arm. A burning feeling. This starts in your elbow and spreads down your arm. A weak grip in your hand. How is this treated? Resting and icing your arm is often the first treatment. Your doctor may also recommend: Medicines to reduce pain and swelling. An elbow strap. Physical therapy. This may include massage or exercises or both. An elbow brace. If these do not help your symptoms  get better, your doctor may recommendsurgery. Follow these instructions at home: If you have a brace or strap: Wear the brace or strap as told by your doctor. Take it off only as told by your doctor. Check the skin around the brace or strap every day. Tell your doctor if you see problems. Loosen it if your fingers: Tingle. Become numb. Turn cold and blue. Keep the brace or strap clean. If the brace or strap is not waterproof: Do not let it get wet. Cover it with a watertight covering when you take a bath or a shower. Managing pain, stiffness, and swelling  If told, put ice on the injured area. To do this: If you have a removable brace or strap, take it off as told by your doctor. Put ice in a plastic bag. Place a towel between your skin and the bag. Leave the ice on for 20 minutes, 2-3 times a day. Take off the ice if your skin turns bright red. This is very important. If you cannot feel pain, heat, or cold, you have a greater risk of damage to the area. Move your fingers often.  Activity Rest  your elbow and wrist. Avoid activities that can cause elbow problems as told by your doctor. Do exercises as told by your doctor. If you lift an object, lift it with your palm facing up. Lifestyle If your tennis elbow is caused by sports, check your equipment and make sure that: You are using it the right way. It fits you well. If your tennis elbow is caused by work or computer use, take breaks often to stretch your arm. Talk with your manager about how you can make your condition better at work. General instructions Take over-the-counter and prescription medicines only as told by your doctor. Do not smoke or use any products that contain nicotine or tobacco. If you need help quitting, ask your doctor. Keep all follow-up visits. How is this prevented? Before and after being active: Warm up and stretch before being active. Cool down and stretch after being active. Give your body time to  rest between activities. While being active: Make sure to use equipment that fits you. If you play tennis, put power in your stroke with your lower body. Avoid using your arm only. Maintain physical fitness. This includes: Strength. Flexibility. Endurance. Do exercises to strengthen the forearm muscles. Contact a doctor if: Your pain does not get better with treatment. Your pain gets worse. You have weakness in your forearm, hand, or fingers. You cannot feel your forearm, hand, or fingers. Get help right away if: Your pain is very bad. You cannot move your wrist. Summary Tennis elbow is irritation and swelling (inflammation) in your outer forearm, near your elbow. Tennis elbow is caused by doing the same motion over and over. Rest your elbow and wrist. Avoid activities as told by your doctor. If told, put ice on the injured area for 20 minutes, 2-3 times a day. This information is not intended to replace advice given to you by your health care provider. Make sure you discuss any questions you have with your healthcare provider. Document Revised: 09/07/2019 Document Reviewed: 09/07/2019 Elsevier Patient Education  [place] Ask your health care provider which exercises are safe for you. Do exercises exactly as told by your health care provider and adjust them as directed. It is normal to feel mild stretching, pulling, tightness, or discomfort as you do these exercises. Stop right away if you feel sudden pain or your pain gets worse. Do not begin these exercises until told by your health care provider. Stretching and range-of-motion exercises These exercises warm up your muscles and joints and improve the movement andflexibility of your elbow. Wrist flexion, assisted  Straighten your left / right elbow in front of you with your palm facing down toward the floor. If told by your health care provider, bend your left / right elbow to a 90-degree angle  (right angle) at your side instead of holding it straight. With your other hand, gently push over the back of your left / right hand so your fingers point toward the floor (flexion). Stop when you feel a gentle stretch on the back of your forearm. Hold this position for __________ seconds. Repeat __________ times. Complete this exercise __________ times a day. Wrist extension, assisted  Straighten your left / right elbow in front of you with your palm facing up toward the ceiling. If told by your health care provider, bend your left / right elbow to a 90-degree angle (right angle) at your side instead of holding it straight. With your other hand, gently pull your  left / right hand and fingers toward the floor (extension). Stop when you feel a gentle stretch on the palm side of your forearm. Hold this position for __________ seconds. Repeat __________ times. Complete this exercise __________ times a day. Assisted forearm rotation, supination Sit or stand with your elbows at your side. Bend your left / right elbow to a 90-degree angle (right angle). Using your uninjured hand, turn your left / right palm up toward the ceiling (supination) until you feel a gentle stretch along the inside of your forearm. Hold this position for __________ seconds. Repeat __________ times. Complete this exercise __________ times a day. Assisted forearm rotation, pronation Sit or stand with your elbows at your side. Bend your left / right elbow to a 90-degree angle (right angle). Using your uninjured hand, turn your left / right palm down toward the floor (pronation) until you feel a gentle stretch along the outside of your forearm. Hold this position for __________ seconds. Repeat __________ times. Complete this exercise __________ times a day. Strengthening exercises These exercises build strength and endurance in your forearm and elbow. Endurance is the ability to use your muscles for a long time, even after  theyget tired. Radial deviation  Stand with a __________ weight or a hammer in your left / right hand. Or, sit while holding a rubber exercise band or tubing, with your left / right forearm supported on a table or countertop. Position your forearm so that the thumb is facing the ceiling, as if you are going to clap your hands. This is the neutral position. Raise your hand upward in front of you so your thumb moves toward the ceiling (radial deviation), or pull up on the rubber tubing. Keep your forearm and elbow still while you move your wrist only. Hold this position for __________ seconds. Slowly return to the starting position. Repeat __________ times. Complete this exercise __________ times a day. Wrist extension, eccentric Sit with your left / right forearm palm-down and supported on a table or other surface. Let your left / right wrist extend over the edge of the surface. Hold a __________ weight or a piece of exercise band or tubing in your left / right hand. If using a rubber exercise band or tubing, hold the other end of the tubing with your other hand. Use your uninjured hand to move your left / right hand up toward the ceiling. Take your uninjured hand away and slowly return to the starting position using only your left / right hand. Lowering your arm under tension is called eccentric extension. Repeat __________ times. Complete this exercise __________ times a day. Wrist extension Do not do this exercise if it causes pain at the outside of your elbow. Only do this exercise once instructed by your health care provider. Sit with your left / right forearm supported on a table or other surface and your palm turned down toward the floor. Let your left / right wrist extend over the edge of the surface. Hold a __________ weight or a piece of rubber exercise band or tubing. If you are using a rubber exercise band or tubing, hold the band or tubing in place with your other hand to provide  resistance. Slowly bend your wrist so your hand moves up toward the ceiling (extension). Move only your wrist, keeping your forearm and elbow still. Hold this position for __________ seconds. Slowly return to the starting position. Repeat __________ times. Complete this exercise __________ times a day. Forearm rotation, supination To  do this exercise, you will need a lightweight hammer or rubber mallet. Sit with your left / right forearm supported on a table or other surface. Bend your elbow to a 90-degree angle (right angle). Position your forearm so that your palm is facing down toward the floor, with your hand resting over the edge of the table. Hold a hammer in your left / right hand. To make this exercise easier, hold the hammer near the head of the hammer. To make this exercise harder, hold the hammer near the end of the handle. Without moving your wrist or elbow, slowly rotate your forearm so your palm faces up toward the ceiling (supination). Hold this position for __________ seconds. Slowly return to the starting position. Repeat __________ times. Complete this exercise __________ times a day. Shoulder blade squeeze Sit in a stable chair or stand with good posture. If you are sitting down, do not let your back touch the back of the chair. Your arms should be at your sides with your elbows bent to a 90-degree angle (right angle). Position your forearms so that your thumbs are facing the ceiling (neutral position). Without lifting your shoulders up, squeeze your shoulder blades tightly together. Hold this position for __________ seconds. Slowly release and return to the starting position. Repeat __________ times. Complete this exercise __________ times a day. This information is not intended to replace advice given to you by your health care provider. Make sure you discuss any questions you have with your healthcare provider. Document Revised: 05/19/2019 Document Reviewed:  05/19/2019 Elsevier Patient Education  [place].

## 2020-10-17 NOTE — Telephone Encounter (Signed)
Please start Enbrel Mini BIV.  Dose: '50mg'$  every 7 days  Dx: RA (M05.79)  Previously tried therapies: Humira - injection site reaction Methotrexate (oral and injectable) - current and will continue in combination with Enbrel  Patient eligible for savings card.  She will plan to bring Humira in cooler to new start visit to donate to [facility].  Knox Saliva, PharmD, MPH, BCPS Clinical Pharmacist (Rheumatology and Pulmonology)

## 2020-10-18 ENCOUNTER — Telehealth: Payer: Self-pay | Admitting: Rheumatology

## 2020-10-18 NOTE — Progress Notes (Signed)
CBC and CMP WNL

## 2020-10-18 NOTE — Telephone Encounter (Signed)
ATC patient to schedule Enbrel new start and enroll patient into savings card Left VM requesting return call to review. I've also sent MyChart message to patient. Unable to enroll patient into savings card d/t email not being considered valid - reached out to patient for an alternate email.  She can start Enbrel on or after 10/23/20.

## 2020-10-18 NOTE — Telephone Encounter (Signed)
Patient returned your call. Patient states you can call her back tomorrow (Thursday), but she will probably have to call you back.

## 2020-10-19 NOTE — Telephone Encounter (Signed)
ATC patient to enroll into Enbrel copay card. Left VM requesting she return call to schedule Enbrel new start. Also advised to enroll into savings card online as soon as possible  Knox Saliva, PharmD, MPH, BCPS Clinical Pharmacist (Rheumatology and Pulmonology)

## 2020-10-19 NOTE — Telephone Encounter (Addendum)
Returned call to patient and advised to sign up for Enbrel savings card. Requested she return call to schedule new start visit  Routing to scheduling team as [place] - patient can be scheduled for new start visit on pharmacy clinic  Knox Saliva, PharmD, MPH, BCPS Clinical Pharmacist (Rheumatology and Pulmonology)

## 2020-10-20 NOTE — Telephone Encounter (Signed)
Patient scheduled for 10/25/2020.

## 2020-10-23 NOTE — Telephone Encounter (Signed)
Patient scheduled for Enbrel new start on 10/25/20  Knox Saliva, PharmD, MPH, BCPS Clinical Pharmacist (Rheumatology and Pulmonology)

## 2020-10-25 ENCOUNTER — Other Ambulatory Visit: Payer: Self-pay

## 2020-10-25 ENCOUNTER — Ambulatory Visit: Payer: BC Managed Care – PPO | Admitting: Pharmacist

## 2020-10-25 VITALS — BP 129/85 | HR 87

## 2020-10-25 DIAGNOSIS — M0579 Rheumatoid arthritis with rheumatoid factor of multiple sites without organ or systems involvement: Secondary | ICD-10-CM

## 2020-10-25 MED ORDER — ENBREL MINI 50 MG/ML ~~LOC~~ SOCT: 50.0000 mg | SUBCUTANEOUS | 0 refills | Status: DC

## 2020-10-25 NOTE — Progress Notes (Signed)
Pharmacy Note  Subjective:   Patient presents to clinic today to receive first dose of Enbrel.  She was previously taking Humira but had injection site reaction. She states this started after she had COVID infection and followed by sinus infection thereafter. Stated injection site reaction occurred usually one day after injection day.  Patient running a fever or have signs/symptoms of infection? No  Patient currently on antibiotics for the treatment of infection? No  Patient have any upcoming invasive procedures/surgeries? No  Objective: CMP     Component Value Date/Time   NA 142 10/17/2020 0921   K 3.9 10/17/2020 0921   CL 109 10/17/2020 0921   CO2 25 10/17/2020 0921   GLUCOSE 66 10/17/2020 0921   BUN 16 10/17/2020 0921   CREATININE 0.73 10/17/2020 0921   CALCIUM 9.1 10/17/2020 0921   PROT 6.7 10/17/2020 0921   ALBUMIN 4.2 10/07/2016 1316   AST 18 10/17/2020 0921   ALT 12 10/17/2020 0921   ALKPHOS 50 10/07/2016 1316   BILITOT 0.4 10/17/2020 0921   GFRNONAA 84 07/18/2020 0846   GFRAA 97 07/18/2020 0846    CBC    Component Value Date/Time   WBC 5.1 10/17/2020 0921   RBC 3.90 10/17/2020 0921   HGB 11.8 10/17/2020 0921   HCT 37.3 10/17/2020 0921   PLT 220 10/17/2020 0921   MCV 95.6 10/17/2020 0921   MCH 30.3 10/17/2020 0921   MCHC 31.6 (L) 10/17/2020 0921   RDW 13.5 10/17/2020 0921   LYMPHSABS 2,305 10/17/2020 0921   MONOABS 300 10/07/2016 1316   EOSABS 459 10/17/2020 0921   BASOSABS 61 10/17/2020 0921    Baseline Immunosuppressant Therapy Labs TB GOLD Quantiferon TB Gold Latest Ref Rng & Units 02/12/2018  Quantiferon TB Gold Plus NEGATIVE POSITIVE(A)   Hepatitis Panel Hepatitis Latest Ref Rng & Units 02/12/2018  Hep B Surface Ag NON-REACTI NON-REACTIVE  Hep B IgM NON-REACTI NON-REACTIVE  Hep C Ab NON-REACTI NON-REACTIVE  Hep C Ab NON-REACTI NON-REACTIVE   HIV Lab Results  Component Value Date   HIV NON-REACTIVE 02/12/2018    Immunoglobulins Immunoglobulin Electrophoresis Latest Ref Rng & Units 02/12/2018  IgA  47 - 310 mg/dL 249  IgG 600 - 1,640 mg/dL 1,190  IgM 50 - 300 mg/dL 111   SPEP Serum Protein Electrophoresis Latest Ref Rng & Units 10/17/2020  Total Protein 6.1 - 8.1 g/dL 6.7  Albumin 3.8 - 4.8 g/dL -  Alpha-1 0.2 - 0.3 g/dL -  Alpha-2 0.5 - 0.9 g/dL -  Beta Globulin 0.4 - 0.6 g/dL -  Beta 2 0.2 - 0.5 g/dL -  Gamma Globulin 0.8 - 1.7 g/dL -   G6PD No results found for: G6PDH TPMT No results found for: TPMT   Chest x-ray: 05/17/20 - wnl  Assessment/Plan:  Demonstrated proper injection technique with Enbrel Mini demo device  Patient able to demonstrate proper injection technique using the teach back method.  Patient self injected in the right thigh with:  Sample Medication: Enbrel Mini '50mg'$ /mL Mini cartridge  NDC: [id number] Lot: [id number] Expiration: 01/2023  Patient provided with Enbrel Sureclick device (used today and taken home for future use). [place]: [id number] Lot: [id number] Expiration: 05/09/2023  Patient tolerated well.  Observed for 30 mins in office for adverse reaction and none noted.   Patient is to return in 1 month for labs and 6-8 weeks for follow-up appointment.  Standing orders for CBC with diff and CMP with GFR remain in place.  She has positive TB lab and will  require yearly chest x-rays. She is due for next CXR in March 2023.  Enbrel approved through insurance .   Rx sent to: [facility]: [phone].  Patient provided with pharmacy phone number and advised to call later this week to schedule shipment to home. She has savings card on hand and has bene advised to provide this to the pharmacy when scheduling shipment.  All questions encouraged and answered.  Instructed patient to call with any further questions or concerns.  Knox Saliva, PharmD, MPH, BCPS Clinical Pharmacist (Rheumatology and Pulmonology)  10/25/2020 8:26 AM

## 2020-10-25 NOTE — Patient Instructions (Signed)
Your next Enbrel dose is due on 8/24, 8/31, and every 7 days thereafter  Your prescription will be shipped from [facility]. Their phone number is  [phone].  Please call to schedule shipment and confirm address. They will mail your medication to your home.  Labs are due in 1 month then every 3 months. Lab hours are from Monday to Thursday 1:30-4:30pm and Friday 1:30-4pm. You do not need an appointment if you come for labs during these times.  How to manage an injection site reaction: Remember the 5 C's: COUNTER - leave on the counter at least 30 minutes but up to overnight to bring medication to room temperature. This may help prevent stinging COLD - place something cold (like an ice gel pack or cold water bottle) on the injection site just before cleansing with alcohol. This may help reduce pain CLARITIN - use Claritin (generic name is loratadine) for the first two weeks of treatment or the day of, the day before, and the day after injecting. This will help to minimize injection site reactions CORTISONE CREAM - apply if injection site is irritated and itching CALL ME - if injection site reaction is bigger than the size of your fist, looks infected, blisters, or if you develop hives

## 2020-10-27 ENCOUNTER — Telehealth: Payer: Self-pay

## 2020-10-27 NOTE — Telephone Encounter (Signed)
Aldona Bar from [facility] left a voicemail regarding an issue patient reported with her Humira.  She said she had a skin reaction and we want to verify if it is a true allergy or some sort of other issue to make sure we have it documented appropriately.     Phone # [phone]

## 2020-10-27 NOTE — Telephone Encounter (Signed)
Returned call to [facility] and advised that patient received first Enbrel dose in clinic and is discontinuing Humira d/t reaction.  Advised that she will have f/u in 6 weeks to assess response and any reactions.  Nothing further needed.  Knox Saliva, PharmD, MPH, BCPS Clinical Pharmacist (Rheumatology and Pulmonology)

## 2020-10-31 ENCOUNTER — Telehealth: Payer: Self-pay

## 2020-10-31 NOTE — Telephone Encounter (Signed)
Enbrel Nurse partner program called stating they reviewed how to inject, storage and safety information with the patient.  If you have any questions, please call [phone]

## 2020-11-06 DIAGNOSIS — Z1231 Encounter for screening mammogram for malignant neoplasm of breast: Secondary | ICD-10-CM | POA: Diagnosis not present

## 2020-11-12 ENCOUNTER — Other Ambulatory Visit: Payer: Self-pay | Admitting: Physician Assistant

## 2020-11-14 NOTE — Telephone Encounter (Signed)
Next Visit: 12/11/2020  Last Visit: 10/17/2020  Last Fill: 04/20/2020  DX: Rheumatoid arthritis with rheumatoid factor of multiple sites without organ or systems involvement   Current Dose per office note on 10/17/2020: methotrexate 0.8 mL apiece injections once weekly  Labs: 10/17/2020 CBC and CMP WNL.  Okay to refill methotrexate?

## 2020-11-27 NOTE — Progress Notes (Signed)
Office Visit Note  Patient: Samantha Hughes             Date of Birth: 1960/08/25           MRN: [id number]             PCP: Kelton Pillar, MD Referring: Kelton Pillar, MD Visit Date: 12/11/2020 Occupation: '@GUAROCC'$ @  Subjective:  Medication monitoring .  History of Present Illness: LEIGH-ANN SKENDER is a 60 y.o. female with history of seropositive rheumatoid arthritis, discoid lupus, and osteoarthritis.  She is on Enbrel 50 mg sq injections once weekly, methotrexate 0.8 ml sq injections once  weekly, and folic acid 2 mg daily.  She was started on Enbrel on 10/25/20.  She is tolerating Enbrel without any side effects. She states she had a Retail buyer come to her home to assist her with her first injection at home.  She denies any joint pain or joint swelling at this time.  She denies any morning stiffness or nocturnal pain. She has not had any eye pain or photophobia.  She denies any discoid lesions.  She denies any recent infections.     Activities of Daily Living:  Patient reports morning stiffness for 0 minutes.   Patient Denies nocturnal pain.  Difficulty dressing/grooming: Denies Difficulty climbing stairs: Denies Difficulty getting out of chair: Denies Difficulty using hands for taps, buttons, cutlery, and/or writing: Denies  Review of Systems  Constitutional:  Negative for fatigue.  HENT:  Negative for mouth sores, mouth dryness and nose dryness.   Eyes:  Negative for pain, itching and dryness.  Respiratory:  Negative for shortness of breath and difficulty breathing.   Cardiovascular:  Negative for chest pain and palpitations.  Gastrointestinal:  Positive for constipation. Negative for blood in stool and diarrhea.  Endocrine: Negative for increased urination.  Genitourinary:  Negative for difficulty urinating.  Musculoskeletal:  Negative for joint pain, joint pain, joint swelling, myalgias, morning stiffness, muscle tenderness and myalgias.  Skin:  Negative for  color change, rash and redness.  Allergic/Immunologic: Negative for susceptible to infections.  Neurological:  Negative for dizziness, numbness, headaches, memory loss and weakness.  Hematological:  Positive for bruising/bleeding tendency.  Psychiatric/Behavioral:  Negative for confusion.    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   High risk medication use 02/06/2016   Positive PPD, treated 02/06/2016    Past Medical History:  Diagnosis Date   Depression    Osteoarthritis    Rheumatoid arthritis ([place])     Family History  Problem Relation Age of Onset   Diabetes Mother    Hypertension Mother    Diabetes Sister    Diabetes Maternal Grandmother    Cancer Maternal Grandfather    Past Surgical History:  Procedure Laterality Date   eye lift     ingrown toenail Bilateral    bilateral great toes   TUBAL LIGATION     Social History   Social History Narrative   Not on file   Immunization History  Administered Date(s) Administered   Moderna Sars-Covid-2 Vaccination 05/17/2019, 06/15/2019, 03/02/2020, 12/08/2020     Objective: Vital  Signs: BP 118/87 (BP Location: Left Arm, Patient Position: Sitting, Cuff Size: Normal)   Pulse 88   Ht '5\' 6"'$  (1.676 m)   Wt 168 lb 9.6 oz (76.5 kg)   BMI 27.21 kg/m    Physical Exam Vitals and nursing note reviewed.  Constitutional:      Appearance: She is well-developed.  HENT:     Head: Normocephalic and atraumatic.  Eyes:     Conjunctiva/sclera: Conjunctivae normal.  Pulmonary:     Effort: Pulmonary effort is normal.  Abdominal:      Palpations: Abdomen is soft.  Musculoskeletal:     Cervical back: Normal range of motion.  Skin:    General: Skin is warm and dry.     Capillary Refill: Capillary refill takes less than 2 seconds.  Neurological:     Mental Status: She is alert and oriented to person, place, and time.  Psychiatric:        Behavior: Behavior normal.     Musculoskeletal Exam: C-spine, thoracic spine, and lumbar spine good ROM.  Shoulder joints, elbow joints, wrist joints, MCPs, PIPs, and DIPs good ROM with no synovitis.  Complete fist formation bilaterally.  Hip joints, knee joints, and ankle joints have good ROM with no discomfort.  No warmth or effusion of knee joints.  No tenderness or swelling of ankle joints.   CDAI Exam: CDAI Score: 0.2  Patient Global: 1 mm; Provider Global: 1 mm Swollen: 0 ; Tender: 0  Joint Exam 12/11/2020   No joint exam has been documented for this visit   There is currently no information documented on the homunculus. Go to the Rheumatology activity and complete the homunculus joint exam.  Investigation: No additional findings.  Imaging: No results found.  Recent Labs: Lab Results  Component Value Date   WBC 5.1 10/17/2020   HGB 11.8 10/17/2020   PLT 220 10/17/2020   NA 142 10/17/2020   K 3.9 10/17/2020   CL 109 10/17/2020   CO2 25 10/17/2020   GLUCOSE 66 10/17/2020   BUN 16 10/17/2020   CREATININE 0.73 10/17/2020   BILITOT 0.4 10/17/2020   ALKPHOS 50 10/07/2016   AST 18 10/17/2020   ALT 12 10/17/2020   PROT 6.7 10/17/2020   ALBUMIN 4.2 10/07/2016   CALCIUM 9.1 10/17/2020   GFRAA 97 07/18/2020   QFTBGOLDPLUS POSITIVE (A) 02/12/2018    Speciality Comments: Humira (March 2022 to 10/09/20 - stopped d/t injection site reaction), changed to Enbrel on 10/25/20  Procedures:  No procedures performed Allergies: Arava [leflunomide], Plaquenil [hydroxychloroquine sulfate], and Humira [adalimumab]   Assessment / Plan:     Visit Diagnoses: Rheumatoid arthritis  with rheumatoid factor of multiple sites without organ or systems involvement ([place]): She has no tenderness or synovitis on examination today. She has not had any signs or symptoms of a rheumatoid arthritis or scleritis flare.  She is clinically doing well on Enbrel 50 mg sq injections once weekly, methotrexate 0.8 ml sq injections once weekly, and folic acid 2 mg daily.  She initiated Enbrel on 10/25/2020.  She has been tolerating Enbrel without any side effects or injection site reactions.  She previously discontinued Humira due to injection site reactions.  She has not been experiencing any morning stiffness or nocturnal pain.  She tries to remain active walking on a daily basis for exercise.  She will remain on combination therapy.  She is advised to notify us if she develops increased joint pain or joint swelling.  She will follow-up in  the office in 3 months.  High risk medication use: Enbrel 50 mg subcutaneous injections once weekly, methotrexate 0.8 ml sq injections once weekly, folic acid 2 mg by mouth daily.  She initiated enbrel on 10/25/20.  D/c Humira due to injection site reactions.  CXR: no active cardiopulmonary disease on 05/17/20.  CBC and CMP updated on 10/17/20.  She is due to update lab work and then every 3 months to monitor for drug toxicity.  Standing orders for CBC and CMP are in place.  She has not had any recent infections.  Discussed the importance of holding enbrel and MTX if she develops develops signs or symptoms of infection.  Rheumatoid nodulosis ([employer]): Resolved.   Bilateral scleritis: She has not had any signs or symptoms of a flare.  No conjunctival injection noted on examination today.  Discoid lupus: No recent discoid lesions.   Primary osteoarthritis of both hands: She has no tenderness or inflammation on examination today.  Complete fist formation bilaterally.   Primary osteoarthritis of both knees: She has good ROM with no discomfort.  No warmth or effusion of knee  joints.   Primary osteoarthritis of both feet: She is not experiencing any discomfort in her feet at this time.  She has good ROM of both ankle joints with no tenderness or inflammation.  She is wearing proper fitting shoes.   Positive PPD, treated: CXR no active cardiopulmonary disease.   Other medical conditions are listed as follows:   Smoker  Lateral epicondylitis of right elbow: Resolved   Orders: Orders Placed This Encounter  Procedures   CBC with Differential/Platelet   COMPLETE METABOLIC PANEL WITH GFR   No orders of the defined types were placed in this encounter.     Follow-Up Instructions: Return in about 3 months (around 03/13/2021) for Rheumatoid arthritis, discoid lupus, Osteoarthritis.   Ofilia Neas, PA-C  Note - This record has been created using Dragon software.  Chart creation errors have been sought, but may not always  have been located. Such creation errors do not reflect on  the standard of medical care.

## 2020-12-11 ENCOUNTER — Encounter: Payer: Self-pay | Admitting: Physician Assistant

## 2020-12-11 ENCOUNTER — Ambulatory Visit (INDEPENDENT_AMBULATORY_CARE_PROVIDER_SITE_OTHER): Payer: BC Managed Care – PPO | Admitting: Physician Assistant

## 2020-12-11 ENCOUNTER — Other Ambulatory Visit: Payer: Self-pay

## 2020-12-11 VITALS — BP 118/87 | HR 88 | Ht 66.0 in | Wt 168.6 lb

## 2020-12-11 DIAGNOSIS — L930 Discoid lupus erythematosus: Secondary | ICD-10-CM

## 2020-12-11 DIAGNOSIS — M0630 Rheumatoid nodule, unspecified site: Secondary | ICD-10-CM

## 2020-12-11 DIAGNOSIS — R7611 Nonspecific reaction to tuberculin skin test without active tuberculosis: Secondary | ICD-10-CM

## 2020-12-11 DIAGNOSIS — M170 Bilateral primary osteoarthritis of knee: Secondary | ICD-10-CM

## 2020-12-11 DIAGNOSIS — F17200 Nicotine dependence, unspecified, uncomplicated: Secondary | ICD-10-CM

## 2020-12-11 DIAGNOSIS — Z79899 Other long term (current) drug therapy: Secondary | ICD-10-CM

## 2020-12-11 DIAGNOSIS — M19072 Primary osteoarthritis, left ankle and foot: Secondary | ICD-10-CM

## 2020-12-11 DIAGNOSIS — H15003 Unspecified scleritis, bilateral: Secondary | ICD-10-CM

## 2020-12-11 DIAGNOSIS — M19042 Primary osteoarthritis, left hand: Secondary | ICD-10-CM

## 2020-12-11 DIAGNOSIS — M0579 Rheumatoid arthritis with rheumatoid factor of multiple sites without organ or systems involvement: Secondary | ICD-10-CM | POA: Diagnosis not present

## 2020-12-11 DIAGNOSIS — M19071 Primary osteoarthritis, right ankle and foot: Secondary | ICD-10-CM

## 2020-12-11 DIAGNOSIS — M7711 Lateral epicondylitis, right elbow: Secondary | ICD-10-CM

## 2020-12-11 DIAGNOSIS — M19041 Primary osteoarthritis, right hand: Secondary | ICD-10-CM

## 2020-12-11 NOTE — Patient Instructions (Signed)
Standing Labs We placed an order today for your standing lab work.   Please have your standing labs drawn in January and every 3 months   If possible, please have your labs drawn 2 weeks prior to your appointment so that the provider can discuss your results at your appointment.  Please note that you may see your imaging and lab results in MyChart before we have reviewed them. We may be awaiting multiple results to interpret others before contacting you. Please allow our office up to 72 hours to thoroughly review all of the results before contacting the office for clarification of your results.  We have open lab daily: Monday through Thursday from 1:30-4:30 PM and Friday from 1:30-4:00 PM at the office of Dr. Shaili Deveshwar, [facility].   Please be advised, all patients with office appointments requiring lab work will take precedent over walk-in lab work.  If possible, please come for your lab work on Monday and Friday afternoons, as you may experience shorter wait times. The office is located at [address], [address], [place], [place] [address] No appointment is necessary.   Labs are drawn by [facility]. Please bring your co-pay at the time of your lab draw.  You may receive a bill from [facility] for your lab work.  If you wish to have your labs drawn at another location, please call the office 24 hours in advance to send orders.  If you have any questions regarding directions or hours of operation,  please call [phone].   As a reminder, please drink plenty of water prior to coming for your lab work. Thanks!  

## 2020-12-12 LAB — CBC WITH DIFFERENTIAL/PLATELET
Absolute Monocytes: 423 cells/uL (ref 200–950)
Basophils Absolute: 41 cells/uL (ref 0–200)
Basophils Relative: 0.9 %
Eosinophils Absolute: 302 cells/uL (ref 15–500)
Eosinophils Relative: 6.7 %
HCT: 41.5 % (ref 35.0–45.0)
Hemoglobin: 13.5 g/dL (ref 11.7–15.5)
Lymphs Abs: 1980 cells/uL (ref 850–3900)
MCH: 30.9 pg (ref 27.0–33.0)
MCHC: 32.5 g/dL (ref 32.0–36.0)
MCV: 95 fL (ref 80.0–100.0)
MPV: 11.4 fL (ref 7.5–12.5)
Monocytes Relative: 9.4 %
Neutro Abs: 1755 cells/uL (ref 1500–7800)
Neutrophils Relative %: 39 %
Platelets: 227 10*3/uL (ref 140–400)
RBC: 4.37 10*6/uL (ref 3.80–5.10)
RDW: 13.3 % (ref 11.0–15.0)
Total Lymphocyte: 44 %
WBC: 4.5 10*3/uL (ref 3.8–10.8)

## 2020-12-12 LAB — COMPLETE METABOLIC PANEL WITH GFR
AG Ratio: 1.4 (calc) (ref 1.0–2.5)
ALT: 17 U/L (ref 6–29)
Albumin: 4.3 g/dL (ref 3.6–5.1)
Alkaline phosphatase (APISO): 51 U/L (ref 37–153)
BUN: 19 mg/dL (ref 7–25)
CO2: 22 mmol/L (ref 20–32)
Calcium: 9.6 mg/dL (ref 8.6–10.4)
Chloride: 108 mmol/L (ref 98–110)
Creat: 0.71 mg/dL (ref 0.50–1.03)
Globulin: 3 g/dL (calc) (ref 1.9–3.7)
Glucose, Bld: 67 mg/dL (ref 65–99)
Sodium: 140 mmol/L (ref 135–146)
Total Bilirubin: 0.4 mg/dL (ref 0.2–1.2)
Total Protein: 7.3 g/dL (ref 6.1–8.1)
eGFR: 98 mL/min/{1.73_m2} (ref >=60)

## 2020-12-12 NOTE — Progress Notes (Signed)
CBC and CMP WNL

## 2020-12-14 ENCOUNTER — Telehealth: Payer: Self-pay | Admitting: *Deleted

## 2020-12-14 DIAGNOSIS — Z79899 Other long term (current) drug therapy: Secondary | ICD-10-CM

## 2020-12-14 NOTE — Telephone Encounter (Addendum)
With patient's recent labs, AST and Potassium were not able to be performed due to the sample being hemolyzed. Per Hazel Sams, PA-C, patient should com back to have a repeat CMP. Attempted to contact the patient and left message for patient to call the office.

## 2020-12-15 NOTE — Telephone Encounter (Signed)
Attempted to contact the patient and left message for patient to call the office.  

## 2020-12-18 NOTE — Telephone Encounter (Signed)
LMOM for patient to call office to discuss lab results. 

## 2020-12-18 NOTE — Telephone Encounter (Signed)
Patient advised with her recent labs, AST and Potassium were not able to be performed due to the sample being hemolyzed. Per Hazel Sams, PA-C, patient should com back to have a repeat CMP. Patient states she will try to come in today.

## 2020-12-19 ENCOUNTER — Other Ambulatory Visit: Payer: Self-pay | Admitting: *Deleted

## 2020-12-19 DIAGNOSIS — Z79899 Other long term (current) drug therapy: Secondary | ICD-10-CM | POA: Diagnosis not present

## 2020-12-20 LAB — COMPLETE METABOLIC PANEL WITH GFR
AG Ratio: 1.4 (calc) (ref 1.0–2.5)
ALT: 13 U/L (ref 6–29)
AST: 18 U/L (ref 10–35)
Albumin: 4 g/dL (ref 3.6–5.1)
Alkaline phosphatase (APISO): 51 U/L (ref 37–153)
BUN: 18 mg/dL (ref 7–25)
CO2: 27 mmol/L (ref 20–32)
Calcium: 9.7 mg/dL (ref 8.6–10.4)
Chloride: 106 mmol/L (ref 98–110)
Creat: 0.86 mg/dL (ref 0.50–1.03)
Globulin: 2.8 g/dL (calc) (ref 1.9–3.7)
Glucose, Bld: 80 mg/dL (ref 65–99)
Potassium: 4.2 mmol/L (ref 3.5–5.3)
Sodium: 141 mmol/L (ref 135–146)
Total Bilirubin: 0.3 mg/dL (ref 0.2–1.2)
Total Protein: 6.8 g/dL (ref 6.1–8.1)
eGFR: 78 mL/min/{1.73_m2} (ref >=60)

## 2020-12-20 NOTE — Progress Notes (Signed)
CMP WNL

## 2020-12-29 DIAGNOSIS — Z0000 Encounter for general adult medical examination without abnormal findings: Secondary | ICD-10-CM | POA: Diagnosis not present

## 2020-12-29 DIAGNOSIS — Z13220 Encounter for screening for lipoid disorders: Secondary | ICD-10-CM | POA: Diagnosis not present

## 2020-12-30 ENCOUNTER — Other Ambulatory Visit: Payer: Self-pay | Admitting: Physician Assistant

## 2020-12-30 DIAGNOSIS — M0579 Rheumatoid arthritis with rheumatoid factor of multiple sites without organ or systems involvement: Secondary | ICD-10-CM

## 2021-01-01 NOTE — Telephone Encounter (Signed)
Next Visit: 03/20/2021  Last Visit: 12/11/2020  Last Fill: 10/25/2020  [id number] arthritis with rheumatoid factor of multiple sites without organ or systems involvement   Current Dose per office note 12/11/2020:  Enbrel 50 mg subcutaneous injections once weekly  Labs: 12/11/2020 CBC WNL  12/19/2020 CMP WNL  Yearly Chest X-ray: 05/17/2020  No active cardiopulmonary disease.  Okay to refill Enbrel?

## 2021-02-07 ENCOUNTER — Other Ambulatory Visit: Payer: Self-pay | Admitting: Rheumatology

## 2021-02-07 NOTE — Telephone Encounter (Signed)
Next Visit: 03/20/2021  Last Visit: 12/11/2020  Last Fill: 01/04/2020  Dx: Rheumatoid arthritis with rheumatoid factor of multiple sites without organ or systems involvement   Current Dose per office note on 38/10/8755: folic acid 2 mg by mouth daily.  Okay to refill Folic Acid?

## 2021-03-06 NOTE — Progress Notes (Signed)
Office Visit Note  Patient: Samantha Hughes             Date of Birth: 09-08-1960           MRN: [id number]             PCP: Kelton Pillar, MD Referring: Kelton Pillar, MD Visit Date: 03/20/2021 Occupation: @GUAROCC @  Subjective:  Medication management.   History of Present Illness: Samantha Hughes is a 60 y.o. female with history of seropositive rheumatoid arthritis and discoid lupus.  She states she has been doing well on the combination of Enbrel and methotrexate.  She denies any joint pain or joint swelling.  She states although her rheumatoid nodulosis resolved.  She has not had any flares of scleritis.  She denies any recent episodes of rash.  Right lateral epicondylitis resolved.  She has a tennis elbow brace which she uses intermittently.  She states that only the strenuous activities cause some discomfort.  Activities of Daily Living:  Patient reports morning stiffness for 0 minutes.   Patient Denies nocturnal pain.  Difficulty dressing/grooming: Denies Difficulty climbing stairs: Denies Difficulty getting out of chair: Denies Difficulty using hands for taps, buttons, cutlery, and/or writing: Denies  Review of Systems  Constitutional:  Positive for fatigue. Negative for night sweats, weight gain and weight loss.  HENT:  Negative for mouth sores, trouble swallowing, trouble swallowing, mouth dryness and nose dryness.   Eyes:  Negative for pain, redness, itching, visual disturbance and dryness.  Respiratory:  Negative for cough, shortness of breath and difficulty breathing.   Cardiovascular:  Negative for chest pain, palpitations, hypertension, irregular heartbeat and swelling in legs/feet.  Gastrointestinal:  Positive for constipation. Negative for blood in stool and diarrhea.  Endocrine: Negative for increased urination.  Genitourinary:  Negative for difficulty urinating and vaginal dryness.  Musculoskeletal:  Negative for joint pain, joint pain, joint swelling,  myalgias, muscle weakness, morning stiffness, muscle tenderness and myalgias.  Skin:  Positive for color change. Negative for rash, hair loss, redness, skin tightness, ulcers and sensitivity to sunlight.  Allergic/Immunologic: Negative for susceptible to infections.  Neurological:  Positive for numbness. Negative for dizziness, headaches, memory loss, night sweats and weakness.  Hematological:  Positive for bruising/bleeding tendency. Negative for swollen glands.  Psychiatric/Behavioral:  Negative for depressed mood, confusion and sleep disturbance. The patient is not nervous/anxious.    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[place]) 02/06/2016   High risk medication use 02/06/2016   Positive PPD, treated 02/06/2016    Past Medical History:  Diagnosis Date   Depression    Osteoarthritis    Rheumatoid arthritis ([place])     Family History  Problem Relation Age of Onset   Diabetes Mother    Hypertension Mother    Diabetes Sister    Diabetes Maternal Grandmother    Cancer Maternal Grandfather    Past Surgical History:  Procedure Laterality Date   eye lift     ingrown toenail Bilateral    bilateral great toes   TUBAL LIGATION     Social History   Social History Narrative   Not on file  Immunization History  Administered Date(s) Administered   [employer] Vaccination 05/17/2019, 06/15/2019, 03/02/2020, 12/08/2020     Objective: Vital Signs: BP (!) 144/90 (BP Location: Left Arm, Patient Position: Sitting, Cuff Size: Normal)    Pulse 69    Ht 5\' 6"  (1.676 m)    Wt 171 lb 12.8 oz (77.9 kg)     BMI 27.73 kg/m    Physical Exam Vitals and nursing note reviewed.  Constitutional:      Appearance: She is well-developed.  HENT:     Head: Normocephalic and atraumatic.  Eyes:     Conjunctiva/sclera: Conjunctivae normal.  Cardiovascular:     Rate and Rhythm: Normal rate and regular rhythm.     Heart sounds: Normal heart sounds.  Pulmonary:     Effort: Pulmonary effort is normal.     Breath sounds: Normal breath sounds.  Abdominal:     General: Bowel sounds are normal.     Palpations: Abdomen is soft.  Musculoskeletal:     Cervical back: Normal range of motion.  Lymphadenopathy:     Cervical: No cervical adenopathy.  Skin:    General: Skin is warm and dry.     Capillary Refill: Capillary refill takes less than 2 seconds.  Neurological:     Mental Status: She is alert and oriented to person, place, and time.  Psychiatric:        Behavior: Behavior normal.     Musculoskeletal Exam: C-spine was in good range of motion.  Shoulder joints, elbow joints, wrist joints, MCPs PIPs and DIPs with good range of motion with no synovitis.  She had bilateral CMC PIP and DIP thickening.  Hip joints, knee joints, ankles, MTPs and PIPs with good range of motion with no synovitis.  CDAI Exam: CDAI Score: 0  Patient Global: 0 mm; Provider Global: 0 mm Swollen: 0 ; Tender: 0  Joint Exam 03/20/2021   No joint exam has been documented for this visit   There is currently no information documented on the homunculus. Go to the Rheumatology activity and complete the homunculus joint exam.  Investigation: No additional findings.  Imaging: No results found.  Recent Labs: Lab Results  Component Value Date   WBC 4.5 12/11/2020   HGB 13.5 12/11/2020   PLT 227 12/11/2020   NA 141 12/19/2020   K 4.2 12/19/2020   CL 106 12/19/2020   CO2 27 12/19/2020   GLUCOSE 80 12/19/2020   BUN 18 12/19/2020   CREATININE 0.86 12/19/2020   BILITOT 0.3 12/19/2020   ALKPHOS 50 10/07/2016   AST 18  12/19/2020   ALT 13 12/19/2020   PROT 6.8 12/19/2020   ALBUMIN 4.2 10/07/2016   CALCIUM 9.7 12/19/2020   GFRAA 97 07/18/2020   QFTBGOLDPLUS POSITIVE (A) 02/12/2018    Speciality Comments: Humira (March 2022 to 10/09/20 - stopped d/t injection site reaction), changed to Enbrel on 10/25/20  Procedures:  No procedures performed Allergies: Arava [leflunomide], Plaquenil [hydroxychloroquine sulfate], and Humira [adalimumab]   Assessment / Plan:     Visit Diagnoses: Rheumatoid arthritis with rheumatoid factor of multiple sites without organ or systems involvement (HCC)-she had no synovitis on examination.  She denies any joint pain or joint swelling.  She had no recurrence of scleritis.  She will continue current treatment.  I discussed spacing Enbrel to every 10 days.  If she tolerates spacing every 10 days then Enbrel dosing can be spaced to every other week in the future.  If she develops any increased symptoms then  she can go to weekly dosing of Enbrel.  High risk medication use - Enbrel 50 mg subcutaneous injections once weekly, methotrexate 0.8 ml sq injections once weekly, folic acid 2 mg by mouth daily. -Labs obtained on October 11, 2020 showed normal CBC and CMP with GFR.  Plan: CBC with Differential/Platelet, COMPLETE METABOLIC PANEL WITH GFR, today and then every 3 months to monitor for drug toxicity.  DG Chest 2 View.  Chest x-ray obtained on May 19, 2020 was normal.  She was advised to get repeat chest x-ray in April.  Information regarding immunization was placed in the AVS.  She was also advised to hold Enbrel and methotrexate in case she develops an infection and resume after the infection resolves.  She has been getting annual skin examination to screen for skin cancer.  Positive PPD, treated - CXR on May 19, 2020 showed no active cardiopulmonary disease.  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   Bilateral scleritis-she denies any recurrence.  Discoid lupus-she had no active  lesions.  Primary osteoarthritis of both hands-she bilateral PIP and DIP thickening with no synovitis.  Primary osteoarthritis of both knees-she denies any discomfort today.  Primary osteoarthritis of both feet-proper fitting shoes were discussed.  Smoker-smoking cessation was discussed.  Association of smoking with rheumatoid arthritis was discussed.  Orders: Orders Placed This Encounter  Procedures   DG Chest 2 View   CBC with Differential/Platelet   COMPLETE METABOLIC PANEL WITH GFR   No orders of the defined types were placed in this encounter.    Follow-Up Instructions: Return in about 5 months (around 08/18/2021) for Rheumatoid arthritis, DLE.   Bo Merino, MD  Note - This record has been created using Editor, commissioning.  Chart creation errors have been sought, but may not always  have been located. Such creation errors do not reflect on  the standard of medical care.

## 2021-03-17 ENCOUNTER — Other Ambulatory Visit: Payer: Self-pay | Admitting: Rheumatology

## 2021-03-20 ENCOUNTER — Encounter: Payer: Self-pay | Admitting: Rheumatology

## 2021-03-20 ENCOUNTER — Other Ambulatory Visit: Payer: Self-pay

## 2021-03-20 ENCOUNTER — Ambulatory Visit (INDEPENDENT_AMBULATORY_CARE_PROVIDER_SITE_OTHER): Payer: BC Managed Care – PPO | Admitting: Rheumatology

## 2021-03-20 VITALS — BP 144/90 | HR 69 | Ht 66.0 in | Wt 171.8 lb

## 2021-03-20 DIAGNOSIS — M19071 Primary osteoarthritis, right ankle and foot: Secondary | ICD-10-CM

## 2021-03-20 DIAGNOSIS — M0630 Rheumatoid nodule, unspecified site: Secondary | ICD-10-CM | POA: Diagnosis not present

## 2021-03-20 DIAGNOSIS — M0579 Rheumatoid arthritis with rheumatoid factor of multiple sites without organ or systems involvement: Secondary | ICD-10-CM | POA: Diagnosis not present

## 2021-03-20 DIAGNOSIS — M19072 Primary osteoarthritis, left ankle and foot: Secondary | ICD-10-CM

## 2021-03-20 DIAGNOSIS — M170 Bilateral primary osteoarthritis of knee: Secondary | ICD-10-CM

## 2021-03-20 DIAGNOSIS — R7611 Nonspecific reaction to tuberculin skin test without active tuberculosis: Secondary | ICD-10-CM | POA: Diagnosis not present

## 2021-03-20 DIAGNOSIS — Z79899 Other long term (current) drug therapy: Secondary | ICD-10-CM | POA: Diagnosis not present

## 2021-03-20 DIAGNOSIS — M19041 Primary osteoarthritis, right hand: Secondary | ICD-10-CM

## 2021-03-20 DIAGNOSIS — F17200 Nicotine dependence, unspecified, uncomplicated: Secondary | ICD-10-CM

## 2021-03-20 DIAGNOSIS — M19042 Primary osteoarthritis, left hand: Secondary | ICD-10-CM

## 2021-03-20 DIAGNOSIS — H15003 Unspecified scleritis, bilateral: Secondary | ICD-10-CM

## 2021-03-20 DIAGNOSIS — L930 Discoid lupus erythematosus: Secondary | ICD-10-CM

## 2021-03-20 LAB — CBC WITH DIFFERENTIAL/PLATELET
Absolute Monocytes: 302 cells/uL (ref 200–950)
Basophils Absolute: 49 cells/uL (ref 0–200)
Basophils Relative: 0.9 %
Eosinophils Absolute: 351 cells/uL (ref 15–500)
Eosinophils Relative: 6.5 %
HCT: 37.1 % (ref 35.0–45.0)
Hemoglobin: 12.3 g/dL (ref 11.7–15.5)
Lymphs Abs: 2614 cells/uL (ref 850–3900)
MCH: 31.5 pg (ref 27.0–33.0)
MCHC: 33.2 g/dL (ref 32.0–36.0)
MCV: 94.9 fL (ref 80.0–100.0)
MPV: 11.8 fL (ref 7.5–12.5)
Monocytes Relative: 5.6 %
Neutro Abs: 2084 cells/uL (ref 1500–7800)
Neutrophils Relative %: 38.6 %
Platelets: 232 10*3/uL (ref 140–400)
RBC: 3.91 10*6/uL (ref 3.80–5.10)
RDW: 12.9 % (ref 11.0–15.0)
Total Lymphocyte: 48.4 %
WBC: 5.4 10*3/uL (ref 3.8–10.8)

## 2021-03-20 LAB — COMPLETE METABOLIC PANEL WITH GFR
AG Ratio: 1.6 (calc) (ref 1.0–2.5)
ALT: 14 U/L (ref 6–29)
AST: 20 U/L (ref 10–35)
Albumin: 4.3 g/dL (ref 3.6–5.1)
Alkaline phosphatase (APISO): 50 U/L (ref 37–153)
BUN: 13 mg/dL (ref 7–25)
CO2: 27 mmol/L (ref 20–32)
Calcium: 9.5 mg/dL (ref 8.6–10.4)
Chloride: 107 mmol/L (ref 98–110)
Creat: 0.63 mg/dL (ref 0.50–1.03)
Globulin: 2.7 g/dL (calc) (ref 1.9–3.7)
Glucose, Bld: 67 mg/dL (ref 65–99)
Potassium: 3.9 mmol/L (ref 3.5–5.3)
Sodium: 141 mmol/L (ref 135–146)
Total Bilirubin: 0.4 mg/dL (ref 0.2–1.2)
Total Protein: 7 g/dL (ref 6.1–8.1)
eGFR: 102 mL/min/{1.73_m2} (ref >=60)

## 2021-03-20 NOTE — Patient Instructions (Signed)
Standing Labs We placed an order today for your standing lab work.   Please have your standing labs drawn in April and every 3 months  If possible, please have your labs drawn 2 weeks prior to your appointment so that the provider can discuss your results at your appointment.  Please note that you may see your imaging and lab results in [place] before we have reviewed them. We may be awaiting multiple results to interpret others before contacting you. Please allow our office up to 72 hours to thoroughly review all of the results before contacting the office for clarification of your results.  We have open lab daily: Monday through Thursday from 1:30-4:30 PM and Friday from 1:30-4:00 PM at the office of Dr. Bo Merino, [facility].   Please be advised, all patients with office appointments requiring lab work will take precedent over walk-in lab work.  If possible, please come for your lab work on Monday and Friday afternoons, as you may experience shorter wait times. The office is located at [address] [place], [place], [place] [address] No appointment is necessary.   Labs are drawn by [facility]. Please bring your co-pay at the time of your lab draw.  You may receive a bill from [facility] for your lab work.  Vaccines You are taking a medication(s) that can suppress your immune system.  The following immunizations are recommended: Flu annually Covid-19  Td/Tdap (tetanus, diphtheria, pertussis) every 10 years Pneumonia (Prevnar 15 then Pneumovax 23 at least 1 year apart.  Alternatively, can take Prevnar 20 without needing additional dose) Shingrix: 2 doses from 4 weeks to 6 months apart  Please check with your PCP to make sure you are up to date.   If you wish to have your labs drawn at another location, please call the office 24 hours in advance to send orders.  If you have any questions regarding directions or hours of operation,  please call [phone].   As a  reminder, please drink plenty of water prior to coming for your lab work. Thanks!   If you have signs or symptoms of an infection or start antibiotics: First, call your PCP for workup of your infection. Hold your medication through the infection, until you complete your antibiotics, and until symptoms resolve if you take the following: Injectable medication (Actemra, Benlysta, Cimzia, Cosentyx, Enbrel, Humira, Kevzara, Orencia, Remicade, Simponi, Stelara, Taltz, Tremfya) Methotrexate Leflunomide (Arava) Mycophenolate (Cellcept) Morrie Sheldon, Olumiant, or Rinvoq  Please get annual skin examination to screen for skin cancer while you are on Enbrel.

## 2021-03-21 NOTE — Progress Notes (Signed)
CBC and CMP normal

## 2021-03-28 ENCOUNTER — Telehealth: Payer: Self-pay | Admitting: Pharmacist

## 2021-03-28 DIAGNOSIS — M0579 Rheumatoid arthritis with rheumatoid factor of multiple sites without organ or systems involvement: Secondary | ICD-10-CM

## 2021-03-28 NOTE — Telephone Encounter (Signed)
Submitted a Prior Authorization RENEWAL request to [facility] for ENBREL via CoverMyMeds. Will update once we receive a response.  Key: [id number]  Knox Saliva, PharmD, MPH, BCPS Clinical Pharmacist (Rheumatology and Pulmonology)

## 2021-03-29 ENCOUNTER — Other Ambulatory Visit (HOSPITAL_COMMUNITY): Payer: Self-pay

## 2021-03-29 MED ORDER — ENBREL MINI 50 MG/ML ~~LOC~~ SOCT: SUBCUTANEOUS | 0 refills | Status: DC

## 2021-03-29 NOTE — Telephone Encounter (Addendum)
Received notification from [facility] regarding a prior authorization for ENBREL. Authorization has been APPROVED from 03/28/21 to 09/25/21.   Patient must fill through [facility]: [phone]   Authorization # [id number]  Rx sent to [facility] today. ATC patient to review - left detailed VM with pharmacy information to call and schedule shipment.  Knox Saliva, PharmD, MPH, BCPS Clinical Pharmacist (Rheumatology and Pulmonology)

## 2021-04-03 DIAGNOSIS — H6692 Otitis media, unspecified, left ear: Secondary | ICD-10-CM | POA: Diagnosis not present

## 2021-04-03 DIAGNOSIS — H6982 Other specified disorders of Eustachian tube, left ear: Secondary | ICD-10-CM | POA: Diagnosis not present

## 2021-04-10 ENCOUNTER — Other Ambulatory Visit: Payer: Self-pay | Admitting: Rheumatology

## 2021-04-10 NOTE — Telephone Encounter (Signed)
Next Visit: 08/22/2021  Last Visit: 03/20/2021  Last Fill: 11/14/2020  DX: Rheumatoid arthritis with rheumatoid factor of multiple sites without organ or systems involvement   Current Dose per office note 03/20/2021: methotrexate 0.8 ml sq injections once weekly  Labs: 03/20/2021 CBC and CMP normal  Okay to refill MTX?

## 2021-04-11 ENCOUNTER — Other Ambulatory Visit: Payer: Self-pay | Admitting: Rheumatology

## 2021-04-11 DIAGNOSIS — M0579 Rheumatoid arthritis with rheumatoid factor of multiple sites without organ or systems involvement: Secondary | ICD-10-CM

## 2021-04-12 ENCOUNTER — Telehealth: Payer: Self-pay

## 2021-04-12 ENCOUNTER — Other Ambulatory Visit: Payer: Self-pay | Admitting: Rheumatology

## 2021-04-12 NOTE — Telephone Encounter (Signed)
Patient called stating she was returning Andrea's call.

## 2021-04-12 NOTE — Telephone Encounter (Signed)
Patient states she was seen at the doctor about 1 week ago. Patient states she has an ear infection and was given the antibiotic for 10 days. Patient states her last Enbrel was 04/01/2021. Patient states she is having in hips and her hands and her back. She is also having pain in her feet. Patient states she is also having swelling in her hands and feet. Please advise.

## 2021-04-12 NOTE — Telephone Encounter (Signed)
Attempted to contact the patient and unable to leave a message, voicemail not set up.

## 2021-04-12 NOTE — Telephone Encounter (Signed)
See previous phone note.  

## 2021-04-12 NOTE — Telephone Encounter (Signed)
Patient called the office stating she is going on 2 weeks without Embrel and Methotrexate because she was on antibiotics. Patient states she is having swelling and pain. Patient states she cannot take Embrel until Friday. Patient states she cannot wait until Friday for the medication to kick in. [facility] [address] Patient states she is at work so if anyone needs to speak with her [phone] is the best number to contact her at.

## 2021-04-13 MED ORDER — PREDNISONE 5 MG PO TABS: ORAL_TABLET | ORAL | 0 refills | Status: DC

## 2021-04-13 NOTE — Telephone Encounter (Signed)
Patient advised she will need to continue to hold enbrel and MTX until the infection has completely cleared and she has completed the course of antibiotics.   Prescription sent in for prednisone taper starting at 20 mg tapering by 5 mg every 2 days.

## 2021-04-13 NOTE — Telephone Encounter (Signed)
She will need to continue to hold enbrel and MTX until the infection has completely cleared and she has completed the course of antibiotics.   Ok to send in a prednisone taper starting at 20 mg tapering by 5 mg every 2 days.

## 2021-04-13 NOTE — Addendum Note (Signed)
Addended by: Carole Binning on: 04/13/2021 09:03 AM   Modules accepted: Orders

## 2021-04-13 NOTE — Telephone Encounter (Signed)
Attempted to contact the patient and left message for patient to call the office.  

## 2021-05-23 ENCOUNTER — Other Ambulatory Visit: Payer: Self-pay | Admitting: *Deleted

## 2021-05-23 DIAGNOSIS — Z79899 Other long term (current) drug therapy: Secondary | ICD-10-CM | POA: Diagnosis not present

## 2021-05-24 LAB — CBC WITH DIFFERENTIAL/PLATELET
Absolute Monocytes: 428 cells/uL (ref 200–950)
Basophils Absolute: 37 cells/uL (ref 0–200)
Basophils Relative: 0.6 %
Eosinophils Absolute: 397 cells/uL (ref 15–500)
Eosinophils Relative: 6.4 %
HCT: 36.8 % (ref 35.0–45.0)
Hemoglobin: 12.1 g/dL (ref 11.7–15.5)
Lymphs Abs: 2685 cells/uL (ref 850–3900)
MCH: 31.3 pg (ref 27.0–33.0)
MCHC: 32.9 g/dL (ref 32.0–36.0)
MCV: 95.3 fL (ref 80.0–100.0)
MPV: 11.2 fL (ref 7.5–12.5)
Monocytes Relative: 6.9 %
Neutro Abs: 2654 cells/uL (ref 1500–7800)
Neutrophils Relative %: 42.8 %
Platelets: 219 10*3/uL (ref 140–400)
RBC: 3.86 10*6/uL (ref 3.80–5.10)
RDW: 13.1 % (ref 11.0–15.0)
Total Lymphocyte: 43.3 %
WBC: 6.2 10*3/uL (ref 3.8–10.8)

## 2021-05-24 LAB — COMPLETE METABOLIC PANEL WITH GFR
AG Ratio: 1.5 (calc) (ref 1.0–2.5)
ALT: 13 U/L (ref 6–29)
AST: 19 U/L (ref 10–35)
Albumin: 4.2 g/dL (ref 3.6–5.1)
Alkaline phosphatase (APISO): 50 U/L (ref 37–153)
BUN: 19 mg/dL (ref 7–25)
CO2: 28 mmol/L (ref 20–32)
Calcium: 9.8 mg/dL (ref 8.6–10.4)
Chloride: 108 mmol/L (ref 98–110)
Creat: 0.81 mg/dL (ref 0.50–1.03)
Globulin: 2.8 g/dL (calc) (ref 1.9–3.7)
Glucose, Bld: 81 mg/dL (ref 65–99)
Potassium: 4.7 mmol/L (ref 3.5–5.3)
Sodium: 141 mmol/L (ref 135–146)
Total Bilirubin: 0.3 mg/dL (ref 0.2–1.2)
Total Protein: 7 g/dL (ref 6.1–8.1)
eGFR: 84 mL/min/{1.73_m2} (ref >=60)

## 2021-05-24 NOTE — Progress Notes (Signed)
CBC and CMP are normal.

## 2021-07-02 ENCOUNTER — Other Ambulatory Visit: Payer: Self-pay | Admitting: Physician Assistant

## 2021-07-02 NOTE — Telephone Encounter (Signed)
Next Visit: 08/22/2021 ?  ?Last Visit: 03/20/2021 ?  ?Last Fill: 04/10/2021  ? ?DX: Rheumatoid arthritis with rheumatoid factor of multiple sites without organ or systems involvement  ?  ?Current Dose per office note 03/20/2021: methotrexate 0.8 ml sq injections once weekly ?  ?Labs: 05/23/2021 CBC and CMP are normal. ? ?Okay to refill MTX?  ?

## 2021-08-07 ENCOUNTER — Ambulatory Visit
Admission: RE | Admit: 2021-08-07 | Discharge: 2021-08-07 | Disposition: A | Discharge status: Home / Self Care | Payer: BC Managed Care – PPO | Source: Ambulatory Visit | Attending: Rheumatology | Admitting: Rheumatology

## 2021-08-07 DIAGNOSIS — Z79899 Other long term (current) drug therapy: Secondary | ICD-10-CM

## 2021-08-07 DIAGNOSIS — M47814 Spondylosis without myelopathy or radiculopathy, thoracic region: Secondary | ICD-10-CM | POA: Diagnosis not present

## 2021-08-07 DIAGNOSIS — R221 Localized swelling, mass and lump, neck: Secondary | ICD-10-CM | POA: Diagnosis not present

## 2021-08-07 DIAGNOSIS — R7611 Nonspecific reaction to tuberculin skin test without active tuberculosis: Secondary | ICD-10-CM | POA: Diagnosis not present

## 2021-08-07 DIAGNOSIS — K5909 Other constipation: Secondary | ICD-10-CM | POA: Diagnosis not present

## 2021-08-07 DIAGNOSIS — Z8349 Family history of other endocrine, nutritional and metabolic diseases: Secondary | ICD-10-CM | POA: Diagnosis not present

## 2021-08-07 DIAGNOSIS — I700 Atherosclerosis of aorta: Secondary | ICD-10-CM | POA: Diagnosis not present

## 2021-08-08 ENCOUNTER — Other Ambulatory Visit: Payer: Self-pay | Admitting: Internal Medicine

## 2021-08-08 DIAGNOSIS — Z8349 Family history of other endocrine, nutritional and metabolic diseases: Secondary | ICD-10-CM

## 2021-08-08 NOTE — Progress Notes (Signed)
I discussed x-ray results with the patient.  Please forward the results to her PCP.

## 2021-08-09 NOTE — Progress Notes (Unsigned)
Office Visit Note  Patient: Samantha Hughes             Date of Birth: 1960/03/14           MRN: [id number]             PCP: Kelton Pillar, MD Referring: Kelton Pillar, MD Visit Date: 08/22/2021 Occupation: '@GUAROCC'$ @  Subjective:  Left trapezius muscle tension and tenderness   History of Present Illness: Samantha Hughes is a 61 y.o. female with history of seropositive rheumatoid arthritis and osteoarthritis. She is on Enbrel 50 mg subcutaneous injections once weekly, methotrexate 0.8 ml sq injections once weekly, folic acid 2 mg by mouth daily.  She is tolerating these medications without any side effects and has not missed any doses recently.  She denies any signs or symptoms of a flare.  Her morning stiffness has been lasting about 5 minutes daily.  She has not had any nocturnal pain or difficulty with ADLs.  She denies any joint swelling at this time.  She is experiencing some stiffness in the left side of her neck.  She has no symptoms of radiculopathy.  Her symptoms improved after massage as well as using heating pad but have returned. She states that she is scheduled today for a thyroid biopsy after having found an nodule on ultrasound in 08/10/21.  She denies any other new medical conditions since her last office visit.     Activities of Daily Living:  Patient reports morning stiffness for 5 minutes.   Patient Denies nocturnal pain.  Difficulty dressing/grooming: Denies Difficulty climbing stairs: Denies Difficulty getting out of chair: Denies Difficulty using hands for taps, buttons, cutlery, and/or writing: Denies  Review of Systems  Constitutional:  Negative for fatigue.  HENT:  Negative for mouth dryness.   Eyes:  Negative for dryness.  Respiratory:  Negative for shortness of breath.   Cardiovascular:  Negative for swelling in legs/feet.  Gastrointestinal:  Positive for constipation.  Endocrine: Negative for excessive thirst.  Genitourinary:  Negative for difficulty  urinating.  Musculoskeletal:  Positive for joint pain, joint pain and joint swelling.  Skin:  Negative for rash.  Allergic/Immunologic: Negative for susceptible to infections.  Neurological:  Negative for numbness.  Hematological:  Positive for bruising/bleeding tendency.  Psychiatric/Behavioral:  Negative for sleep disturbance.     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   High risk medication use 02/06/2016   Positive PPD, treated 02/06/2016    Past Medical History:  Diagnosis Date   Depression    Osteoarthritis    Rheumatoid arthritis ([place])    Thyroid nodule     Family History  Problem Relation Age of Onset   Diabetes Mother    Hypertension Mother    Diabetes Sister    Diabetes Maternal Grandmother    Cancer Maternal Grandfather    Past Surgical History:  Procedure Laterality Date   eye lift     ingrown toenail Bilateral    bilateral great toes   TUBAL LIGATION     Social History   Social History Narrative   Not on file   Immunization  History  Administered Date(s) Administered   Moderna Sars-Covid-2 Vaccination 05/17/2019, 06/15/2019, 03/02/2020, 12/08/2020     Objective: Vital Signs: BP (!) 146/90 (BP Location: Left Arm, Patient Position: Sitting, Cuff Size: Small)   Pulse 80   Resp 12   Ht '5\' 6"'$  (1.676 m)   Wt 169 lb (76.7 kg)   BMI 27.28 kg/m    Physical Exam Vitals and nursing note reviewed.  Constitutional:      Appearance: She is well-developed.  HENT:     Head: Normocephalic and atraumatic.  Eyes:     Conjunctiva/sclera: Conjunctivae normal.   Cardiovascular:     Rate and Rhythm: Normal rate and regular rhythm.     Heart sounds: Normal heart sounds.  Pulmonary:     Effort: Pulmonary effort is normal.     Breath sounds: Normal breath sounds.  Abdominal:     General: Bowel sounds are normal.     Palpations: Abdomen is soft.  Musculoskeletal:     Cervical back: Normal range of motion.  Skin:    General: Skin is warm and dry.     Capillary Refill: Capillary refill takes less than 2 seconds.  Neurological:     Mental Status: She is alert and oriented to person, place, and time.  Psychiatric:        Behavior: Behavior normal.      Musculoskeletal Exam: C-spine has slightly limited range of motion with lateral rotation to the left.  Left trapezius muscle tension and tenderness noted.  No midline spinal tenderness or SI joint tenderness.  Shoulder joints, elbow joints, wrist joints, MCPs, PIPs, DIPs have good range of motion with no synovitis.  Synovial thickening of the right second MCP joint noted.  Complete fist formation bilaterally.  Hip joints have good range of motion with no groin pain.  Knee joints have good range of motion with no warmth or effusion.  Ankle joints have good range of motion with no tenderness or joint swelling.  No tenderness over MTP joints.  CDAI Exam: CDAI Score: 0.4  Patient Global: 2 mm; Provider Global: 2 mm Swollen: 0 ; Tender: 0  Joint Exam 08/22/2021   No joint exam has been documented for this visit   There is currently no information documented on the homunculus. Go to the Rheumatology activity and complete the homunculus joint exam.  Investigation: No additional findings.  Imaging: US THYROID  Result Date: 08/10/2021 CLINICAL DATA:  Family history of thyroid disease EXAM: THYROID ULTRASOUND TECHNIQUE: Ultrasound examination of the thyroid gland and adjacent soft tissues was performed. COMPARISON:  None Available. FINDINGS: Parenchymal Echotexture: Markedly heterogenous Isthmus: 0.5 cm  Right lobe: 4.6 x 1.5 x 1.7 cm Left lobe: 6.8 x 3.0 x 3.7 cm _________________________________________________________ Estimated total number of nodules >/= 1 cm: Number of spongiform nodules >/=  2 cm not described below (TR1): 0 Number of mixed cystic and solid nodules >/= 1.5 cm not described below (TR2): 0 _________________________________________________________ Mildly heterogeneous appearance of the right thyroid lobe without discrete nodule. Asymmetric markedly enlarged and heterogeneous appearance of the left thyroid lobe. Suspect this is due to multiple underlying nodules, although discrete borders are difficult to visualized given heterogeneous background. The largest of these appears to be an approximately 3.1 x 2.9 x 2.2 cm solid isoechoic TR 3 nodule in the inferior left thyroid lobe (see series 2, image 129, series 3, image 87). **Given size (>/= 2.5 cm) and appearance, fine needle aspiration of this mildly suspicious nodule should be considered based  on TI-RADS criteria. IMPRESSION: Asymmetric markedly enlarged and heterogeneous appearance of the left thyroid lobe. Suspect this is due to multiple underlying nodules, although discrete borders are difficult to visualize given heterogeneous background. There appears to be at least a 3.1 cm TR 3 nodule in the inferior left thyroid lobe, which meets criteria for biopsy. Surgical consultation is also recommended. The above is in keeping with the ACR TI-RADS recommendations - J Am Coll Radiol 2017;14:587-595. Electronically Signed   By: Albin Felling M.D.   On: 08/10/2021 10:26   DG Chest 2 View  Result Date: 08/07/2021 CLINICAL DATA:  History of positive PPD. No chest complaints. Immunosuppressive therapy. EXAM: CHEST - 2 VIEW COMPARISON:  Chest two views 05/17/2020 FINDINGS: Cardiac silhouette and mediastinal contours are within normal limits. Mild calcification within aortic arch. The lungs are clear. No pleural effusion or pneumothorax.  Mild-to-moderate multilevel degenerative disc changes of the midthoracic spine. Old healed left clavicular fracture. IMPRESSION: No thoracic lymphadenopathy.  Clear lungs. Electronically Signed   By: Yvonne Kendall M.D.   On: 08/07/2021 20:58    Recent Labs: Lab Results  Component Value Date   WBC 4.9 08/10/2021   HGB 12.7 08/10/2021   PLT 232 08/10/2021   NA 142 08/10/2021   K 4.8 08/10/2021   CL 109 08/10/2021   CO2 20 08/10/2021   GLUCOSE 63 (L) 08/10/2021   BUN 20 08/10/2021   CREATININE 0.85 08/10/2021   BILITOT 0.4 08/10/2021   ALKPHOS 50 10/07/2016   AST 23 08/10/2021   ALT 15 08/10/2021   PROT 7.3 08/10/2021   ALBUMIN 4.2 10/07/2016   CALCIUM 9.7 08/10/2021   GFRAA 97 07/18/2020   QFTBGOLDPLUS POSITIVE (A) 02/12/2018    Speciality Comments: Humira (March 2022 to 10/09/20 - stopped d/t injection site reaction), changed to Enbrel on 10/25/20  Procedures:  No procedures performed Allergies: Arava [leflunomide], Plaquenil [hydroxychloroquine sulfate], and Humira [adalimumab]   Assessment / Plan:     Visit Diagnoses: Rheumatoid arthritis with rheumatoid factor of multiple sites without organ or systems involvement ([place]): She has no joint tenderness or synovitis on examination today.  She has not had any signs or symptoms of a rheumatoid arthritis flare.  She has clinically been doing well on Enbrel 50 mg every days injections once weekly methotrexate 0.8 mL subcu injections once weekly.  She is tolerating both medications without any side effects and has not missed any doses recently.  Her morning stiffness has only been lasting 5 minutes daily.  She has not had any nocturnal pain or difficulty with ADLs.  She will remain on combination therapy as prescribed.  She is advised to notify us if she develops increased joint pain or joint swelling.  She will follow-up in the office in 5 months or sooner if needed.  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   High risk medication use -  Enbrel 50 mg subcutaneous injections once weekly, methotrexate 0.8 ml sq injections once weekly, folic acid 2 mg by mouth daily.  CBC and CMP updated on 08/10/21.  She will be due to update lab work September and every 3 months.  CXR no thoracic lymphadenopathy.  Clear lungs.   Discussed the importance of holding Enbrel and methotrexate if she develops signs or symptoms of infection and to resume once the infection has completely cleared. Discussed the importance of yearly skin examinations while on Enbrel.  She has an upcoming appointment scheduled with her dermatologist.  Positive PPD, treated - CXR 08/07/21: clear lungs.  Bilateral scleritis: She has not had any signs or symptoms of a scleritis flare.  No conjunctival injection noted on examination today.  Discoid lupus: She has no discoid lesions at this time.  Trapezius muscle spasm: She presents today with left trapezius muscle tension and tenderness.  She has slightly limited lateral rotation to the left of her C-spine.  No symptoms of radiculopathy currently.  Her symptoms improved with massage as well as using heat.  Discussed conservative treatment options.  If her symptoms persist or worsen she can return for a left trigger point injection in the future.  Primary osteoarthritis of both hands: Mild PIP and DIP prominence consistent with osteoarthritis of both hands.  No tenderness or inflammation noted on examination today.  Complete fist formation bilaterally.  Primary osteoarthritis of both knees: She has good range of motion of both knee joints on examination.  No warmth or effusion was noted.  Primary osteoarthritis of both feet: She is not experiencing any discomfort in her feet at this time.  She is good range of motion of both ankle joints with no tenderness or synovitis.  Smoker  Thyroid nodule: Thyroid ultrasound on 08/10/2021: Asymmetric markedly enlarged and heterogeneous appearance of left thyroid lobe.  Multiple underlying  nodules are suspected.  She is scheduled for a biopsy today at 4 PM.  Orders: No orders of the defined types were placed in this encounter.  No orders of the defined types were placed in this encounter.    Follow-Up Instructions: Return in about 5 months (around 01/22/2022) for Rheumatoid arthritis, Osteoarthritis.   Ofilia Neas, PA-C  Note - This record has been created using Dragon software.  Chart creation errors have been sought, but may not always  have been located. Such creation errors do not reflect on  the standard of medical care.

## 2021-08-10 ENCOUNTER — Other Ambulatory Visit: Payer: Self-pay | Admitting: *Deleted

## 2021-08-10 ENCOUNTER — Ambulatory Visit
Admission: RE | Admit: 2021-08-10 | Discharge: 2021-08-10 | Disposition: A | Discharge status: Home / Self Care | Payer: BC Managed Care – PPO | Source: Ambulatory Visit | Attending: Internal Medicine | Admitting: Internal Medicine

## 2021-08-10 DIAGNOSIS — Z8349 Family history of other endocrine, nutritional and metabolic diseases: Secondary | ICD-10-CM

## 2021-08-10 DIAGNOSIS — E049 Nontoxic goiter, unspecified: Secondary | ICD-10-CM | POA: Diagnosis not present

## 2021-08-10 DIAGNOSIS — Z79899 Other long term (current) drug therapy: Secondary | ICD-10-CM

## 2021-08-11 LAB — CBC WITH DIFFERENTIAL/PLATELET
Absolute Monocytes: 348 cells/uL (ref 200–950)
Basophils Absolute: 49 cells/uL (ref 0–200)
Basophils Relative: 1 %
Eosinophils Absolute: 328 cells/uL (ref 15–500)
Eosinophils Relative: 6.7 %
HCT: 38.3 % (ref 35.0–45.0)
Hemoglobin: 12.7 g/dL (ref 11.7–15.5)
Lymphs Abs: 2220 cells/uL (ref 850–3900)
MCH: 31.8 pg (ref 27.0–33.0)
MCHC: 33.2 g/dL (ref 32.0–36.0)
MCV: 96 fL (ref 80.0–100.0)
MPV: 11.5 fL (ref 7.5–12.5)
Monocytes Relative: 7.1 %
Neutro Abs: 1955 cells/uL (ref 1500–7800)
Neutrophils Relative %: 39.9 %
Platelets: 232 10*3/uL (ref 140–400)
RBC: 3.99 10*6/uL (ref 3.80–5.10)
RDW: 13.1 % (ref 11.0–15.0)
Total Lymphocyte: 45.3 %
WBC: 4.9 10*3/uL (ref 3.8–10.8)

## 2021-08-11 LAB — COMPLETE METABOLIC PANEL WITH GFR
AG Ratio: 1.4 (calc) (ref 1.0–2.5)
ALT: 15 U/L (ref 6–29)
AST: 23 U/L (ref 10–35)
Albumin: 4.2 g/dL (ref 3.6–5.1)
Alkaline phosphatase (APISO): 50 U/L (ref 37–153)
BUN: 20 mg/dL (ref 7–25)
CO2: 20 mmol/L (ref 20–32)
Calcium: 9.7 mg/dL (ref 8.6–10.4)
Chloride: 109 mmol/L (ref 98–110)
Creat: 0.85 mg/dL (ref 0.50–1.03)
Globulin: 3.1 g/dL (calc) (ref 1.9–3.7)
Glucose, Bld: 63 mg/dL — ABNORMAL LOW (ref 65–99)
Potassium: 4.8 mmol/L (ref 3.5–5.3)
Sodium: 142 mmol/L (ref 135–146)
Total Bilirubin: 0.4 mg/dL (ref 0.2–1.2)
Total Protein: 7.3 g/dL (ref 6.1–8.1)
eGFR: 79 mL/min/{1.73_m2} (ref >=60)

## 2021-08-12 NOTE — Progress Notes (Signed)
CBC and CMP are normal.

## 2021-08-16 ENCOUNTER — Other Ambulatory Visit: Payer: Self-pay | Admitting: Internal Medicine

## 2021-08-16 DIAGNOSIS — E041 Nontoxic single thyroid nodule: Secondary | ICD-10-CM

## 2021-08-22 ENCOUNTER — Ambulatory Visit
Admission: RE | Admit: 2021-08-22 | Discharge: 2021-08-22 | Disposition: A | Discharge status: Home / Self Care | Payer: BC Managed Care – PPO | Source: Ambulatory Visit | Attending: Internal Medicine | Admitting: Internal Medicine

## 2021-08-22 ENCOUNTER — Other Ambulatory Visit (HOSPITAL_COMMUNITY)
Admission: RE | Admit: 2021-08-22 | Discharge: 2021-08-22 | Disposition: A | Discharge status: Home / Self Care | Payer: BC Managed Care – PPO | Source: Ambulatory Visit | Attending: Internal Medicine | Admitting: Internal Medicine

## 2021-08-22 ENCOUNTER — Encounter: Payer: Self-pay | Admitting: Physician Assistant

## 2021-08-22 ENCOUNTER — Ambulatory Visit (INDEPENDENT_AMBULATORY_CARE_PROVIDER_SITE_OTHER): Payer: BC Managed Care – PPO | Admitting: Physician Assistant

## 2021-08-22 VITALS — BP 146/90 | HR 80 | Resp 12 | Ht 66.0 in | Wt 169.0 lb

## 2021-08-22 DIAGNOSIS — H15003 Unspecified scleritis, bilateral: Secondary | ICD-10-CM

## 2021-08-22 DIAGNOSIS — Z79899 Other long term (current) drug therapy: Secondary | ICD-10-CM | POA: Diagnosis not present

## 2021-08-22 DIAGNOSIS — F17200 Nicotine dependence, unspecified, uncomplicated: Secondary | ICD-10-CM

## 2021-08-22 DIAGNOSIS — M0630 Rheumatoid nodule, unspecified site: Secondary | ICD-10-CM

## 2021-08-22 DIAGNOSIS — M0579 Rheumatoid arthritis with rheumatoid factor of multiple sites without organ or systems involvement: Secondary | ICD-10-CM | POA: Diagnosis not present

## 2021-08-22 DIAGNOSIS — M62838 Other muscle spasm: Secondary | ICD-10-CM

## 2021-08-22 DIAGNOSIS — E041 Nontoxic single thyroid nodule: Secondary | ICD-10-CM | POA: Insufficient documentation

## 2021-08-22 DIAGNOSIS — M19072 Primary osteoarthritis, left ankle and foot: Secondary | ICD-10-CM

## 2021-08-22 DIAGNOSIS — M19071 Primary osteoarthritis, right ankle and foot: Secondary | ICD-10-CM

## 2021-08-22 DIAGNOSIS — M19041 Primary osteoarthritis, right hand: Secondary | ICD-10-CM

## 2021-08-22 DIAGNOSIS — R7611 Nonspecific reaction to tuberculin skin test without active tuberculosis: Secondary | ICD-10-CM

## 2021-08-22 DIAGNOSIS — M19042 Primary osteoarthritis, left hand: Secondary | ICD-10-CM

## 2021-08-22 DIAGNOSIS — L930 Discoid lupus erythematosus: Secondary | ICD-10-CM

## 2021-08-22 DIAGNOSIS — M170 Bilateral primary osteoarthritis of knee: Secondary | ICD-10-CM

## 2021-08-22 NOTE — Patient Instructions (Addendum)

## 2021-08-23 LAB — CYTOLOGY - NON PAP

## 2021-08-30 ENCOUNTER — Telehealth: Payer: Self-pay | Admitting: Pharmacist

## 2021-08-30 NOTE — Telephone Encounter (Signed)
Received fax from [facility] and CMM stating patient's Enbrel prior auth is set to expire. Submitted prior auth renewal to [facility] for Enbrel via CMM  Key: [id number]  Knox Saliva, PharmD, MPH, BCPS, CPP Clinical Pharmacist (Rheumatology and Pulmonology)

## 2021-10-18 DIAGNOSIS — H00015 Hordeolum externum left lower eyelid: Secondary | ICD-10-CM | POA: Diagnosis not present

## 2021-11-13 DIAGNOSIS — Z1231 Encounter for screening mammogram for malignant neoplasm of breast: Secondary | ICD-10-CM | POA: Diagnosis not present

## 2021-12-13 ENCOUNTER — Other Ambulatory Visit: Payer: Self-pay | Admitting: Rheumatology

## 2021-12-13 DIAGNOSIS — M0579 Rheumatoid arthritis with rheumatoid factor of multiple sites without organ or systems involvement: Secondary | ICD-10-CM

## 2021-12-13 NOTE — Telephone Encounter (Signed)
Next Visit: 02/26/2022  Last Visit: 08/22/2021  Last Fill: 03/29/2021  DX: Rheumatoid arthritis with rheumatoid factor of multiple sites without organ or systems involvement   Current Dose per office note 08/22/2021: Enbrel 50 mg every days injections once weekly   Labs: 08/10/2021 CBC and CMP are normal.  CXR 08/07/21: clear lungs  Called patient to let them know they are due for labs.  Okay to refill Enbrel?

## 2021-12-24 ENCOUNTER — Other Ambulatory Visit: Payer: Self-pay | Admitting: *Deleted

## 2021-12-24 DIAGNOSIS — Z79899 Other long term (current) drug therapy: Secondary | ICD-10-CM | POA: Diagnosis not present

## 2021-12-25 LAB — COMPLETE METABOLIC PANEL WITH GFR
AG Ratio: 1.3 (calc) (ref 1.0–2.5)
ALT: 13 U/L (ref 6–29)
AST: 18 U/L (ref 10–35)
Albumin: 4.1 g/dL (ref 3.6–5.1)
Alkaline phosphatase (APISO): 58 U/L (ref 37–153)
BUN: 18 mg/dL (ref 7–25)
CO2: 23 mmol/L (ref 20–32)
Calcium: 9.7 mg/dL (ref 8.6–10.4)
Chloride: 108 mmol/L (ref 98–110)
Creat: 0.86 mg/dL (ref 0.50–1.05)
Globulin: 3.1 g/dL (calc) (ref 1.9–3.7)
Glucose, Bld: 102 mg/dL — ABNORMAL HIGH (ref 65–99)
Potassium: 4.2 mmol/L (ref 3.5–5.3)
Sodium: 139 mmol/L (ref 135–146)
Total Bilirubin: 0.3 mg/dL (ref 0.2–1.2)
Total Protein: 7.2 g/dL (ref 6.1–8.1)
eGFR: 77 mL/min/{1.73_m2} (ref >=60)

## 2021-12-25 LAB — CBC WITH DIFFERENTIAL/PLATELET
Absolute Monocytes: 358 cells/uL (ref 200–950)
Basophils Absolute: 39 cells/uL (ref 0–200)
Basophils Relative: 0.8 %
Eosinophils Absolute: 328 cells/uL (ref 15–500)
Eosinophils Relative: 6.7 %
HCT: 38.5 % (ref 35.0–45.0)
Hemoglobin: 12.8 g/dL (ref 11.7–15.5)
Lymphs Abs: 2009 cells/uL (ref 850–3900)
MCH: 30.4 pg (ref 27.0–33.0)
MCHC: 33.2 g/dL (ref 32.0–36.0)
MCV: 91.4 fL (ref 80.0–100.0)
MPV: 11.4 fL (ref 7.5–12.5)
Monocytes Relative: 7.3 %
Neutro Abs: 2166 cells/uL (ref 1500–7800)
Neutrophils Relative %: 44.2 %
Platelets: 222 10*3/uL (ref 140–400)
RBC: 4.21 10*6/uL (ref 3.80–5.10)
RDW: 13.1 % (ref 11.0–15.0)
Total Lymphocyte: 41 %
WBC: 4.9 10*3/uL (ref 3.8–10.8)

## 2021-12-25 NOTE — Progress Notes (Signed)
CBC and CMP are normal.

## 2022-02-12 NOTE — Progress Notes (Signed)
Office Visit Note  Patient: Samantha Hughes             Date of Birth: 14-Aug-1960           MRN: [id number]             PCP: Kelton Pillar, MD Referring: Kelton Pillar, MD Visit Date: 02/26/2022 Occupation: '@GUAROCC'$ @  Subjective:  No chief complaint on file.   History of Present Illness: Samantha Hughes is a 61 y.o. female with history of seropositive rheumatoid arthritis and rheumatoid nodulosis.  She has been taking Enbrel 50 mg subcu every other week and methotrexate 0.8 mL subcu weekly without any side effects.  She has not had a rheumatoid arthritis flare since her last visit.  She has not had a flare of iritis.  She states she has been experiencing tingling and numbness in her left hand last 2 months.  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  She states she wakes up in the morning with tingling in her left hand.  He does not wake up in the middle of the night with pain.  She has been also getting massages which has helped her trapezius muscle spasm.  She has not noticed any joint swelling.  She has not had a flare of discoid lupus.  She had been using sunscreen on a regular basis.  Activities of Daily Living:  Patient reports morning stiffness for 0 minutes.   Patient Denies nocturnal pain.  Difficulty dressing/grooming: Denies Difficulty climbing stairs: Denies Difficulty getting out of chair: Denies Difficulty using hands for taps, buttons, cutlery, and/or writing: Denies  Review of Systems  Constitutional:  Negative for fatigue.  HENT:  Negative for mouth sores and mouth dryness.   Eyes:  Positive for dryness.  Respiratory:  Negative for shortness of breath.   Cardiovascular:  Negative for chest pain and palpitations.  Gastrointestinal:  Positive for constipation. Negative for blood in stool and diarrhea.  Endocrine: Negative for increased urination.  Genitourinary:  Negative for involuntary urination.  Musculoskeletal:  Negative for joint  pain, gait problem, joint pain, joint swelling, myalgias, muscle weakness, morning stiffness, muscle tenderness and myalgias.  Skin:  Negative for color change, rash, hair loss and sensitivity to sunlight.  Allergic/Immunologic: Negative for susceptible to infections.  Neurological:  Positive for numbness and parasthesias. Negative for dizziness and headaches.  Hematological:  Negative for swollen glands.  Psychiatric/Behavioral:  Negative for depressed mood and sleep disturbance. The patient is not nervous/anxious.     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   High risk medication use 02/06/2016   Positive PPD, treated 02/06/2016    Past Medical History:  Diagnosis Date   Depression    Osteoarthritis    Rheumatoid arthritis ([place])    Thyroid nodule     Family History  Problem Relation Age of Onset   Diabetes Mother    Hypertension Mother    Diabetes Sister    Diabetes Maternal Grandmother    Cancer Maternal Grandfather    Past Surgical History:  Procedure Laterality Date   eye lift  ingrown toenail Bilateral    bilateral great toes   TUBAL LIGATION     Social History   Social History Narrative   Not on file   Immunization History  Administered Date(s) Administered   Moderna Sars-Covid-2 Vaccination 05/17/2019, 06/15/2019, 03/02/2020, 12/08/2020     Objective: Vital Signs: BP 137/85 (BP Location: Left Arm, Patient Position: Sitting, Cuff Size: Normal)   Pulse 71   Resp 17   Ht '5\' 6"'$  (1.676 m)   Wt 173 lb 3.2 oz (78.6 kg)   BMI 27.96 kg/m    Physical  Exam Vitals and nursing note reviewed.  Constitutional:      Appearance: She is well-developed.  HENT:     Head: Normocephalic and atraumatic.  Eyes:     Conjunctiva/sclera: Conjunctivae normal.  Cardiovascular:     Rate and Rhythm: Normal rate and regular rhythm.     Heart sounds: Normal heart sounds.  Pulmonary:     Effort: Pulmonary effort is normal.     Breath sounds: Normal breath sounds.  Abdominal:     General: Bowel sounds are normal.     Palpations: Abdomen is soft.  Musculoskeletal:     Cervical back: Normal range of motion.  Lymphadenopathy:     Cervical: No cervical adenopathy.  Skin:    General: Skin is warm and dry.     Capillary Refill: Capillary refill takes less than 2 seconds.  Neurological:     Mental Status: She is alert and oriented to person, place, and time.  Psychiatric:        Behavior: Behavior normal.      Musculoskeletal Exam: Cervical and lumbar spine were in good range of motion.  Shoulder joints, elbow joints, wrist joints, MCPs PIPs and DIPs been good range of motion with no synovitis.  Phalen's,  Tinel's and manual compression test was negative.  Hip joints, knee joints, ankles, MTPs and PIPs with good range of motion with no synovitis.  CDAI Exam: CDAI Score: -- Patient Global: 0 mm; Provider Global: 0 mm Swollen: --; Tender: -- Joint Exam 02/26/2022   No joint exam has been documented for this visit   There is currently no information documented on the homunculus. Go to the Rheumatology activity and complete the homunculus joint exam.  Investigation: No additional findings.  Imaging: No results found.  Recent Labs: Lab Results  Component Value Date   WBC 4.9 12/24/2021   HGB 12.8 12/24/2021   PLT 222 12/24/2021   NA 139 12/24/2021   K 4.2 12/24/2021   CL 108 12/24/2021   CO2 23 12/24/2021   GLUCOSE 102 (H) 12/24/2021   BUN 18 12/24/2021   CREATININE 0.86 12/24/2021   BILITOT 0.3 12/24/2021   ALKPHOS 50 10/07/2016   AST  18 12/24/2021   ALT 13 12/24/2021   PROT 7.2 12/24/2021   ALBUMIN 4.2 10/07/2016   CALCIUM 9.7 12/24/2021   GFRAA 97 07/18/2020   QFTBGOLDPLUS POSITIVE (A) 02/12/2018    Speciality Comments: Humira (March 2022 to 10/09/20 - stopped d/t injection site reaction), changed to Enbrel on 10/25/20  Procedures:  No procedures performed Allergies: Arava [leflunomide], Plaquenil [hydroxychloroquine sulfate], and Humira [adalimumab]   Assessment / Plan:     Visit Diagnoses: Rheumatoid arthritis with rheumatoid factor of multiple sites without organ or systems involvement ([facility])-patient had no synovitis on my examination.  She states that she has not had a flare since February 2023.  She denies any history of recent joint swelling or joint pain.  She has been taking Enbrel and methotrexate on a regular basis without any interruption.  High risk medication use - Enbrel 50 mg subcutaneous injections once weekly, methotrexate 0.8 ml sq injections once weekly, folic acid 2 mg by mouth daily.  Labs obtained on December 24, 2021 CBC and CMP were normal.  Chest x-ray on May 19, 2020 was normal.  She will get annual chest x-ray as her TB Gold was positive in the past.  She was advised to get labs in January and every 3 months to monitor for drug toxicity.  Information about immunization was placed in the AVS.  She was also advised to use sunscreen.  Annual skin examination to screen for skin cancer was advised while she is on Enbrel.  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  No nodulosis was noted.  Positive PPD, treated - CXR 08/07/21: clear lungs.  Bilateral scleritis-she had no episodes of scleritis since the last visit.  Discoid lupus-she denies having any episodes of discoid lupus.  Trapezius muscle spasm-she has been good in massage on a regular basis which is helpful to relieve the trapezius muscle spasm.  Paresthesia in left hand-she has been experiencing numbness in her left hand for the last 2 months.   She is right-handed.  She states that she wakes up in the morning with numbness in her left hand.  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  Use of carpal tunnel brace was discussed.  A prescription was given.  If her symptoms persist we may consider a cortisone injection in the future.  Primary osteoarthritis of both hands-PIP and DIP thickening was noted.  She denies any discomfort.  Joint protection was discussed.  Primary osteoarthritis of both knees-currently not symptomatic.  Primary osteoarthritis of both feet-proper fitting shoes were advised.  Smoker-smoking cessation was discussed.  Association of smoking with rheumatoid arthritis was discussed.  Thyroid nodule - Thyroid ultrasound on 08/10/2021: Asymmetric markedly enlarged and heterogeneous appearance of left thyroid lobe.  Multiple underlying nodules are suspected.  Patient had a fine-needle biopsy on August 22, 2021.  Orders: No orders of the defined types were placed in this encounter.  No orders of the defined types were placed in this encounter.    Follow-Up Instructions: Return in about 5 months (around 07/28/2022) for Rheumatoid arthritis.   Bo Merino, MD  Note - This record has been created using Editor, commissioning.  Chart creation errors have been sought, but may not always  have been located. Such creation errors do not reflect on  the standard of medical care.

## 2022-02-14 ENCOUNTER — Other Ambulatory Visit: Payer: Self-pay | Admitting: Physician Assistant

## 2022-02-14 NOTE — Telephone Encounter (Signed)
Next Visit: 02/26/2022  Last Visit: 08/22/2021  Last Fill: 02/07/2021  DX: Rheumatoid arthritis with rheumatoid factor of multiple sites without organ or systems involvement   Current Dose per office note on 05/25/4097: folic acid 2 mg by mouth daily.    Okay to refill folic acid?

## 2022-02-26 ENCOUNTER — Ambulatory Visit: Payer: BC Managed Care – PPO | Attending: Rheumatology | Admitting: Rheumatology

## 2022-02-26 ENCOUNTER — Encounter: Payer: Self-pay | Admitting: Rheumatology

## 2022-02-26 VITALS — BP 137/85 | HR 71 | Resp 17 | Ht 66.0 in | Wt 173.2 lb

## 2022-02-26 DIAGNOSIS — M19071 Primary osteoarthritis, right ankle and foot: Secondary | ICD-10-CM

## 2022-02-26 DIAGNOSIS — Z79899 Other long term (current) drug therapy: Secondary | ICD-10-CM

## 2022-02-26 DIAGNOSIS — M0579 Rheumatoid arthritis with rheumatoid factor of multiple sites without organ or systems involvement: Secondary | ICD-10-CM | POA: Diagnosis not present

## 2022-02-26 DIAGNOSIS — R7611 Nonspecific reaction to tuberculin skin test without active tuberculosis: Secondary | ICD-10-CM | POA: Diagnosis not present

## 2022-02-26 DIAGNOSIS — R202 Paresthesia of skin: Secondary | ICD-10-CM

## 2022-02-26 DIAGNOSIS — M62838 Other muscle spasm: Secondary | ICD-10-CM

## 2022-02-26 DIAGNOSIS — M19072 Primary osteoarthritis, left ankle and foot: Secondary | ICD-10-CM

## 2022-02-26 DIAGNOSIS — H15003 Unspecified scleritis, bilateral: Secondary | ICD-10-CM

## 2022-02-26 DIAGNOSIS — M19041 Primary osteoarthritis, right hand: Secondary | ICD-10-CM

## 2022-02-26 DIAGNOSIS — M0630 Rheumatoid nodule, unspecified site: Secondary | ICD-10-CM

## 2022-02-26 DIAGNOSIS — E041 Nontoxic single thyroid nodule: Secondary | ICD-10-CM

## 2022-02-26 DIAGNOSIS — M19042 Primary osteoarthritis, left hand: Secondary | ICD-10-CM

## 2022-02-26 DIAGNOSIS — F17200 Nicotine dependence, unspecified, uncomplicated: Secondary | ICD-10-CM

## 2022-02-26 DIAGNOSIS — M170 Bilateral primary osteoarthritis of knee: Secondary | ICD-10-CM

## 2022-02-26 DIAGNOSIS — L930 Discoid lupus erythematosus: Secondary | ICD-10-CM

## 2022-02-26 NOTE — Patient Instructions (Signed)
Standing Labs We placed an order today for your standing lab work.   Please have your standing labs drawn in January and every 3 months  Please have your labs drawn 2 weeks prior to your appointment so that the provider can discuss your lab results at your appointment.  Please note that you may see your imaging and lab results in [place] before we have reviewed them. We will contact you once all results are reviewed. Please allow our office up to 72 hours to thoroughly review all of the results before contacting the office for clarification of your results.  Lab hours are:   Monday through Thursday from 8:00 am -12:30 pm and 1:00 pm-5:00 pm and Friday from 8:00 am-12:00 pm.  Please be advised, all patients with office appointments requiring lab work will take precedent over walk-in lab work.   Labs are drawn by [facility]. Please bring your co-pay at the time of your lab draw.  You may receive a bill from [facility] for your lab work.  Please note if you are on Hydroxychloroquine and and an order has been placed for a Hydroxychloroquine level, you will need to have it drawn 4 hours or more after your last dose.  If you wish to have your labs drawn at another location, please call the office 24 hours in advance so we can fax the orders.  The office is located at [address] [place], [place], [place] [address] No appointment is necessary.    If you have any questions regarding directions or hours of operation,  please call [phone].   As a reminder, please drink plenty of water prior to coming for your lab work. Thanks!   Vaccines You are taking a medication(s) that can suppress your immune system.  The following immunizations are recommended: Flu annually Covid-19  Td/Tdap (tetanus, diphtheria, pertussis) every 10 years Pneumonia (Prevnar 15 then Pneumovax 23 at least 1 year apart.  Alternatively, can take Prevnar 20 without needing additional dose) Shingrix: 2 doses from 4 weeks  to 6 months apart  Please check with your PCP to make sure you are up to date.   If you have signs or symptoms of an infection or start antibiotics: First, call your PCP for workup of your infection. Hold your medication through the infection, until you complete your antibiotics, and until symptoms resolve if you take the following: Injectable medication (Actemra, Benlysta, Cimzia, Cosentyx, Enbrel, Humira, Kevzara, Orencia, Remicade, Simponi, Stelara, Taltz, Tremfya) Methotrexate Leflunomide (Arava) Mycophenolate (Cellcept) Morrie Sheldon, Olumiant, or Rinvoq  Please get an annual skin examination to screen for skin cancer while you are on Enbrel.

## 2022-03-07 DIAGNOSIS — E041 Nontoxic single thyroid nodule: Secondary | ICD-10-CM | POA: Diagnosis not present

## 2022-03-07 DIAGNOSIS — Z124 Encounter for screening for malignant neoplasm of cervix: Secondary | ICD-10-CM | POA: Diagnosis not present

## 2022-03-07 DIAGNOSIS — E7800 Pure hypercholesterolemia, unspecified: Secondary | ICD-10-CM | POA: Diagnosis not present

## 2022-03-07 DIAGNOSIS — Z0000 Encounter for general adult medical examination without abnormal findings: Secondary | ICD-10-CM | POA: Diagnosis not present

## 2022-03-22 ENCOUNTER — Other Ambulatory Visit: Payer: Self-pay | Admitting: Internal Medicine

## 2022-03-22 DIAGNOSIS — Z122 Encounter for screening for malignant neoplasm of respiratory organs: Secondary | ICD-10-CM

## 2022-04-10 ENCOUNTER — Other Ambulatory Visit: Payer: Self-pay | Admitting: *Deleted

## 2022-04-10 DIAGNOSIS — Z79899 Other long term (current) drug therapy: Secondary | ICD-10-CM

## 2022-04-11 LAB — COMPLETE METABOLIC PANEL WITH GFR
AG Ratio: 1.4 (calc) (ref 1.0–2.5)
ALT: 10 U/L (ref 6–29)
AST: 17 U/L (ref 10–35)
Albumin: 4.1 g/dL (ref 3.6–5.1)
Alkaline phosphatase (APISO): 59 U/L (ref 37–153)
BUN: 12 mg/dL (ref 7–25)
CO2: 25 mmol/L (ref 20–32)
Calcium: 9.5 mg/dL (ref 8.6–10.4)
Chloride: 109 mmol/L (ref 98–110)
Creat: 0.86 mg/dL (ref 0.50–1.05)
Globulin: 2.9 g/dL (calc) (ref 1.9–3.7)
Glucose, Bld: 72 mg/dL (ref 65–99)
Potassium: 4.3 mmol/L (ref 3.5–5.3)
Sodium: 142 mmol/L (ref 135–146)
Total Bilirubin: 0.3 mg/dL (ref 0.2–1.2)
Total Protein: 7 g/dL (ref 6.1–8.1)
eGFR: 77 mL/min/{1.73_m2} (ref >=60)

## 2022-04-11 LAB — CBC WITH DIFFERENTIAL/PLATELET
Absolute Monocytes: 401 cells/uL (ref 200–950)
Basophils Absolute: 30 cells/uL (ref 0–200)
Basophils Relative: 0.5 %
Eosinophils Absolute: 419 cells/uL (ref 15–500)
Eosinophils Relative: 7.1 %
HCT: 35.6 % (ref 35.0–45.0)
Hemoglobin: 12.1 g/dL (ref 11.7–15.5)
Lymphs Abs: 1918 cells/uL (ref 850–3900)
MCH: 30.9 pg (ref 27.0–33.0)
MCHC: 34 g/dL (ref 32.0–36.0)
MCV: 91 fL (ref 80.0–100.0)
MPV: 11.3 fL (ref 7.5–12.5)
Monocytes Relative: 6.8 %
Neutro Abs: 3133 cells/uL (ref 1500–7800)
Neutrophils Relative %: 53.1 %
Platelets: 250 10*3/uL (ref 140–400)
RBC: 3.91 10*6/uL (ref 3.80–5.10)
RDW: 13.3 % (ref 11.0–15.0)
Total Lymphocyte: 32.5 %
WBC: 5.9 10*3/uL (ref 3.8–10.8)

## 2022-04-11 NOTE — Progress Notes (Signed)
CBC and CMP are normal.

## 2022-04-13 ENCOUNTER — Telehealth: Payer: Self-pay | Admitting: Rheumatology

## 2022-04-13 NOTE — Telephone Encounter (Signed)
I received a phone call from the patient that she developed upper respiratory tract infection symptoms yesterday.  Her test was positive for COVID-19 virus infection at home today.  I advised her to hold Enbrel and methotrexate.  I also advised her to contact her PCP for the treatment of COVID-19 virus infection.  She may resume Enbrel and methotrexate 2 weeks after she becomes symptom-free.  Patient voiced understanding. Bo Merino, MD

## 2022-04-25 ENCOUNTER — Ambulatory Visit
Admission: RE | Admit: 2022-04-25 | Discharge: 2022-04-25 | Disposition: A | Discharge status: Home / Self Care | Payer: BC Managed Care – PPO | Source: Ambulatory Visit | Attending: Internal Medicine | Admitting: Internal Medicine

## 2022-04-25 DIAGNOSIS — F17210 Nicotine dependence, cigarettes, uncomplicated: Secondary | ICD-10-CM | POA: Diagnosis not present

## 2022-04-25 DIAGNOSIS — Z122 Encounter for screening for malignant neoplasm of respiratory organs: Secondary | ICD-10-CM

## 2022-05-14 ENCOUNTER — Other Ambulatory Visit: Payer: Self-pay | Admitting: Physician Assistant

## 2022-05-14 NOTE — Telephone Encounter (Signed)
Next Visit: 07/29/2022  Last Visit: 02/26/2022  Last Fill: 07/02/2021  DX: Rheumatoid arthritis with rheumatoid factor of multiple sites without organ or systems involvement   Current Dose per office note 02/26/2022: methotrexate 0.8 ml sq injections once weekly   Labs: 04/10/2022 CBC and CMP are normal.   Okay to refill MTX?

## 2022-06-05 ENCOUNTER — Other Ambulatory Visit: Payer: Self-pay | Admitting: Rheumatology

## 2022-06-05 DIAGNOSIS — M0579 Rheumatoid arthritis with rheumatoid factor of multiple sites without organ or systems involvement: Secondary | ICD-10-CM

## 2022-06-05 NOTE — Telephone Encounter (Signed)
Last Fill: 12/13/2021  Labs: 04/10/2022 CBC and CMP are normal.   CXR 08/07/21: clear lungs   Next Visit: 07/29/2022   Last Visit: 02/26/2022  DX: Rheumatoid arthritis with rheumatoid factor of multiple sites without organ or systems involvement   Current Dose per office note 02/26/2022: Enbrel 50 mg subcutaneous injections once weekly   Okay to refill Enbrel?

## 2022-06-26 ENCOUNTER — Ambulatory Visit
Admission: RE | Admit: 2022-06-26 | Discharge: 2022-06-26 | Disposition: A | Discharge status: Home / Self Care | Payer: BC Managed Care – PPO | Source: Ambulatory Visit | Attending: Physician Assistant | Admitting: Physician Assistant

## 2022-06-26 ENCOUNTER — Other Ambulatory Visit: Payer: Self-pay | Admitting: Physician Assistant

## 2022-06-26 ENCOUNTER — Other Ambulatory Visit: Payer: Self-pay | Admitting: *Deleted

## 2022-06-26 DIAGNOSIS — R059 Cough, unspecified: Secondary | ICD-10-CM | POA: Diagnosis not present

## 2022-06-26 DIAGNOSIS — Z79899 Other long term (current) drug therapy: Secondary | ICD-10-CM

## 2022-06-26 DIAGNOSIS — J439 Emphysema, unspecified: Secondary | ICD-10-CM | POA: Diagnosis not present

## 2022-06-26 DIAGNOSIS — D849 Immunodeficiency, unspecified: Secondary | ICD-10-CM | POA: Diagnosis not present

## 2022-06-26 DIAGNOSIS — M069 Rheumatoid arthritis, unspecified: Secondary | ICD-10-CM | POA: Diagnosis not present

## 2022-06-27 LAB — COMPLETE METABOLIC PANEL WITH GFR
AG Ratio: 1.5 (calc) (ref 1.0–2.5)
ALT: 9 U/L (ref 6–29)
AST: 15 U/L (ref 10–35)
Albumin: 4 g/dL (ref 3.6–5.1)
Alkaline phosphatase (APISO): 50 U/L (ref 37–153)
BUN: 20 mg/dL (ref 7–25)
CO2: 26 mmol/L (ref 20–32)
Calcium: 9.8 mg/dL (ref 8.6–10.4)
Chloride: 108 mmol/L (ref 98–110)
Creat: 0.82 mg/dL (ref 0.50–1.05)
Globulin: 2.7 g/dL (calc) (ref 1.9–3.7)
Glucose, Bld: 81 mg/dL (ref 65–99)
Potassium: 4.5 mmol/L (ref 3.5–5.3)
Sodium: 141 mmol/L (ref 135–146)
Total Bilirubin: 0.3 mg/dL (ref 0.2–1.2)
Total Protein: 6.7 g/dL (ref 6.1–8.1)
eGFR: 82 mL/min/{1.73_m2} (ref >=60)

## 2022-06-27 LAB — CBC WITH DIFFERENTIAL/PLATELET
Absolute Monocytes: 480 cells/uL (ref 200–950)
Basophils Absolute: 30 cells/uL (ref 0–200)
Basophils Relative: 0.5 %
Eosinophils Absolute: 360 cells/uL (ref 15–500)
Eosinophils Relative: 6 %
HCT: 34.4 % — ABNORMAL LOW (ref 35.0–45.0)
Hemoglobin: 11.4 g/dL — ABNORMAL LOW (ref 11.7–15.5)
Lymphs Abs: 2256 cells/uL (ref 850–3900)
MCH: 30.6 pg (ref 27.0–33.0)
MCHC: 33.1 g/dL (ref 32.0–36.0)
MCV: 92.2 fL (ref 80.0–100.0)
MPV: 11 fL (ref 7.5–12.5)
Monocytes Relative: 8 %
Neutro Abs: 2874 cells/uL (ref 1500–7800)
Neutrophils Relative %: 47.9 %
Platelets: 241 10*3/uL (ref 140–400)
RBC: 3.73 10*6/uL — ABNORMAL LOW (ref 3.80–5.10)
RDW: 13.2 % (ref 11.0–15.0)
Total Lymphocyte: 37.6 %
WBC: 6 10*3/uL (ref 3.8–10.8)

## 2022-06-27 NOTE — Progress Notes (Signed)
Hemoglobin is low.  Patient should take multivitamin with iron.  CMP is normal.

## 2022-07-08 DIAGNOSIS — H00014 Hordeolum externum left upper eyelid: Secondary | ICD-10-CM | POA: Diagnosis not present

## 2022-07-15 NOTE — Progress Notes (Signed)
Office Visit Note  Patient: Samantha Hughes             Date of Birth: [birth year]           MRN: [id number]             PCP: Maurice Small, MD Referring: Maurice Small, MD Visit Date: 07/29/2022 Occupation: @GUAROCC @  Subjective:  Medication monitoring   History of Present Illness: Samantha Hughes is a 62 y.o. female with history of seropositive rheumatoid arthritis and osteoarthritis.  Patient remains on  Enbrel 50 mg subcutaneous injections once every other week, methotrexate 0.8 ml sq injections once weekly, folic acid 2 mg by mouth daily.  She is tolerating combination therapy without any side effects.  She denies any recent or recurrent infections.  She has not noticed any signs or symptoms of a flare since spacing the dosing of Enbrel.  Her morning stiffness is only lasting for about 5 minutes daily.  She has not had any nocturnal pain.  She denies any joint swelling at this time. She denies any new discoid lesions.  She denies any signs or symptoms of a scleritis flare.  She denies any rheumatoid nodules at this time.   Activities of Daily Living:  Patient reports morning stiffness for 5 minutes.   Patient Denies nocturnal pain.  Difficulty dressing/grooming: Denies Difficulty climbing stairs: Denies Difficulty getting out of chair: Denies Difficulty using hands for taps, buttons, cutlery, and/or writing: Denies  Review of Systems  Constitutional:  Positive for fatigue.  HENT:  Negative for mouth sores and mouth dryness.   Eyes:  Negative for dryness.  Respiratory:  Negative for shortness of breath.   Cardiovascular:  Negative for chest pain and palpitations.  Gastrointestinal:  Positive for constipation. Negative for blood in stool and diarrhea.  Endocrine: Negative for increased urination.  Genitourinary:  Negative for involuntary urination.  Musculoskeletal:  Positive for morning stiffness. Negative for joint pain, gait problem, joint pain, joint swelling, myalgias,  muscle weakness, muscle tenderness and myalgias.  Skin:  Negative for color change, rash, hair loss and sensitivity to sunlight.  Allergic/Immunologic: Positive for susceptible to infections.  Neurological:  Negative for dizziness and headaches.  Hematological:  Negative for swollen glands.  Psychiatric/Behavioral:  Negative for depressed mood and sleep disturbance. The patient is not nervous/anxious.     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   High risk medication use 02/06/2016   Positive PPD, treated 02/06/2016    Past Medical History:  Diagnosis Date   Depression    Osteoarthritis    Rheumatoid arthritis (HCC)    Thyroid nodule     Family History  Problem Relation Age of Onset   Diabetes Mother    Hypertension Mother    Diabetes Sister    Diabetes Maternal Grandmother    Cancer Maternal Grandfather    Past Surgical History:  Procedure Laterality Date   eye lift     ingrown toenail Bilateral    bilateral great toes   TUBAL LIGATION     Social History   Social History Narrative   Not on file  Immunization History  Administered Date(s) Administered   [employer] Vaccination 05/17/2019, 06/15/2019, 03/02/2020, 12/08/2020     Objective: Vital Signs: BP 127/84 (BP Location: Left Arm, Patient Position: Sitting, Cuff Size: Normal)   Pulse 73   Resp 14   Ht 5\' 6"  (1.676 m)   Wt 173 lb 6.4 oz (78.7 kg)   BMI 27.99 kg/m    Physical Exam Vitals and nursing note reviewed.  Constitutional:      Appearance: She is well-developed.  HENT:     Head:  Normocephalic and atraumatic.  Eyes:     Conjunctiva/sclera: Conjunctivae normal.  Cardiovascular:     Rate and Rhythm: Normal rate and regular rhythm.     Heart sounds: Normal heart sounds.  Pulmonary:     Effort: Pulmonary effort is normal.     Breath sounds: Normal breath sounds.  Abdominal:     General: Bowel sounds are normal.     Palpations: Abdomen is soft.  Musculoskeletal:     Cervical back: Normal range of motion.  Lymphadenopathy:     Cervical: No cervical adenopathy.  Skin:    General: Skin is warm and dry.     Capillary Refill: Capillary refill takes less than 2 seconds.  Neurological:     Mental Status: She is alert and oriented to person, place, and time.  Psychiatric:        Behavior: Behavior normal.      Musculoskeletal Exam: C-spine, thoracic spine, lumbar spine have good range of motion.  Shoulder joints, elbow joints, wrist joints, MCPs, PIPs, DIPs have good range of motion with no synovitis.  Complete fist formation bilaterally.  Hip joints have good range of motion with no groin pain.  Knee joints have good range of motion with no warmth or effusion.  Ankle joints have good range of motion without tenderness or joint swelling.  CDAI Exam: CDAI Score: -- Patient Global: 1 mm; Provider Global: 1 mm Swollen: --; Tender: -- Joint Exam 07/29/2022   No joint exam has been documented for this visit   There is currently no information documented on the homunculus. Go to the Rheumatology activity and complete the homunculus joint exam.  Investigation: No additional findings.  Imaging: No results found.  Recent Labs: Lab Results  Component Value Date   WBC 6.0 06/26/2022   HGB 11.4 (L) 06/26/2022   PLT 241 06/26/2022   NA 141 06/26/2022   K 4.5 06/26/2022   CL 108 06/26/2022   CO2 26 06/26/2022   GLUCOSE 81 06/26/2022   BUN 20 06/26/2022   CREATININE 0.82 06/26/2022   BILITOT 0.3 06/26/2022   ALKPHOS 50 10/07/2016   AST 15 06/26/2022   ALT 9  06/26/2022   PROT 6.7 06/26/2022   ALBUMIN 4.2 10/07/2016   CALCIUM 9.8 06/26/2022   GFRAA 97 07/18/2020   QFTBGOLDPLUS POSITIVE (A) 02/12/2018    Speciality Comments: Humira (March 2022 to 10/09/20 - stopped d/t injection site reaction), changed to Enbrel on 10/25/20  Procedures:  No procedures performed Allergies: Arava [leflunomide], Plaquenil [hydroxychloroquine sulfate], and Humira [adalimumab]   Assessment / Plan:     Visit Diagnoses: Rheumatoid arthritis with rheumatoid factor of multiple sites without organ or systems involvement [facility]): No joint tenderness or synovitis was noted on examination today.  She has not had any signs or symptoms of a rheumatoid arthritis flare.  She has clinically been doing well on Enbrel 50 mg subcu injections every other week.  She also remains on methotrexate 0.8 mL  subcu injections once weekly along with folic acid 2 mg daily.  She started spacing the dosing of Enbrel to every other week after her last office visit in December 2023.  She has not noticed any new or worsening symptoms since spacing the dosing of Enbrel.  She has not had any signs or symptoms of a flare.  No signs or symptoms of scleritis.  Her morning stiffness is only been lasting 5 minutes daily.  She has not had any difficulty with ADLs or nocturnal pain.  She will continue to space Enbrel to 50 mg every other week and remain on methotrexate as prescribed.  She will follow-up in the office in 5 months or sooner if needed.  Association of heart disease with rheumatoid arthritis was discussed. Need to monitor blood pressure, cholesterol, and to exercise 30-60 minutes on daily basis was discussed.  High risk medication use - Enbrel 50 mg subcutaneous injections every other week, methotrexate 0.8 ml sq injections once weekly, folic acid 2 mg by mouth daily. CBC and CMP updated on 06/26/22.  Plan to update CBC with differential and iron panel today.  She started to take an iron supplement on a  daily basis due to anemia noted on last lab work.  Her next lab work will be due in July and every 3 months to monitor for drug toxicity. CXR 06/26/22: No acute abnormality or explanation for cough. Emphysematous changes on prior CT are not radiographically evident. No recent or recurrent infections.  Discussed the importance of holding enbrel and methotrexate if she develops signs or symptoms of an infection and to resume once the infection has completely cleared.  She is scheduled to update her yearly skin examination with dermatology.  Rheumatoid nodulosis (HCC): No rheumatoid nodules noted at this time.  Positive PPD, treated: Chest x-ray updated on 06/26/2022.  Bilateral scleritis: No signs or symptoms of a scleritis flare.  No conjunctival injection noted.   Discoid lupus: No recurrence.  Discussed the importance of avoiding direct sun exposure.  Trapezius muscle spasm: She is not currently experiencing any muscle spasms.  Primary osteoarthritis of both hands: No tenderness or inflammation noted.  Complete fist formation bilaterally.  Paresthesias in left hand: Asymptomatic at this time.  Primary osteoarthritis of both knees: Good range of motion of both knee joints on examination today.  No warmth or effusion noted.  Primary osteoarthritis of both feet: She is not experiencing any discomfort in her feet at this time.  She has good range of motion of both ankle joints with no tenderness or joint swelling.  She is wearing proper fitting shoes.  Other medical conditions are listed as follows:  Smoker  Thyroid nodule - Thyroid u/s 08/10/2021: Asymmetric markedly enlarged and heterogeneous of left thyroid lobe.  Multiple underlying nodules suspected.  fine-needle biopsy 08/22/21  History of anemia: Patient has started to take an iron supplement daily.  Plan on updating CBC with differential and iron panel today.  Orders: Orders Placed This Encounter  Procedures   Fe+TIBC+Fer   CBC  with Differential/Platelet   No orders of the defined types were placed in this encounter.   Follow-Up Instructions: Return in about 5 months (around 12/29/2022) for Rheumatoid arthritis, Osteoarthritis.   Gearldine Bienenstock, PA-C  Note - This record has been created using Dragon software.  Chart creation errors have been sought, but may not always  have been located. Such creation errors do not reflect on  the standard of medical care.

## 2022-07-22 ENCOUNTER — Other Ambulatory Visit: Payer: Self-pay | Admitting: Rheumatology

## 2022-07-23 NOTE — Telephone Encounter (Signed)
Last Fill: 03/18/2021  Next Visit: 07/29/2022  Last Visit: 02/26/2022  DX: Rheumatoid arthritis with rheumatoid factor of multiple sites without organ or systems involvement   Current Dose per office note on 02/26/2022: methotrexate 0.8 ml sq injections once weekly   Okay to refill MTX syringes?

## 2022-07-29 ENCOUNTER — Ambulatory Visit: Payer: BC Managed Care – PPO | Attending: Physician Assistant | Admitting: Physician Assistant

## 2022-07-29 ENCOUNTER — Encounter: Payer: Self-pay | Admitting: Physician Assistant

## 2022-07-29 VITALS — BP 127/84 | HR 73 | Resp 14 | Ht 66.0 in | Wt 173.4 lb

## 2022-07-29 DIAGNOSIS — M170 Bilateral primary osteoarthritis of knee: Secondary | ICD-10-CM

## 2022-07-29 DIAGNOSIS — M0630 Rheumatoid nodule, unspecified site: Secondary | ICD-10-CM | POA: Diagnosis not present

## 2022-07-29 DIAGNOSIS — R202 Paresthesia of skin: Secondary | ICD-10-CM

## 2022-07-29 DIAGNOSIS — H15003 Unspecified scleritis, bilateral: Secondary | ICD-10-CM

## 2022-07-29 DIAGNOSIS — R7611 Nonspecific reaction to tuberculin skin test without active tuberculosis: Secondary | ICD-10-CM | POA: Diagnosis not present

## 2022-07-29 DIAGNOSIS — M19072 Primary osteoarthritis, left ankle and foot: Secondary | ICD-10-CM

## 2022-07-29 DIAGNOSIS — M19041 Primary osteoarthritis, right hand: Secondary | ICD-10-CM

## 2022-07-29 DIAGNOSIS — F17200 Nicotine dependence, unspecified, uncomplicated: Secondary | ICD-10-CM

## 2022-07-29 DIAGNOSIS — Z862 Personal history of diseases of the blood and blood-forming organs and certain disorders involving the immune mechanism: Secondary | ICD-10-CM | POA: Diagnosis not present

## 2022-07-29 DIAGNOSIS — Z79899 Other long term (current) drug therapy: Secondary | ICD-10-CM

## 2022-07-29 DIAGNOSIS — M19042 Primary osteoarthritis, left hand: Secondary | ICD-10-CM

## 2022-07-29 DIAGNOSIS — E041 Nontoxic single thyroid nodule: Secondary | ICD-10-CM

## 2022-07-29 DIAGNOSIS — M19071 Primary osteoarthritis, right ankle and foot: Secondary | ICD-10-CM

## 2022-07-29 DIAGNOSIS — M0579 Rheumatoid arthritis with rheumatoid factor of multiple sites without organ or systems involvement: Secondary | ICD-10-CM

## 2022-07-29 DIAGNOSIS — M62838 Other muscle spasm: Secondary | ICD-10-CM

## 2022-07-29 DIAGNOSIS — L930 Discoid lupus erythematosus: Secondary | ICD-10-CM

## 2022-07-29 NOTE — Patient Instructions (Signed)
Standing Labs We placed an order today for your standing lab work.   Please have your standing labs drawn in July and every 3 months   Please have your labs drawn 2 weeks prior to your appointment so that the provider can discuss your lab results at your appointment, if possible.  Please note that you may see your imaging and lab results in MyChart before we have reviewed them. We will contact you once all results are reviewed. Please allow our office up to 72 hours to thoroughly review all of the results before contacting the office for clarification of your results.  WALK-IN LAB HOURS  Monday through Thursday from 8:00 am -12:30 pm and 1:00 pm-5:00 pm and Friday from 8:00 am-12:00 pm.  Patients with office visits requiring labs will be seen before walk-in labs.  You may encounter longer than normal wait times. Please allow additional time. Wait times may be shorter on  Monday and Thursday afternoons.  We do not book appointments for walk-in labs. We appreciate your patience and understanding with our staff.   Labs are drawn by [facility]. Please bring your co-pay at the time of your lab draw.  You may receive a bill from [facility] for your lab work.  Please note if you are on Hydroxychloroquine and and an order has been placed for a Hydroxychloroquine level,  you will need to have it drawn 4 hours or more after your last dose.  If you wish to have your labs drawn at another location, please call the office 24 hours in advance so we can fax the orders.  The office is located at [address], [address], , [place] [address]   If you have any questions regarding directions or hours of operation,  please call [phone].   As a reminder, please drink plenty of water prior to coming for your lab work. Thanks!  

## 2022-07-30 LAB — IRON,TIBC AND FERRITIN PANEL
%SAT: 24 % (calc) (ref 16–45)
Ferritin: 50 ng/mL (ref 16–232)
Iron: 60 ug/dL (ref 45–160)
TIBC: 255 mcg/dL (calc) (ref 250–450)

## 2022-07-30 LAB — CBC WITH DIFFERENTIAL/PLATELET
Absolute Monocytes: 389 cells/uL (ref 200–950)
Basophils Absolute: 38 cells/uL (ref 0–200)
Basophils Relative: 0.7 %
Eosinophils Absolute: 211 cells/uL (ref 15–500)
Eosinophils Relative: 3.9 %
HCT: 36.2 % (ref 35.0–45.0)
Hemoglobin: 11.9 g/dL (ref 11.7–15.5)
Lymphs Abs: 2419 cells/uL (ref 850–3900)
MCH: 30.1 pg (ref 27.0–33.0)
MCHC: 32.9 g/dL (ref 32.0–36.0)
MCV: 91.6 fL (ref 80.0–100.0)
MPV: 11.4 fL (ref 7.5–12.5)
Monocytes Relative: 7.2 %
Neutro Abs: 2344 cells/uL (ref 1500–7800)
Neutrophils Relative %: 43.4 %
Platelets: 213 10*3/uL (ref 140–400)
RBC: 3.95 10*6/uL (ref 3.80–5.10)
RDW: 13.1 % (ref 11.0–15.0)
Total Lymphocyte: 44.8 %
WBC: 5.4 10*3/uL (ref 3.8–10.8)

## 2022-07-30 NOTE — Progress Notes (Signed)
CBC WNL.  Anemia has improved. Iron WNL.  Ok to reduce frequency of iron supplementation due to constipation/GI upset.

## 2022-08-02 ENCOUNTER — Telehealth: Payer: Self-pay | Admitting: Pharmacist

## 2022-08-02 NOTE — Telephone Encounter (Signed)
Submitted a Prior Authorization renewal request to [facility] for ENBREL via CoverMyMeds. Will update once we receive a response.  Key: [id number]  Chesley Mires, PharmD, MPH, BCPS, CPP Clinical Pharmacist (Rheumatology and Pulmonology)

## 2022-08-13 NOTE — Telephone Encounter (Signed)
Received notification from [facility] regarding a prior authorization for ENBREL. Authorization has been APPROVED from 08/02/22 to 08/02/23. Approval letter sent to scan center.  Patient must continue to fill through [facility]: [phone]   Authorization # [id number] Phone # [phone]

## 2022-09-25 DIAGNOSIS — M542 Cervicalgia: Secondary | ICD-10-CM | POA: Diagnosis not present

## 2022-09-25 DIAGNOSIS — M26609 Unspecified temporomandibular joint disorder, unspecified side: Secondary | ICD-10-CM | POA: Diagnosis not present

## 2022-10-09 DIAGNOSIS — M542 Cervicalgia: Secondary | ICD-10-CM | POA: Diagnosis not present

## 2022-10-19 ENCOUNTER — Other Ambulatory Visit: Payer: Self-pay | Admitting: Rheumatology

## 2022-10-21 NOTE — Telephone Encounter (Signed)
Last Fill: 05/14/2022  Labs: 07/29/2022 CBC WNL. Anemia has improved. Iron WNL 06/26/2022 CMP is normal.   Next Visit: 12/31/2022  Last Visit: 07/29/2022  DX: Rheumatoid arthritis with rheumatoid factor of multiple sites without organ or systems involvement   Current Dose per office note 07/29/2022: methotrexate 0.8 ml sq injections once weekly   Patient advised she due to update labs. Patient will come to office to update next week.   Okay to refill Methotrexate?

## 2022-10-30 ENCOUNTER — Telehealth: Payer: Self-pay | Admitting: *Deleted

## 2022-10-30 NOTE — Telephone Encounter (Signed)
Received fax from [facility] stating they have been trying to reach patient to refill Enbrel Mini. They have been unsuccessful in reaching patient. Call back number is [phone]. Left message to advise patient to contact the pharmacy to set up shipment.

## 2022-11-06 ENCOUNTER — Other Ambulatory Visit: Payer: Self-pay | Admitting: *Deleted

## 2022-11-06 DIAGNOSIS — Z79899 Other long term (current) drug therapy: Secondary | ICD-10-CM

## 2022-11-07 LAB — CBC WITH DIFFERENTIAL/PLATELET
Absolute Monocytes: 346 {cells}/uL (ref 200–950)
Basophils Absolute: 30 {cells}/uL (ref 0–200)
Basophils Relative: 0.8 %
Eosinophils Absolute: 209 cells/uL (ref 15–500)
Eosinophils Relative: 5.5 %
HCT: 36.3 % (ref 35.0–45.0)
Hemoglobin: 11.9 g/dL (ref 11.7–15.5)
Lymphs Abs: 1596 {cells}/uL (ref 850–3900)
MCH: 30.6 pg (ref 27.0–33.0)
MCHC: 32.8 g/dL (ref 32.0–36.0)
MCV: 93.3 fL (ref 80.0–100.0)
MPV: 10.8 fL (ref 7.5–12.5)
Monocytes Relative: 9.1 %
Neutro Abs: 1619 {cells}/uL (ref 1500–7800)
Neutrophils Relative %: 42.6 %
Platelets: 268 10*3/uL (ref 140–400)
RBC: 3.89 10*6/uL (ref 3.80–5.10)
RDW: 13.6 % (ref 11.0–15.0)
Total Lymphocyte: 42 %
WBC: 3.8 10*3/uL (ref 3.8–10.8)

## 2022-11-07 LAB — COMPLETE METABOLIC PANEL WITH GFR
AG Ratio: 1.4 (calc) (ref 1.0–2.5)
ALT: 10 U/L (ref 6–29)
AST: 13 U/L (ref 10–35)
Albumin: 3.9 g/dL (ref 3.6–5.1)
Alkaline phosphatase (APISO): 47 U/L (ref 37–153)
BUN: 14 mg/dL (ref 7–25)
CO2: 26 mmol/L (ref 20–32)
Calcium: 9.9 mg/dL (ref 8.6–10.4)
Chloride: 108 mmol/L (ref 98–110)
Creat: 0.8 mg/dL (ref 0.50–1.05)
Globulin: 2.7 g/dL (calc) (ref 1.9–3.7)
Glucose, Bld: 89 mg/dL (ref 65–99)
Potassium: 4.4 mmol/L (ref 3.5–5.3)
Sodium: 141 mmol/L (ref 135–146)
Total Bilirubin: 0.3 mg/dL (ref 0.2–1.2)
Total Protein: 6.6 g/dL (ref 6.1–8.1)
eGFR: 84 mL/min/{1.73_m2} (ref >=60)

## 2022-11-07 NOTE — Progress Notes (Signed)
CBC and CMP are normal.

## 2022-12-17 NOTE — Progress Notes (Unsigned)
Office Visit Note  Patient: Samantha Hughes             Date of Birth: 1960/12/08           MRN: [id number]             PCP: Maurice Small, MD (Inactive) Referring: Maurice Small, MD Visit Date: 12/31/2022 Occupation: @GUAROCC @  Subjective:  Neck pain   History of Present Illness: Samantha Hughes is a 62 y.o. female with history of seronegative rheumatoid arthritis.  Patient remains on methotrexate 0.8 ml sq injections once weekly and folic acid 2 mg by mouth daily.  She continues to tolerate methotrexate without any side effects or injection site reactions.  Patient states that she has been off of Enbrel for the past 1 month due to a change in insurance.  Patient states that she does not currently have insurance but will be getting insurance as of 02/09/2023.  She has not noticed any signs or symptoms of a flare during the gap in therapy.  Patient states that since the end of July she has had increased neck pain.  She states that she is having left-sided neck pain and stiffness and at times has a popping sensation which is uncomfortable.  She denies any numbness or tingling down the left arm currently.  She has tried Voltaren gel as well as getting massage on a monthly basis.  She has not noticed any improvement in her symptoms as time is gone on.  She denies any other joint pain or joint swelling at this time. She denies any recent or recurrent infections.  Activities of Daily Living:  Patient reports morning stiffness for a few minutes.   Patient Denies nocturnal pain.  Difficulty dressing/grooming: Denies Difficulty climbing stairs: Denies Difficulty getting out of chair: Denies Difficulty using hands for taps, buttons, cutlery, and/or writing: Denies  Review of Systems  Constitutional:  Negative for fatigue.  HENT:  Negative for mouth sores and mouth dryness.   Eyes:  Negative for dryness.  Respiratory:  Negative for cough, shortness of breath and wheezing.   Cardiovascular:   Negative for chest pain and palpitations.  Gastrointestinal:  Positive for constipation. Negative for blood in stool and diarrhea.  Endocrine: Negative for increased urination.  Genitourinary:  Negative for involuntary urination.  Musculoskeletal:  Positive for joint pain, joint pain, myalgias and myalgias. Negative for gait problem, joint swelling, muscle weakness, morning stiffness and muscle tenderness.  Skin:  Negative for color change, rash, hair loss and sensitivity to sunlight.  Allergic/Immunologic: Positive for susceptible to infections.  Neurological:  Negative for dizziness and headaches.  Hematological:  Negative for swollen glands.  Psychiatric/Behavioral:  Negative for depressed mood and sleep disturbance. The patient is not nervous/anxious.     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   High risk medication use 02/06/2016   Positive PPD, treated 02/06/2016    Past Medical History:  Diagnosis Date   Depression    Osteoarthritis    Rheumatoid arthritis (HCC)    Thyroid nodule     Family History  Problem Relation Age of Onset  Diabetes Mother    Hypertension Mother    Diabetes Sister    Diabetes Maternal Grandmother    Cancer Maternal Grandfather    Past Surgical History:  Procedure Laterality Date   eye lift     ingrown toenail Bilateral    bilateral great toes   TUBAL LIGATION     Social History   Social History Narrative   Not on file   Immunization History  Administered Date(s) Administered   Moderna Sars-Covid-2 Vaccination 05/17/2019, 06/15/2019,  03/02/2020, 12/08/2020     Objective: Vital Signs: BP 126/87 (BP Location: Left Arm, Patient Position: Sitting, Cuff Size: Normal)   Pulse 92   Resp 14   Ht 5\' 6"  (1.676 m)   Wt 179 lb 6.4 oz (81.4 kg)   BMI 28.96 kg/m    Physical Exam Vitals and nursing note reviewed.  Constitutional:      Appearance: She is well-developed.  HENT:     Head: Normocephalic and atraumatic.  Eyes:     Conjunctiva/sclera: Conjunctivae normal.  Cardiovascular:     Rate and Rhythm: Normal rate and regular rhythm.     Heart sounds: Normal heart sounds.  Pulmonary:     Effort: Pulmonary effort is normal.     Breath sounds: Normal breath sounds.  Abdominal:     General: Bowel sounds are normal.     Palpations: Abdomen is soft.  Musculoskeletal:     Cervical back: Normal range of motion.  Lymphadenopathy:     Cervical: No cervical adenopathy.  Skin:    General: Skin is warm and dry.     Capillary Refill: Capillary refill takes less than 2 seconds.  Neurological:     Mental Status: She is alert and oriented to person, place, and time.  Psychiatric:        Behavior: Behavior normal.      Musculoskeletal Exam: C-spine has limited range of motion without rotation especially to the left.  Left trapezius muscle tension and tenderness noted.  No midline spinal tenderness noted.  Shoulder joints, elbow joints, wrist joints, MCPs, PIPs, DIPs have good range of motion with no synovitis.  Synovial thickening over the right second MCP joint.  PIP and DIP thickening noted.  Complete fist formation bilaterally.  Hip joints have good range of motion with no groin pain.  Knee joints have good range of motion with no warmth or effusion.  Ankle joints have good range of motion with no tenderness or joint swelling.  CDAI Exam: CDAI Score: 0  Patient Global: 0 / 100; Provider Global: 0 / 100 Swollen: 0 ; Tender: 1  Joint Exam 12/31/2022      Right  Left  Cervical Spine   Tender        Investigation: No  additional findings.  Imaging: No results found.  Recent Labs: Lab Results  Component Value Date   WBC 3.8 11/06/2022   HGB 11.9 11/06/2022   PLT 268 11/06/2022   NA 141 11/06/2022   K 4.4 11/06/2022   CL 108 11/06/2022   CO2 26 11/06/2022   GLUCOSE 89 11/06/2022   BUN 14 11/06/2022   CREATININE 0.80 11/06/2022   BILITOT 0.3 11/06/2022   ALKPHOS 50 10/07/2016   AST 13 11/06/2022   ALT 10 11/06/2022   PROT 6.6 11/06/2022   ALBUMIN 4.2 10/07/2016   CALCIUM 9.9 11/06/2022   GFRAA 97 07/18/2020   QFTBGOLDPLUS POSITIVE (A) 02/12/2018    Speciality Comments: Humira (March 2022 to 10/09/20 - stopped d/t  injection site reaction), changed to Enbrel on 10/25/20  Procedures:  No procedures performed Allergies: Arava [leflunomide], Plaquenil [hydroxychloroquine sulfate], and Humira [adalimumab]     Assessment / Plan:     Visit Diagnoses: Rheumatoid arthritis with rheumatoid factor of multiple sites without organ or systems involvement [facility]): She has no synovitis on examination today.  She has not had any signs or symptoms of a rheumatoid arthritis flare.  She has not had any signs or symptoms of a scleritis flare.  She has clinically been doing well on methotrexate 0.8 mL sq injections once weekly and folic acid 2 mg daily.  She has been holding Enbrel for the past 1 month due to not currently having insurance.  She has not noticed any new or worsening symptoms during the gap in therapy.  Her new insurance will take effect on 02/09/2023.  She was advised to notify us if she develops any signs or symptoms of a flare during the interruption in therapy.  She will remain on methotrexate and folic acid as prescribed.  She will follow-up in the office in 3 months or sooner if needed.  High risk medication use - Methotrexate 0.8 ml sq injections once weekly, folic acid 2 mg by mouth daily. She has been holding Enbrel for the past 1 month due to not currently having insurance.  Her new insurance  will start on 02/09/2023. CBC and CMP WNL on 11/06/22.  Her next lab work will be due in November and every 3 months.  CXR no acute abnormality on 06/26/22.  Discussed the importance of holding enbrel and methotrexate if she develops signs or symptoms of an infection and to resume once the infection has completely cleared.   Rheumatoid nodulosis (HCC): No nodules noted today.   Positive PPD, treated: Chest x-ray did not reveal any acute abnormality on 06/26/2022.  Bilateral scleritis: She has not had any signs or symptoms of a scleritis flare.  She will remain on methotrexate as prescribed.  Discoid lupus: No recurrence.  Neck pain: Patient's been experiencing increased neck pain and stiffness since the end of July 2024.  She attributes the discomfort in her neck to having to wear a backpack for work on a daily basis in the past.  She has not had any recent injury.  She has been getting monthly massages and has been applying Voltaren gel topically for pain relief.  She has not noticed any improvement in her symptoms over time but is also not had any progression.  She has been experiencing a popping sensation with range of motion which has been uncomfortable at times. On examination she has limited range of motion with rotation especially to the left.  Left trapezius muscle tension and tenderness noted.  She has good flexion and extension of the C-spine. Patient does not currently have insurance so she would like to hold off on having a x-ray at this time. Conservative treatment options were discussed including performing active range of motion exercises.  Also discussed using a heating pad, massage, and pain patches.  Handout of exercises were provided to the patient. To help alleviate some of the muscle tension and muscle spasms she has been experiencing a prescription for methocarbamol 500 mg 1 tablet daily as needed sent to the pharmacy.  Trapezius muscle spasm: She has been experiencing left-sided  trapezius muscle spasms.  Muscle tension and tenderness noted on examination today.  She is getting massages on a monthly basis and has been applying Voltaren gel topically.  A  prescription for methocarbamol 500 mg 1 tablet daily as needed for muscle spasms was sent to the pharmacy today.  She will notify us if her symptoms persist or worsen.  Primary osteoarthritis of both hands: PIP and DIP thickening.  No tenderness or synovitis noted.  Complete fist formation bilaterally.  Paresthesias in left hand: Not currently symptomatic.  Primary osteoarthritis of both knees: Good range of motion of both knee joints on examination today.  No warmth or effusion noted.  Primary osteoarthritis of both feet: She is not experiencing any increased discomfort in her feet at this time.  She has good range of motion of both ankle joints with no tenderness or synovitis.  Other medical conditions are listed as follows:  Thyroid nodule - Thyroid u/s 08/10/2021: Asymmetric markedly enlarged and heterogeneous of left thyroid lobe.  Multiple underlying nodules suspected.  fine-needle biopsy 08/22/21  History of anemia  Orders: No orders of the defined types were placed in this encounter.  Meds ordered this encounter  Medications   methocarbamol (ROBAXIN) 500 MG tablet    Sig: Take 1 tablet (500 mg total) by mouth daily as needed.    Dispense:  30 tablet    Refill:  0      Follow-Up Instructions: Return in about 3 months (around 04/02/2023) for Rheumatoid arthritis.   Gearldine Bienenstock, PA-C  Note - This record has been created using Dragon software.  Chart creation errors have been sought, but may not always  have been located. Such creation errors do not reflect on  the standard of medical care.

## 2022-12-31 ENCOUNTER — Ambulatory Visit: Payer: Self-pay | Attending: Physician Assistant | Admitting: Physician Assistant

## 2022-12-31 ENCOUNTER — Encounter: Payer: Self-pay | Admitting: Physician Assistant

## 2022-12-31 VITALS — BP 126/87 | HR 92 | Resp 14 | Ht 66.0 in | Wt 179.4 lb

## 2022-12-31 DIAGNOSIS — M170 Bilateral primary osteoarthritis of knee: Secondary | ICD-10-CM

## 2022-12-31 DIAGNOSIS — H15003 Unspecified scleritis, bilateral: Secondary | ICD-10-CM

## 2022-12-31 DIAGNOSIS — M0630 Rheumatoid nodule, unspecified site: Secondary | ICD-10-CM

## 2022-12-31 DIAGNOSIS — R202 Paresthesia of skin: Secondary | ICD-10-CM

## 2022-12-31 DIAGNOSIS — L930 Discoid lupus erythematosus: Secondary | ICD-10-CM

## 2022-12-31 DIAGNOSIS — M19041 Primary osteoarthritis, right hand: Secondary | ICD-10-CM

## 2022-12-31 DIAGNOSIS — Z79899 Other long term (current) drug therapy: Secondary | ICD-10-CM

## 2022-12-31 DIAGNOSIS — R7611 Nonspecific reaction to tuberculin skin test without active tuberculosis: Secondary | ICD-10-CM

## 2022-12-31 DIAGNOSIS — M19072 Primary osteoarthritis, left ankle and foot: Secondary | ICD-10-CM

## 2022-12-31 DIAGNOSIS — Z862 Personal history of diseases of the blood and blood-forming organs and certain disorders involving the immune mechanism: Secondary | ICD-10-CM

## 2022-12-31 DIAGNOSIS — M62838 Other muscle spasm: Secondary | ICD-10-CM

## 2022-12-31 DIAGNOSIS — M0579 Rheumatoid arthritis with rheumatoid factor of multiple sites without organ or systems involvement: Secondary | ICD-10-CM

## 2022-12-31 DIAGNOSIS — M19071 Primary osteoarthritis, right ankle and foot: Secondary | ICD-10-CM

## 2022-12-31 DIAGNOSIS — M19042 Primary osteoarthritis, left hand: Secondary | ICD-10-CM

## 2022-12-31 DIAGNOSIS — M542 Cervicalgia: Secondary | ICD-10-CM

## 2022-12-31 DIAGNOSIS — F17200 Nicotine dependence, unspecified, uncomplicated: Secondary | ICD-10-CM

## 2022-12-31 DIAGNOSIS — E041 Nontoxic single thyroid nodule: Secondary | ICD-10-CM

## 2022-12-31 MED ORDER — METHOCARBAMOL 500 MG PO TABS: 500.0000 mg | ORAL_TABLET | Freq: Every day | ORAL | 0 refills | Status: DC | PRN

## 2022-12-31 NOTE — Patient Instructions (Addendum)
Standing Labs We placed an order today for your standing lab work.   Please have your standing labs drawn at end-November and every 3 months   Please have your labs drawn 2 weeks prior to your appointment so that the provider can discuss your lab results at your appointment, if possible.  Please note that you may see your imaging and lab results in MyChart before we have reviewed them. We will contact you once all results are reviewed. Please allow our office up to 72 hours to thoroughly review all of the results before contacting the office for clarification of your results.  WALK-IN LAB HOURS  Monday through Thursday from 8:00 am -12:30 pm and 1:00 pm-5:00 pm and Friday from 8:00 am-12:00 pm.  Patients with office visits requiring labs will be seen before walk-in labs.  You may encounter longer than normal wait times. Please allow additional time. Wait times may be shorter on  Monday and Thursday afternoons.  We do not book appointments for walk-in labs. We appreciate your patience and understanding with our staff.   Labs are drawn by [facility]. Please bring your co-pay at the time of your lab draw.  You may receive a bill from [facility] for your lab work.  Please note if you are on Hydroxychloroquine and and an order has been placed for a Hydroxychloroquine level,  you will need to have it drawn 4 hours or more after your last dose.  If you wish to have your labs drawn at another location, please call the office 24 hours in advance so we can fax the orders.  The office is located at [address], [address], [place], [place] [address]   If you have any questions regarding directions or hours of operation,  please call [phone].   As a reminder, please drink plenty of water prior to coming for your lab work. Thanks!    Neck Exercises Ask your health care provider which exercises are safe for you. Do exercises exactly as told by your health care provider and adjust them as directed.  It is normal to feel mild stretching, pulling, tightness, or discomfort as you do these exercises. Stop right away if you feel sudden pain or your pain gets worse. Do not begin these exercises until told by your health care provider. Neck exercises can be important for many reasons. They can improve strength and maintain flexibility in your neck, which will help your upper back and prevent neck pain. Stretching exercises Rotation neck stretching  Sit in a chair or stand up. Place your feet flat on the floor, shoulder-width apart. Slowly turn your head (rotate) to the right until a slight stretch is felt. Turn it all the way to the right so you can look over your right shoulder. Do not tilt or tip your head. Hold this position for 10-30 seconds. Slowly turn your head (rotate) to the left until a slight stretch is felt. Turn it all the way to the left so you can look over your left shoulder. Do not tilt or tip your head. Hold this position for 10-30 seconds. Repeat __________ times. Complete this exercise __________ times a day. Neck retraction  Sit in a sturdy chair or stand up. Look straight ahead. Do not bend your neck. Use your fingers to push your chin backward (retraction). Do not bend your neck for this movement. Continue to face straight ahead. If you are doing the exercise properly, you will feel a slight sensation in your throat and  a stretch at the back of your neck. Hold the stretch for 1-2 seconds. Repeat __________ times. Complete this exercise __________ times a day. Strengthening exercises Neck press  Lie on your back on a firm bed or on the floor with a pillow under your head. Use your neck muscles to push your head down on the pillow and straighten your spine. Hold the position as well as you can. Keep your head facing up (in a neutral position) and your chin tucked. Slowly count to 5 while holding this position. Repeat __________ times. Complete this exercise __________  times a day. Isometrics These are exercises in which you strengthen the muscles in your neck while keeping your neck still (isometrics). Sit in a supportive chair and place your hand on your forehead. Keep your head and face facing straight ahead. Do not flex or extend your neck while doing isometrics. Push forward with your head and neck while pushing back with your hand. Hold for 10 seconds. Do the sequence again, this time putting your hand against the back of your head. Use your head and neck to push backward against the hand pressure. Finally, do the same exercise on either side of your head, pushing sideways against the pressure of your hand. Repeat __________ times. Complete this exercise __________ times a day. Prone head lifts  Lie face-down (prone position), resting on your elbows so that your chest and upper back are raised. Start with your head facing downward, near your chest. Position your chin either on or near your chest. Slowly lift your head upward. Lift until you are looking straight ahead. Then continue lifting your head as far back as you can comfortably stretch. Hold your head up for 5 seconds. Then slowly lower it to your starting position. Repeat __________ times. Complete this exercise __________ times a day. Supine head lifts  Lie on your back (supine position), bending your knees to point to the ceiling and keeping your feet flat on the floor. Lift your head slowly off the floor, raising your chin toward your chest. Hold for 5 seconds. Repeat __________ times. Complete this exercise __________ times a day. Scapular retraction  Stand with your arms at your sides. Look straight ahead. Slowly pull both shoulders (scapulae) backward and downward (retraction) until you feel a stretch between your shoulder blades in your upper back. Hold for 10-30 seconds. Relax and repeat. Repeat __________ times. Complete this exercise __________ times a day. Contact a health care  provider if: Your neck pain or discomfort gets worse when you do an exercise. Your neck pain or discomfort does not improve within 2 hours after you exercise. If you have any of these problems, stop exercising right away. Do not do the exercises again unless your health care provider says that you can. Get help right away if: You develop sudden, severe neck pain. If this happens, stop exercising right away. Do not do the exercises again unless your health care provider says that you can. This information is not intended to replace advice given to you by your health care provider. Make sure you discuss any questions you have with your health care provider. Document Revised: 08/22/2020 Document Reviewed: 08/22/2020 Elsevier Patient Education  2024 [employer].

## 2023-02-13 ENCOUNTER — Other Ambulatory Visit: Payer: Self-pay

## 2023-02-13 DIAGNOSIS — Z79899 Other long term (current) drug therapy: Secondary | ICD-10-CM

## 2023-02-14 LAB — CBC WITH DIFFERENTIAL/PLATELET
Absolute Lymphocytes: 2057 {cells}/uL (ref 850–3900)
Absolute Monocytes: 491 {cells}/uL (ref 200–950)
Basophils Absolute: 38 {cells}/uL (ref 0–200)
Basophils Relative: 0.7 %
Eosinophils Absolute: 281 {cells}/uL (ref 15–500)
Eosinophils Relative: 5.2 %
HCT: 36.3 % (ref 35.0–45.0)
Hemoglobin: 12 g/dL (ref 11.7–15.5)
MCH: 30.5 pg (ref 27.0–33.0)
MCHC: 33.1 g/dL (ref 32.0–36.0)
MCV: 92.1 fL (ref 80.0–100.0)
MPV: 11.3 fL (ref 7.5–12.5)
Monocytes Relative: 9.1 %
Neutro Abs: 2533 {cells}/uL (ref 1500–7800)
Neutrophils Relative %: 46.9 %
Platelets: 262 10*3/uL (ref 140–400)
RBC: 3.94 10*6/uL (ref 3.80–5.10)
RDW: 12.6 % (ref 11.0–15.0)
Total Lymphocyte: 38.1 %
WBC: 5.4 10*3/uL (ref 3.8–10.8)

## 2023-02-14 LAB — COMPLETE METABOLIC PANEL WITH GFR
AG Ratio: 1.4 (calc) (ref 1.0–2.5)
ALT: 9 U/L (ref 6–29)
AST: 19 U/L (ref 10–35)
Albumin: 4.1 g/dL (ref 3.6–5.1)
Alkaline phosphatase (APISO): 56 U/L (ref 37–153)
BUN: 19 mg/dL (ref 7–25)
CO2: 27 mmol/L (ref 20–32)
Calcium: 9.6 mg/dL (ref 8.6–10.4)
Chloride: 105 mmol/L (ref 98–110)
Creat: 0.95 mg/dL (ref 0.50–1.05)
Globulin: 3 g/dL (ref 1.9–3.7)
Glucose, Bld: 81 mg/dL (ref 65–99)
Potassium: 5 mmol/L (ref 3.5–5.3)
Sodium: 139 mmol/L (ref 135–146)
Total Bilirubin: 0.4 mg/dL (ref 0.2–1.2)
Total Protein: 7.1 g/dL (ref 6.1–8.1)
eGFR: 68 mL/min/{1.73_m2} (ref >=60)

## 2023-02-14 NOTE — Progress Notes (Signed)
CBC and CMP are normal.

## 2023-03-10 LAB — LAB REPORT - SCANNED
EGFR: 76
TSH: 1.03 (ref 0.41–5.90)

## 2023-03-24 NOTE — Progress Notes (Unsigned)
Office Visit Note  Patient: Samantha Hughes             Date of Birth: 1960-09-17           MRN: [id number]             PCP: Maurice Small, MD (Inactive) Referring: No ref. provider found Visit Date: 04/03/2023 Occupation: @GUAROCC @  Subjective:  Neck pain   History of Present Illness: Samantha Hughes is a 63 y.o. female with history of seropositive rheumatoid arthritis and discoid lupus.  Patient remains on Methotrexate 0.8 ml sq injections once weekly and folic acid 2 mg by mouth daily.  Patient has been off of Enbrel since the beginning of October due to loss of her job and insurance.  Patient states that she now has insurance but does not know if she necessarily needs to restart Enbrel at this time.  She has not had any signs or symptoms of a rheumatoid arthritis flare recently.  She has not had any joint swelling.  Patient has been experiencing ongoing left-sided neck pain.  She experiences muscle spasms intermittently but did not find methocarbamol to be effective at managing her symptoms.  She has tried over-the-counter products as well as topical agents with minimal relief.  She has been experiencing discomfort in her neck at night as well as with range of motion throughout the day.  She has noticed limited range of motion especially with lateral rotation to the left.  Patient completed 1 PT session and has continued home exercises with no improvement.  She also has tried going for massage but only had relief for about 1 day.  She denies any radiating pain down her left arm but is having some radiating pain into the left trapezius muscle.  She denies taking any prednisone recently.   She denies any recent or recurrent infections.   Activities of Daily Living:  Patient reports morning stiffness for 2-3 minutes.   Patient Reports nocturnal pain.  Difficulty dressing/grooming: Denies Difficulty climbing stairs: Denies Difficulty getting out of chair: Denies Difficulty using hands for  taps, buttons, cutlery, and/or writing: Denies  Review of Systems  Constitutional:  Negative for fatigue.  HENT:  Negative for mouth sores and mouth dryness.   Eyes:  Positive for dryness.  Respiratory:  Negative for shortness of breath.   Cardiovascular:  Negative for chest pain and palpitations.  Gastrointestinal:  Positive for constipation. Negative for blood in stool and diarrhea.  Endocrine: Negative for increased urination.  Genitourinary:  Negative for involuntary urination.  Musculoskeletal:  Positive for joint pain, joint pain, joint swelling, morning stiffness and muscle tenderness. Negative for gait problem, myalgias, muscle weakness and myalgias.  Skin:  Negative for color change, rash, hair loss and sensitivity to sunlight.  Allergic/Immunologic: Positive for susceptible to infections.  Neurological:  Negative for dizziness and headaches.  Hematological:  Negative for swollen glands.  Psychiatric/Behavioral:  Negative for depressed mood and sleep disturbance. The patient is not nervous/anxious.     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   High risk medication use 02/06/2016   Positive PPD, treated  02/06/2016    Past Medical History:  Diagnosis Date   Depression    Osteoarthritis    Rheumatoid arthritis (HCC)    Thyroid nodule     Family History  Problem Relation Age of Onset   Diabetes Mother    Hypertension Mother    Diabetes Sister    Diabetes Maternal Grandmother    Cancer Maternal Grandfather    Past Surgical History:  Procedure Laterality Date   eye lift      ingrown toenail Bilateral    bilateral great toes   TUBAL LIGATION     Social History   Social History Narrative   Not on file   Immunization History  Administered Date(s) Administered   Moderna Sars-Covid-2 Vaccination 05/17/2019, 06/15/2019, 03/02/2020, 12/08/2020     Objective: Vital Signs: BP 113/80 (BP Location: Left Arm, Patient Position: Sitting, Cuff Size: Large)   Pulse 78   Resp 14   Ht 5\' 6"  (1.676 m)   Wt 182 lb (82.6 kg)   BMI 29.38 kg/m    Physical Exam Vitals and nursing note reviewed.  Constitutional:      Appearance: She is well-developed.  HENT:     Head: Normocephalic and atraumatic.  Eyes:     Conjunctiva/sclera: Conjunctivae normal.  Cardiovascular:     Rate and Rhythm: Normal rate and regular rhythm.     Heart sounds: Normal heart sounds.  Pulmonary:     Effort: Pulmonary effort is normal.     Breath sounds: Normal breath sounds.  Abdominal:     General: Bowel sounds are normal.     Palpations: Abdomen is soft.  Musculoskeletal:     Cervical back: Normal range of motion.  Lymphadenopathy:     Cervical: No cervical adenopathy.  Skin:    General: Skin is warm and dry.     Capillary Refill: Capillary refill takes less than 2 seconds.  Neurological:     Mental Status: She is alert and oriented to person, place, and time.  Psychiatric:        Behavior: Behavior normal.      Musculoskeletal Exam: C-spine has limited ROM with lateral rotation especially to the left.  Left trapezius muscle tension and tenderness noted.  No midline spinal tenderness.  Shoulder joints, elbow joints, wrist joints, MCPs, PIPs, and DIPs good ROM with no synovitis.  Complete fist formation bilaterally.  Thickening of the right 2nd MCP joint but no synovitis noted.  Hip joints have good ROM with no groin pain.  Knee joints have good ROM with no warmth or effusion.  Ankle joints have good ROM with no tenderness or joint swelling.   CDAI Exam: CDAI Score: -- Patient  Global: --; Provider Global: -- Swollen: --; Tender: -- Joint Exam 04/03/2023   No joint exam has been documented for this visit   There is currently no information documented on the homunculus. Go to the Rheumatology activity and complete the homunculus joint exam.  Investigation: No additional findings.  Imaging: No results found.  Recent Labs: Lab Results  Component Value Date   WBC 5.4 02/13/2023   HGB 12.0 02/13/2023   PLT 262 02/13/2023   NA 139 02/13/2023   K 5.0 02/13/2023   CL 105 02/13/2023   CO2 27 02/13/2023   GLUCOSE 81 02/13/2023   BUN 19 02/13/2023   CREATININE 0.95 02/13/2023   BILITOT 0.4 02/13/2023   ALKPHOS 50 10/07/2016   AST 19 02/13/2023   ALT 9 02/13/2023   PROT 7.1 02/13/2023  ALBUMIN 4.2 10/07/2016   CALCIUM 9.6 02/13/2023   GFRAA 97 07/18/2020   QFTBGOLDPLUS POSITIVE (A) 02/12/2018    Speciality Comments: Humira (March 2022 to 10/09/20 - stopped d/t injection site reaction), changed to Enbrel on 10/25/20  Procedures:  No procedures performed Allergies: Arava [leflunomide], Plaquenil [hydroxychloroquine sulfate], and Humira [adalimumab]     Assessment / Plan:     Visit Diagnoses: Rheumatoid arthritis with rheumatoid factor of multiple sites without organ or systems involvement (HCC): She has no synovitis on examination today.  She has not had any signs or symptoms of a rheumatoid arthritis flare.  She has clinically been doing well on methotrexate 0.8 mL apiece injections once weekly along with folic acid 2 mg daily.  She has been off of Enbrel since early October 2024 due to loss of insurance.  She now has insurance but is unsure if she would like to resume Enbrel at this time since she has clinically been doing well.  She is concerned about the risk for immunosuppression this time of year if she resumes Enbrel.  For now she would like to remain on methotrexate as monotherapy.  She was advised to notify us if she develops any new or worsening  symptoms.  She will follow-up in the office in 5 months or sooner if needed.  High risk medication use - Methotrexate 0.8 ml sq injections once weekly, folic acid 2 mg by mouth daily.  Discontinued Enbrel early October 2024-loss of insurance.   CBC and CMP within normal limits on 02/13/2023.  Her next lab work will be due in March and every 3 months to monitor for drug toxicity. CXR no acute abnormality on 06/26/22.  No recent or recurrent infections.  Discussed the importance of holding methotrexate if she develops signs or symptoms of an infection and to resume once the infection has completely cleared.  Rheumatoid nodulosis (HCC) - No nodules noted.   Positive PPD, treated - Chest x-ray did not reveal any acute abnormality on 06/26/2022.   Bilateral scleritis: No signs or symptoms of a scleritis flare.  No eye pain or photophobia at this time.   Discoid lupus: No recurrence.  No active discoid lesions.   Neck pain -Patient presents today with ongoing left sided cervical spinal pain since the end of July 2024.  No injury prior to the onset of symptoms.  With her previous job she was having to wear a respirator with a backpack which she feels may have contributed to her symptoms.  She was evaluated in our office on 12/31/2022 at which time we held off on obtaining x-rays since she did not have insurance.  She was given neck exercises to perform at home which she has continued for the past 3 months.  She also completed 1 session of PT but had to discontinue after losing her job/insurance.  At her last office visit she was given a prescription for methocarbamol which has provided minimal relief.  She has tried using a heating pad as well.  Patient has tried taking over-the-counter products for symptomatic relief as well as using topical agents.  She has also been getting massages on a maintenance basis with minimal relief.  She continues to experience nocturnal pain in her cervical spine as well as  discomfort and limitation with range of motion. She is having some radiating pain into the left trapezius muscle but is not having any numbness or tingling down the left arm.  On examination she has good flexion and extension but  has limited range of motion with lateral rotation especially to the left.   X-rays of the cervical spine obtained today for further evaluation--Reviewed results with the Dr. Nicki Reaper and the patient today.   Plan to refer the patient to Dr. Christell Constant for further evaluation and treatment.   Plan: XR Cervical Spine 2 or 3 views  Trapezius muscle spasm - Inadequate response to Robaxin.  Massage last weekend-temporary relief.    Primary osteoarthritis of both hands: No tenderness of synovitis.   Paresthesias in left hand: Not currently symptomatic.   Primary osteoarthritis of both knees: She has good ROM of both knees with no warmth or effusion.   Primary osteoarthritis of both feet: Ankle joints have good ROM with no tenderness or joint swelling.  Other medical conditions are listed as follows:   History of anemia  Smoker  Thyroid nodule  Orders: Orders Placed This Encounter  Procedures   XR Cervical Spine 2 or 3 views   No orders of the defined types were placed in this encounter.     Follow-Up Instructions: Return in about 5 months (around 09/01/2023) for Rheumatoid arthritis.   Gearldine Bienenstock, PA-C  Note - This record has been created using Dragon software.  Chart creation errors have been sought, but may not always  have been located. Such creation errors do not reflect on  the standard of medical care.

## 2023-04-03 ENCOUNTER — Ambulatory Visit (INDEPENDENT_AMBULATORY_CARE_PROVIDER_SITE_OTHER): Payer: Managed Care, Other (non HMO)

## 2023-04-03 ENCOUNTER — Encounter: Payer: Self-pay | Admitting: Physician Assistant

## 2023-04-03 ENCOUNTER — Ambulatory Visit: Payer: Managed Care, Other (non HMO) | Attending: Physician Assistant | Admitting: Physician Assistant

## 2023-04-03 VITALS — BP 113/80 | HR 78 | Resp 14 | Ht 66.0 in | Wt 182.0 lb

## 2023-04-03 DIAGNOSIS — M542 Cervicalgia: Secondary | ICD-10-CM | POA: Diagnosis not present

## 2023-04-03 DIAGNOSIS — M0630 Rheumatoid nodule, unspecified site: Secondary | ICD-10-CM | POA: Diagnosis not present

## 2023-04-03 DIAGNOSIS — M19041 Primary osteoarthritis, right hand: Secondary | ICD-10-CM

## 2023-04-03 DIAGNOSIS — R202 Paresthesia of skin: Secondary | ICD-10-CM

## 2023-04-03 DIAGNOSIS — M19042 Primary osteoarthritis, left hand: Secondary | ICD-10-CM

## 2023-04-03 DIAGNOSIS — F17200 Nicotine dependence, unspecified, uncomplicated: Secondary | ICD-10-CM

## 2023-04-03 DIAGNOSIS — Z79899 Other long term (current) drug therapy: Secondary | ICD-10-CM

## 2023-04-03 DIAGNOSIS — E041 Nontoxic single thyroid nodule: Secondary | ICD-10-CM

## 2023-04-03 DIAGNOSIS — L930 Discoid lupus erythematosus: Secondary | ICD-10-CM

## 2023-04-03 DIAGNOSIS — R7611 Nonspecific reaction to tuberculin skin test without active tuberculosis: Secondary | ICD-10-CM

## 2023-04-03 DIAGNOSIS — M62838 Other muscle spasm: Secondary | ICD-10-CM

## 2023-04-03 DIAGNOSIS — M0579 Rheumatoid arthritis with rheumatoid factor of multiple sites without organ or systems involvement: Secondary | ICD-10-CM | POA: Diagnosis not present

## 2023-04-03 DIAGNOSIS — M19072 Primary osteoarthritis, left ankle and foot: Secondary | ICD-10-CM

## 2023-04-03 DIAGNOSIS — H15003 Unspecified scleritis, bilateral: Secondary | ICD-10-CM

## 2023-04-03 DIAGNOSIS — M170 Bilateral primary osteoarthritis of knee: Secondary | ICD-10-CM

## 2023-04-03 DIAGNOSIS — M19071 Primary osteoarthritis, right ankle and foot: Secondary | ICD-10-CM

## 2023-04-03 DIAGNOSIS — Z862 Personal history of diseases of the blood and blood-forming organs and certain disorders involving the immune mechanism: Secondary | ICD-10-CM

## 2023-04-03 NOTE — Patient Instructions (Signed)
 Standing Labs We placed an order today for your standing lab work.   Please have your standing labs drawn in march and every 3 months   Please have your labs drawn 2 weeks prior to your appointment so that the provider can discuss your lab results at your appointment, if possible.  Please note that you may see your imaging and lab results in MyChart before we have reviewed them. We will contact you once all results are reviewed. Please allow our office up to 72 hours to thoroughly review all of the results before contacting the office for clarification of your results.  WALK-IN LAB HOURS  Monday through Thursday from 8:00 am -12:30 pm and 1:00 pm-5:00 pm and Friday from 8:00 am-12:00 pm.  Patients with office visits requiring labs will be seen before walk-in labs.  You may encounter longer than normal wait times. Please allow additional time. Wait times may be shorter on  Monday and Thursday afternoons.  We do not book appointments for walk-in labs. We appreciate your patience and understanding with our staff.   Labs are drawn by [facility]. Please bring your co-pay at the time of your lab draw.  You may receive a bill from [facility] for your lab work.  Please note if you are on Hydroxychloroquine and and an order has been placed for a Hydroxychloroquine level,  you will need to have it drawn 4 hours or more after your last dose.  If you wish to have your labs drawn at another location, please call the office 24 hours in advance so we can fax the orders.  The office is located at [address], [address], [place], [place] [address]   If you have any questions regarding directions or hours of operation,  please call [phone].   As a reminder, please drink plenty of water prior to coming for your lab work. Thanks!

## 2023-04-03 NOTE — Addendum Note (Signed)
Addended by: Ellen Henri on: 04/03/2023 11:32 AM   Modules accepted: Orders

## 2023-04-16 ENCOUNTER — Other Ambulatory Visit (INDEPENDENT_AMBULATORY_CARE_PROVIDER_SITE_OTHER): Payer: Self-pay

## 2023-04-16 ENCOUNTER — Encounter: Payer: Self-pay | Admitting: Orthopedic Surgery

## 2023-04-16 ENCOUNTER — Other Ambulatory Visit: Payer: Self-pay

## 2023-04-16 ENCOUNTER — Ambulatory Visit (INDEPENDENT_AMBULATORY_CARE_PROVIDER_SITE_OTHER): Payer: Managed Care, Other (non HMO) | Admitting: Orthopedic Surgery

## 2023-04-16 VITALS — BP 119/86 | HR 90 | Ht 66.0 in | Wt 152.0 lb

## 2023-04-16 DIAGNOSIS — M5412 Radiculopathy, cervical region: Secondary | ICD-10-CM | POA: Diagnosis not present

## 2023-04-16 DIAGNOSIS — M542 Cervicalgia: Secondary | ICD-10-CM

## 2023-04-16 MED ORDER — METHYLPREDNISOLONE 4 MG PO TBPK: ORAL_TABLET | ORAL | 0 refills | Status: DC

## 2023-04-16 MED ORDER — GABAPENTIN 300 MG PO CAPS: 300.0000 mg | ORAL_CAPSULE | Freq: Three times a day (TID) | ORAL | 0 refills | Status: DC

## 2023-04-16 NOTE — Progress Notes (Signed)
 Orthopedic Spine Surgery Office Note  Assessment: Patient is a 63 y.o. female with neck pain that radiates into the left shoulder and lateral arm to the level of the elbow, suspect radiculopathy   Plan: -Explained that initially conservative treatment is tried as a significant number of patients may experience relief with these treatment modalities. Discussed that the conservative treatments include:  -activity modification  -physical therapy  -over the counter pain medications  -medrol  dosepak  -cervical steroid injections -Patient has tried over 6 weeks of conservative treatment with Dr. Dolphus so recommended MRI of the cervical spine to evaluate for radiculopathy -Prescribed a Medrol  Dosepak and gabapentin  for additional pain relief -Patient has tried PT, home exercise program, Tylenol  -Patient should return to office in 4 weeks, x-rays at next visit: none   Patient expressed understanding of the plan and all questions were answered to the patient's satisfaction.   ___________________________________________________________________________   History:  Patient is a 63 y.o. female who presents today for cervical spine.  Patient has neck pain that radiates into the left upper extremity.  She has noticed this pain since July 2024.  There was no trauma or injury that preceded the onset of the pain.  She feels it starts in her neck and goes into the left shoulder and left lateral arm to the level of the elbow.  It does not radiate past the elbow.  She does not have pain radiating into the right upper extremity.  She has the pain with activity and at rest.  She is noticing the pain on a daily basis.  She notices the pain even when not moving the arm.   Weakness: Denies Difficulty with fine motor skills (e.g., buttoning shirts, handwriting): Denies Symptoms of imbalance: Denies Paresthesias and numbness: Denies Bowel or bladder incontinence: Denies Saddle anesthesia:  Denies  Treatments tried: PT, home exercise program, Tylenol   Review of systems: Denies fevers and chills, night sweats, unexplained weight loss, history of cancer.  Has had pain that wakes her at night  Past medical history: Depression Rheumatoid arthritis  Allergies: leflunomide, plaquenil, adalimumab   Past surgical history:  Blepharoplasty Tubal ligation  Social history: Denies use of nicotine product (smoking, vaping, patches, smokeless) Alcohol use: rare Denies recreational drug use   Physical Exam:  BMI of 24.5  General: no acute distress, appears stated age Neurologic: alert, answering questions appropriately, following commands Respiratory: unlabored breathing on room air, symmetric chest rise Psychiatric: appropriate affect, normal cadence to speech   MSK (spine):  -Strength exam      Left  Right Grip strength                5/5  5/5 Interosseus   5/5   5/5 Wrist extension  5/5  5/5 Wrist flexion   5/5  5/5 Elbow flexion   5/5  5/5 Deltoid    5/5  5/5  EHL    5/5  5/5 TA    5/5  5/5 GSC    5/5  5/5 Knee extension  5/5  5/5 Hip flexion   5/5  5/5  -Sensory exam    Sensation intact to light touch in L3-S1 nerve distributions of bilateral lower extremities  Sensation intact to light touch in C5-T1 nerve distributions of bilateral upper extremities  -Brachioradialis DTR: 2/4 on the left, 2/4 on the right -Biceps DTR: 2/4 on the left, 2/4 on the right -Triceps DTR: 2/4 on the left, 2/4 on the right -Achilles DTR: 2/4 on the left, 2/4 on the  right -Patellar tendon DTR: 2/4 on the left, 2/4 on the right  -Spurling: negative bilaterally -Hoffman sign: negative bilaterally -Clonus: no beats bilaterally  -Interosseous wasting: none seen -Grip and release test: negative -Romberg: negative -Gait: normal  Left shoulder exam: no pain through range of motion, negative jobe, negative belly press, negative hawkins, no weakness with external rotation with  arm at side Right shoulder exam: no pain through range of motion  Tinel's at wrist: negative bilaterally  Phalen's at wrist: negative bilaterally  Durkan's: negative bilaterally   Tinel's at elbow: negative bilaterally   Imaging: XRs of the cervical spine from 04/03/2023 were independently reviewed and interpreted, showing disc height loss with anterior osteophyte formation at C4/5, C5/6, C6/7, C7/T1. No fracutre or dislocation seen. Neutral alignment.   XRs of the cervical spine from 04/16/2023 were independently reviewed and interpreted, showing disc height loss with anterior osteophyte formation at C4/5, C5/6, C6/7, and C7/T1. No fracture or dislocation seen. No evidence of instability on flexion/extension views.    Patient name: Samantha Hughes Patient MRN: [id number] Date of visit: 04/16/23

## 2023-04-20 ENCOUNTER — Ambulatory Visit
Admission: RE | Admit: 2023-04-20 | Discharge: 2023-04-20 | Disposition: A | Discharge status: Home / Self Care | Payer: Managed Care, Other (non HMO) | Source: Ambulatory Visit | Attending: Orthopedic Surgery | Admitting: Orthopedic Surgery

## 2023-04-20 DIAGNOSIS — M5412 Radiculopathy, cervical region: Secondary | ICD-10-CM

## 2023-05-05 ENCOUNTER — Telehealth: Payer: Self-pay

## 2023-05-05 NOTE — Telephone Encounter (Signed)
 Melanie answering service called states after patient took Gabapentin she is now having bowel incontinence and increased Flatulence. Please call patient.     [phone]

## 2023-05-05 NOTE — Telephone Encounter (Signed)
 I called and advised patient of Dr. Kathi Der message and she said ok.

## 2023-05-14 ENCOUNTER — Ambulatory Visit (INDEPENDENT_AMBULATORY_CARE_PROVIDER_SITE_OTHER): Payer: Managed Care, Other (non HMO) | Admitting: Orthopedic Surgery

## 2023-05-14 DIAGNOSIS — M5412 Radiculopathy, cervical region: Secondary | ICD-10-CM

## 2023-05-14 NOTE — Progress Notes (Signed)
 Orthopedic Spine Surgery Office Note   Assessment: Patient is a 63 y.o. female with neck pain that radiates into the left shoulder and lateral arm to the level of the elbow, suspect radiculopathy     Plan: -Patient has tried PT, home exercise program, Tylenol, medrol dose pak, gabapentin -Recommended diagnostic/therapeutic cervical ESI.  Referral provided to her today -Patient should return to office in 6 weeks, x-rays at next visit: none     Patient expressed understanding of the plan and all questions were answered to the patient's satisfaction.    ___________________________________________________________________________     History:   Patient is a 63 y.o. female who presents today for follow-up on her cervical spine.  Patient has neck pain that radiates into the left upper extremity.  She feels the pain starts in her neck and goes into the left shoulder and left lateral arm to the level of the elbow.  It does not radiate past the elbow.  She has not noticed any changes in her symptoms since she was last seen.  She did notice some gastrointestinal side effects with gabapentin so she stopped that.  She still has the pain on a daily basis and notices it with activity and at rest.  Treatments tried: PT, home exercise program, Tylenol, medrol dose pak, gabapentin    Physical Exam:   General: no acute distress, appears stated age Neurologic: alert, answering questions appropriately, following commands Respiratory: unlabored breathing on room air, symmetric chest rise Psychiatric: appropriate affect, normal cadence to speech     MSK (spine):   -Strength exam                                                   Left                  Right Grip strength                5/5                  5/5 Interosseus                  5/5                  5/5 Wrist extension            5/5                  5/5 Wrist flexion                 5/5                  5/5 Elbow flexion                5/5                   5/5 Deltoid                          5/5                  5/5   -Sensory exam                           Sensation intact to light touch in C5-T1  nerve distributions of bilateral upper extremities   -Brachioradialis DTR: 2/4 on the left, 2/4 on the right -Biceps DTR: 2/4 on the left, 2/4 on the right -Triceps DTR: 2/4 on the left, 2/4 on the right   -Spurling: Positive Spurling on the left, negative on the right   Left shoulder exam: no pain through range of motion, negative jobe, negative belly press, negative hawkins, no weakness with external rotation with arm at side Right shoulder exam: no pain through range of motion   Imaging: XRs of the cervical spine from 04/03/2023 were independently reviewed and interpreted, showing disc height loss with anterior osteophyte formation at C4/5, C5/6, C6/7, C7/T1. No fracutre or dislocation seen. Neutral alignment.    XRs of the cervical spine from 04/16/2023 were independently reviewed and interpreted, showing disc height loss with anterior osteophyte formation at C4/5, C5/6, C6/7, and C7/T1. No fracture or dislocation seen. No evidence of instability on flexion/extension views.   MRI of the cervical spine from 04/20/2023 was independently reviewed and interpreted, showing bilateral foraminal stenosis at C4/5.  No other significant stenosis seen.  No T2 cord signal change.     Patient name: Samantha Hughes Patient MRN: [id number] Date of visit: 05/14/23

## 2023-05-23 ENCOUNTER — Telehealth: Payer: Self-pay | Admitting: Orthopedic Surgery

## 2023-05-23 NOTE — Telephone Encounter (Signed)
 I called patient and advised.

## 2023-05-23 NOTE — Telephone Encounter (Signed)
 Pt called stating Dr Christell Constant discussed an injection in the neck. Only seen referral for MRI. Please call pt about this matter. Pt phone is [phone].

## 2023-05-23 NOTE — Addendum Note (Signed)
 Addended by: Willia Craze on: 05/23/2023 03:31 PM   Modules accepted: Orders

## 2023-05-26 ENCOUNTER — Telehealth: Payer: Self-pay

## 2023-05-26 ENCOUNTER — Other Ambulatory Visit: Payer: Self-pay

## 2023-05-26 DIAGNOSIS — Z79899 Other long term (current) drug therapy: Secondary | ICD-10-CM

## 2023-05-26 NOTE — Telephone Encounter (Signed)
 Patient will have recent lipid panel faxed to our office from her PCP. Patient reports lipids were checked in December 2024.

## 2023-05-27 LAB — CBC WITH DIFFERENTIAL/PLATELET
Absolute Lymphocytes: 2340 {cells}/uL (ref 850–3900)
Absolute Monocytes: 317 {cells}/uL (ref 200–950)
Basophils Absolute: 42 {cells}/uL (ref 0–200)
Basophils Relative: 0.8 %
Eosinophils Absolute: 218 {cells}/uL (ref 15–500)
Eosinophils Relative: 4.2 %
HCT: 38.4 % (ref 35.0–45.0)
Hemoglobin: 12.7 g/dL (ref 11.7–15.5)
MCH: 30.5 pg (ref 27.0–33.0)
MCHC: 33.1 g/dL (ref 32.0–36.0)
MCV: 92.3 fL (ref 80.0–100.0)
MPV: 11 fL (ref 7.5–12.5)
Monocytes Relative: 6.1 %
Neutro Abs: 2283 {cells}/uL (ref 1500–7800)
Neutrophils Relative %: 43.9 %
Platelets: 258 10*3/uL (ref 140–400)
RBC: 4.16 10*6/uL (ref 3.80–5.10)
RDW: 13.4 % (ref 11.0–15.0)
Total Lymphocyte: 45 %
WBC: 5.2 10*3/uL (ref 3.8–10.8)

## 2023-05-27 LAB — COMPLETE METABOLIC PANEL WITH GFR
AG Ratio: 1.4 (calc) (ref 1.0–2.5)
ALT: 8 U/L (ref 6–29)
AST: 16 U/L (ref 10–35)
Albumin: 4.1 g/dL (ref 3.6–5.1)
Alkaline phosphatase (APISO): 50 U/L (ref 37–153)
BUN: 16 mg/dL (ref 7–25)
CO2: 27 mmol/L (ref 20–32)
Calcium: 9.8 mg/dL (ref 8.6–10.4)
Chloride: 106 mmol/L (ref 98–110)
Creat: 0.97 mg/dL (ref 0.50–1.05)
Globulin: 2.9 g/dL (ref 1.9–3.7)
Glucose, Bld: 80 mg/dL (ref 65–99)
Potassium: 4.1 mmol/L (ref 3.5–5.3)
Sodium: 142 mmol/L (ref 135–146)
Total Bilirubin: 0.3 mg/dL (ref 0.2–1.2)
Total Protein: 7 g/dL (ref 6.1–8.1)
eGFR: 66 mL/min/{1.73_m2} (ref >=60)

## 2023-05-27 NOTE — Progress Notes (Signed)
 CBC and CMP are normal.

## 2023-06-02 ENCOUNTER — Telehealth: Payer: Self-pay | Admitting: *Deleted

## 2023-06-02 NOTE — Telephone Encounter (Signed)
 Labs received from: [facility]   Drawn on:03/10/2023  Reviewed by: Dr. Pollyann Savoy   Labs drawn: Lipid Panel   Results: Cholesterol 260   HDLD 100  Calc LDL 150  Non- HDL 160   Patient is on MTX 0.8 mL weekly and Folic Acid 2 mg po daily.

## 2023-06-04 ENCOUNTER — Other Ambulatory Visit: Payer: Self-pay

## 2023-06-04 ENCOUNTER — Ambulatory Visit: Admitting: Physical Medicine and Rehabilitation

## 2023-06-04 VITALS — BP 138/86 | HR 73

## 2023-06-04 DIAGNOSIS — M5412 Radiculopathy, cervical region: Secondary | ICD-10-CM

## 2023-06-04 MED ORDER — METHYLPREDNISOLONE ACETATE 40 MG/ML IJ SUSP
40.0000 mg | Freq: Once | INTRAMUSCULAR | Status: AC
  Administered 2023-06-04: 40 mg

## 2023-06-04 NOTE — Progress Notes (Unsigned)
 Pain Scale   Average Pain 5        +Driver, -BT, -Dye Allergies.

## 2023-06-04 NOTE — Patient Instructions (Signed)

## 2023-06-06 NOTE — Procedures (Signed)
 Cervical Epidural Steroid Injection - Interlaminar Approach with Fluoroscopic Guidance  Patient: Samantha Hughes      Date of Birth: 1961-02-27 MRN: [id number] PCP: Maurice Small, MD (Inactive)      Visit Date: 06/04/2023   Universal Protocol:    Date/Time: 03/28/258:59 AM  Consent Given By: the patient  Position: PRONE  Additional Comments: Vital signs were monitored before and after the procedure. Patient was prepped and draped in the usual sterile fashion. The correct patient, procedure, and site was verified.   Injection Procedure Details:   Procedure diagnoses: Radiculopathy, cervical region [M54.12]    Meds Administered:  Meds ordered this encounter  Medications   methylPREDNISolone acetate (DEPO-MEDROL) injection 40 mg     Laterality: Left  Location/Site: C7-T1  Needle: 3.5 in., 20 ga. Tuohy  Needle Placement: Paramedian epidural space  Findings:  -Comments: Excellent flow of contrast into the epidural space.  Procedure Details: Using a paramedian approach from the side mentioned above, the region overlying the inferior lamina was localized under fluoroscopic visualization and the soft tissues overlying this structure were infiltrated with 4 ml. of 1% Lidocaine without Epinephrine. A # 20 gauge, Tuohy needle was inserted into the epidural space using a paramedian approach.  The epidural space was localized using loss of resistance along with contralateral oblique bi-planar fluoroscopic views.  After negative aspirate for air, blood, and CSF, a 2 ml. volume of Isovue-250 was injected into the epidural space and the flow of contrast was observed. Radiographs were obtained for documentation purposes.   The injectate was administered into the level noted above.  Additional Comments:  The patient tolerated the procedure well Dressing: 2 x 2 sterile gauze and Band-Aid    Post-procedure details: Patient was observed during the procedure. Post-procedure  instructions were reviewed.  Patient left the clinic in stable condition.

## 2023-06-06 NOTE — Progress Notes (Signed)
 Samantha Hughes - 63 y.o. female MRN [id number]  Date of birth: September 19, 1960  Office Visit Note: Visit Date: 06/04/2023 PCP: Maurice Small, MD (Inactive) Referred by: London Sheer, MD  Subjective: Chief Complaint  Patient presents with   Neck - Pain   HPI:  Samantha Hughes is a 63 y.o. female who comes in today at the request of Dr. Willia Craze for planned Left C7-T1 Cervical Interlaminar epidural steroid injection with fluoroscopic guidance.  The patient has failed conservative care including home exercise, medications, time and activity modification.  This injection will be diagnostic and hopefully therapeutic.  Please see requesting physician notes for further details and justification.   ROS Otherwise per HPI.  Assessment & Plan: Visit Diagnoses:    ICD-10-CM   1. Radiculopathy, cervical region  M54.12 XR C-ARM NO REPORT    Epidural Steroid injection    methylPREDNISolone acetate (DEPO-MEDROL) injection 40 mg      Plan: No additional findings.   Meds & Orders:  Meds ordered this encounter  Medications   methylPREDNISolone acetate (DEPO-MEDROL) injection 40 mg    Orders Placed This Encounter  Procedures   XR C-ARM NO REPORT   Epidural Steroid injection    Follow-up: Return for visit to requesting provider as needed.   Procedures: No procedures performed  Cervical Epidural Steroid Injection - Interlaminar Approach with Fluoroscopic Guidance  Patient: Samantha Hughes      Date of Birth: [birth year] MRN: [id number] PCP: Maurice Small, MD (Inactive)      Visit Date: 06/04/2023   Universal Protocol:    Date/Time: 03/28/258:59 AM  Consent Given By: the patient  Position: PRONE  Additional Comments: Vital signs were monitored before and after the procedure. Patient was prepped and draped in the usual sterile fashion. The correct patient, procedure, and site was verified.   Injection Procedure Details:   Procedure diagnoses: Radiculopathy, cervical  region [M54.12]    Meds Administered:  Meds ordered this encounter  Medications   methylPREDNISolone acetate (DEPO-MEDROL) injection 40 mg     Laterality: Left  Location/Site: C7-T1  Needle: 3.5 in., 20 ga. Tuohy  Needle Placement: Paramedian epidural space  Findings:  -Comments: Excellent flow of contrast into the epidural space.  Procedure Details: Using a paramedian approach from the side mentioned above, the region overlying the inferior lamina was localized under fluoroscopic visualization and the soft tissues overlying this structure were infiltrated with 4 ml. of 1% Lidocaine without Epinephrine. A # 20 gauge, Tuohy needle was inserted into the epidural space using a paramedian approach.  The epidural space was localized using loss of resistance along with contralateral oblique bi-planar fluoroscopic views.  After negative aspirate for air, blood, and CSF, a 2 ml. volume of Isovue-250 was injected into the epidural space and the flow of contrast was observed. Radiographs were obtained for documentation purposes.   The injectate was administered into the level noted above.  Additional Comments:  The patient tolerated the procedure well Dressing: 2 x 2 sterile gauze and Band-Aid    Post-procedure details: Patient was observed during the procedure. Post-procedure instructions were reviewed.  Patient left the clinic in stable condition.   Clinical History: CLINICAL DATA:  Cervical radiculopathy, no red flags cervical radiculopathy   EXAM: MRI CERVICAL SPINE WITHOUT CONTRAST   TECHNIQUE: Multiplanar, multisequence MR imaging of the cervical spine was performed. No intravenous contrast was administered.   COMPARISON:  X-ray 04/16/2023   FINDINGS: Alignment: Physiologic.   Vertebrae: No acute fracture. No  evidence of discitis. Reactive subchondral bone marrow edema associated with the left C2-C3 facet joint. Patchy increased T2 marrow signal spanning from C6  through T3, likely a combination of discogenic endplate marrow changes and generalized marrow heterogeneity. No well-defined marrow replacing bone lesion.   Cord: Normal signal and morphology.   Posterior Fossa, vertebral arteries, paraspinal tissues: Left thyroid lobe is enlarged and heterogeneous with multiple nodules, previously characterized by ultrasound in June of 2023 with subsequent FNA.   Disc levels:   C2-C3: Minimal disc bulge with left greater than right facet arthropathy. No significant foraminal or canal stenosis.   C3-C4: Disc osteophyte complex with bilateral facet arthropathy. Mild canal stenosis with mild bilateral foraminal stenosis.   C4-C5: Disc osteophyte complex with bilateral facet arthropathy and uncovertebral spurring. Mild canal stenosis with moderate bilateral foraminal stenosis.   C5-C6: Disc osteophyte complex with facet and uncovertebral spurring. No significant canal stenosis. Mild left foraminal stenosis.   C6-C7: Disc osteophyte complex with facet and uncovertebral spurring. No significant canal stenosis. Mild-to-moderate right and mild left foraminal stenosis.   C7-T1: Bilateral facet arthropathy without foraminal or canal stenosis.   IMPRESSION: 1. Multilevel cervical spondylosis, most pronounced at the C4-5 level where there is mild canal stenosis and moderate bilateral foraminal stenosis. 2. Mild canal stenosis and mild bilateral foraminal stenosis at C3-4. 3. Reactive subchondral bone marrow edema associated with the left C2-C3 facet joint, which may be a focal source of pain.     Electronically Signed   By: Duanne Guess D.O.   On: 05/02/2023 16:18     Objective:  VS:  HT:    WT:   BMI:     BP:138/86  HR:73bpm  TEMP: ( )  RESP:  Physical Exam Vitals and nursing note reviewed.  Constitutional:      General: She is not in acute distress.    Appearance: Normal appearance. She is not ill-appearing.  HENT:     Head:  Normocephalic and atraumatic.     Right Ear: External ear normal.     Left Ear: External ear normal.  Eyes:     Extraocular Movements: Extraocular movements intact.  Cardiovascular:     Rate and Rhythm: Normal rate.     Pulses: Normal pulses.  Musculoskeletal:     Cervical back: Tenderness present. No rigidity.     Right lower leg: No edema.     Left lower leg: No edema.     Comments: Patient has good strength in the upper extremities including 5 out of 5 strength in wrist extension long finger flexion and APB.  There is no atrophy of the hands intrinsically.  There is a negative Hoffmann's test.   Lymphadenopathy:     Cervical: No cervical adenopathy.  Skin:    Findings: No erythema, lesion or rash.  Neurological:     General: No focal deficit present.     Mental Status: She is alert and oriented to person, place, and time.     Sensory: No sensory deficit.     Motor: No weakness or abnormal muscle tone.     Coordination: Coordination normal.  Psychiatric:        Mood and Affect: Mood normal.        Behavior: Behavior normal.      Imaging: No results found.

## 2023-06-12 ENCOUNTER — Other Ambulatory Visit: Payer: Self-pay

## 2023-06-12 NOTE — Telephone Encounter (Signed)
 Patient contacted the office and requested refills of Methotrexate and Folic Acid be sent to [facility] on [address]   Last Fill: Methotrexate 10/21/2022 Folic Acid 02/14/2022  Labs: 05/26/2023 CBC and CMP are normal.   Next Visit: 09/02/2023  Last Visit: 04/03/2023  DX:  Rheumatoid arthritis with rheumatoid factor of multiple sites without organ or systems involvement (HCC)   Current Dose per office note 04/03/2023: Methotrexate 0.8 ml sq injections once weekly, folic acid 2 mg by mouth daily.   Okay to refill Methotrexate and Folic Acid?

## 2023-06-13 MED ORDER — FOLIC ACID 1 MG PO TABS: 2.0000 mg | ORAL_TABLET | Freq: Every day | ORAL | 3 refills | Status: AC

## 2023-06-13 MED ORDER — METHOTREXATE SODIUM CHEMO INJECTION (PF) 50 MG/2ML: INTRAMUSCULAR | 2 refills | Status: AC

## 2023-06-25 ENCOUNTER — Ambulatory Visit (INDEPENDENT_AMBULATORY_CARE_PROVIDER_SITE_OTHER): Admitting: Orthopedic Surgery

## 2023-06-25 DIAGNOSIS — M5412 Radiculopathy, cervical region: Secondary | ICD-10-CM | POA: Diagnosis not present

## 2023-06-25 NOTE — Progress Notes (Signed)
 Orthopedic Spine Surgery Office Note   Assessment: Patient is a 63 y.o. female with neck pain that radiates into the left shoulder and lateral arm to the level of the elbow, suspect radiculopathy. Has foraminal stenosis at C4/5     Plan: -Patient has tried PT, home exercise program, Tylenol, medrol dose pak, gabapentin, cervical ESI -Patient did well with injection and got about 80% relief. If this provides her with lasting relief, could consider repeat in the future. -Patient should return to office on an as needed basis     Patient expressed understanding of the plan and all questions were answered to the patient's satisfaction.    ___________________________________________________________________________     History:   Patient is a 63 y.o. female who presents today for follow-up on her cervical spine.  Patient has been doing well since she was last seen in the office.  She got injection with Dr. Daisey Dryer.  She said it provided her with significant relief.  She is not having any more radiating arm pain.  She still notices some popping and crunching sensations in her neck with rotation. It is not all that painful though. Has not developed any new symptoms since she was last seen.    Treatments tried: PT, home exercise program, Tylenol, medrol dose pak, gabapentin, cervical ESI     Physical Exam:   General: no acute distress, appears stated age Neurologic: alert, answering questions appropriately, following commands Respiratory: unlabored breathing on room air, symmetric chest rise Psychiatric: appropriate affect, normal cadence to speech     MSK (spine):   -Strength exam                                                   Left                  Right Grip strength                5/5                  5/5 Interosseus                  5/5                  5/5 Wrist extension            5/5                  5/5 Wrist flexion                 5/5                  5/5 Elbow flexion                 5/5                  5/5 Deltoid                          5/5                  5/5   -Sensory exam                           Sensation intact to light touch  in C5-T1 nerve distributions of bilateral upper extremities   Imaging: XRs of the cervical spine from 04/03/2023 were previously independently reviewed and interpreted, showing disc height loss with anterior osteophyte formation at C4/5, C5/6, C6/7, C7/T1. No fracutre or dislocation seen. Neutral alignment.    XRs of the cervical spine from 04/16/2023 were previously independently reviewed and interpreted, showing disc height loss with anterior osteophyte formation at C4/5, C5/6, C6/7, and C7/T1. No fracture or dislocation seen. No evidence of instability on flexion/extension views.    MRI of the cervical spine from 04/20/2023 was previously independently reviewed and interpreted, showing bilateral foraminal stenosis at C4/5.  No other significant stenosis seen.  No T2 cord signal change.     Patient name: Samantha Hughes Patient MRN: [id number] Date of visit: 06/25/23

## 2023-08-19 NOTE — Progress Notes (Unsigned)
 Office Visit Note  Patient: Samantha Hughes             Date of Birth: 1960/12/21           MRN: [id number]             PCP: Signa Dire, MD (Inactive) Referring: No ref. provider found Visit Date: 09/02/2023 Occupation: @GUAROCC @  Subjective:  Medication monitoring   History of Present Illness: Samantha Hughes is a 63 y.o. female with history of seropositive rheumatoid arthritis and scleritis.  Patient remains on Methotrexate  0.8 ml sq injections once weekly and folic acid  2 mg by mouth daily.  She is tolerating methotrexate  without any side effects or injection site reactions.  She denies any recent or recurrent infections.  Patient states that her neck pain has been more manageable since having an epidural injection performed on 06/04/2023 as well as monthly massages.  She is no longer taking methocarbamol  or gabapentin . Patient states that she recently had a flare involving her hands and left knee joint in early June which she attributes to being under increased stress as well as weather changes.  Patient denies any recurrence since her stress levels have subsided.  She denies any joint swelling at this time.  She denies any recent or recurrent infections.    Activities of Daily Living:  Patient reports morning stiffness for 10-15 minutes.   Patient Denies nocturnal pain.  Difficulty dressing/grooming: Denies Difficulty climbing stairs: Denies Difficulty getting out of chair: Denies Difficulty using hands for taps, buttons, cutlery, and/or writing: Denies  Review of Systems  Constitutional:  Negative for fatigue.  HENT:  Negative for mouth sores and mouth dryness.   Eyes:  Negative for dryness.  Respiratory:  Negative for shortness of breath.   Cardiovascular:  Negative for chest pain and palpitations.  Gastrointestinal:  Positive for constipation. Negative for blood in stool and diarrhea.  Endocrine: Negative for increased urination.  Genitourinary:  Negative for  involuntary urination.  Musculoskeletal:  Positive for joint pain, joint pain, joint swelling, myalgias, muscle weakness, morning stiffness, muscle tenderness and myalgias. Negative for gait problem.  Skin:  Negative for color change, rash, hair loss and sensitivity to sunlight.  Allergic/Immunologic: Negative for susceptible to infections.  Neurological:  Negative for dizziness and headaches.  Hematological:  Negative for swollen glands.  Psychiatric/Behavioral:  Negative for depressed mood and sleep disturbance. The patient is not nervous/anxious.     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   High risk medication use 02/06/2016   Positive PPD, treated 02/06/2016    Past Medical History:  Diagnosis Date   Depression    Osteoarthritis    Rheumatoid arthritis (HCC)    Thyroid  nodule     Family History  Problem Relation Age of Onset   Diabetes Mother    Hypertension Mother    Diabetes Sister    Diabetes Maternal Grandmother    Cancer Maternal Grandfather    Past Surgical History:  Procedure Laterality Date   eye lift     ingrown toenail Bilateral    bilateral great toes   TUBAL LIGATION  Social History   Social History Narrative   Not on file   Immunization History  Administered Date(s) Administered   Moderna Sars-Covid-2 Vaccination 05/17/2019, 06/15/2019, 03/02/2020, 12/08/2020     Objective: Vital Signs: BP 110/75 (BP Location: Left Arm, Patient Position: Sitting, Cuff Size: Normal)   Pulse 76   Resp 16   Ht 5' 6 (1.676 m)   Wt 157 lb 3.2 oz (71.3 kg)   BMI  25.37 kg/m    Physical Exam Vitals and nursing note reviewed.  Constitutional:      Appearance: She is well-developed.  HENT:     Head: Normocephalic and atraumatic.   Eyes:     Conjunctiva/sclera: Conjunctivae normal.    Cardiovascular:     Rate and Rhythm: Normal rate and regular rhythm.     Heart sounds: Normal heart sounds.  Pulmonary:     Effort: Pulmonary effort is normal.     Breath sounds: Normal breath sounds.  Abdominal:     General: Bowel sounds are normal.     Palpations: Abdomen is soft.   Musculoskeletal:     Cervical back: Normal range of motion.  Lymphadenopathy:     Cervical: No cervical adenopathy.   Skin:    General: Skin is warm and dry.     Capillary Refill: Capillary refill takes less than 2 seconds.   Neurological:     Mental Status: She is alert and oriented to person, place, and time.   Psychiatric:        Behavior: Behavior normal.      Musculoskeletal Exam: C-spine has limited range of motion with lateral rotation.  Good flexion extension of the cervical spine.  Shoulder joints, elbow joints, wrist joints of MCPs, PIPs, DIPs have good range of motion with no synovitis.  Synovial thickening of the right second MCP joint.  PIP and DIP thickening.  Complete fist formation bilaterally.  Hip joints have good range of motion with no groin pain.  Right knee joint has no warmth or effusion.  Left knee joint has warmth but no effusion.  Ankle joints have good range of motion with no tenderness or joint swelling.  CDAI Exam: CDAI Score: -- Patient Global: --; Provider Global: -- Swollen: --; Tender: -- Joint Exam 09/02/2023   No joint exam has been documented for this visit   There is currently no information documented on the homunculus. Go to the Rheumatology activity and complete the homunculus joint exam.  Investigation: No additional findings.  Imaging: No results found.  Recent Labs: Lab Results  Component Value Date   WBC 3.6 (L)  08/22/2023   HGB 11.7 08/22/2023   PLT 249 08/22/2023   NA 141 08/22/2023   K 4.3 08/22/2023   CL 107 08/22/2023   CO2 27 08/22/2023   GLUCOSE 65 08/22/2023   BUN 16 08/22/2023   CREATININE 0.84 08/22/2023   BILITOT 0.4 08/22/2023   ALKPHOS 50 10/07/2016   AST 17 08/22/2023   ALT 8 08/22/2023   PROT 6.6 08/22/2023   ALBUMIN 4.2 10/07/2016   CALCIUM 9.3 08/22/2023   GFRAA 97 07/18/2020   QFTBGOLDPLUS POSITIVE (A) 02/12/2018    Speciality Comments: Humira  (March 2022 to 10/09/20 - stopped d/t injection site reaction), changed to Enbrel  on 10/25/20  Procedures:  No procedures performed Allergies: Arava [leflunomide], Plaquenil [hydroxychloroquine sulfate], and Humira  [adalimumab ]    Assessment / Plan:     Visit Diagnoses: Rheumatoid arthritis with rheumatoid factor of multiple sites without organ or systems involvement (HCC):  She has no joint tenderness or synovitis on examination today.  She is currently on methotrexate  0.8 mg subcu injections once weekly and folic acid  2 mg daily.  She has been off of Enbrel  since early October 2024 due to loss of insurance.  Overall she is clinically been doing well on methotrexate  as monotherapy.  She has not had any signs or symptoms of a scleritis flare.  She recently had a flare involving both hands and the left knee joint which he attributes to being under increased stress.  On examination today she has no active synovitis.  Her symptoms have improved since her stress levels have subsided.  Overall she has found methotrexate  to be effective at managing her symptoms and does not want to make any medication changes at this time.  She was advised to notify us  if she starts to have recurrent flares.  She will follow-up in the office in 5 months or sooner if needed.  Association of heart disease with rheumatoid arthritis was discussed. Need to monitor blood pressure, cholesterol, and to exercise 30-60 minutes on daily basis was discussed.   Rheumatoid  nodulosis (HCC): No nodules noted.  High risk medication use - Methotrexate  0.8 ml sq injections once weekly and folic acid  2 mg by mouth daily. Discontinued Enbrel  early October 2024-loss of insurance. CBC and CMP updated on 08/22/23.  Her next lab work will be due in September and every 3 months.  Standing orders for CBC and CMP remain in place. No recent or recurrent infections.  Discussed the importance of holding methotrexate  if she develops signs or symptoms of an infection and to resume once the infection has completely cleared.    Positive PPD, treated - Chest x-ray did not reveal any acute abnormality on 06/26/2022.  Bilateral scleritis: No signs or symptoms of a scleritis flare.  She is not experiencing eye pain, photophobia, or conjunctival injection at this time.  She will remain on methotrexate  as monotherapy.  Discoid lupus: No recurrence.  Neck pain - She had an epidural injection performed on 06/04/2023 which has provided significant relief.  She has been going for monthly massages which have been helpful.  C-spine has slightly limited range of motion without rotation.  Full flexion extension of the cervical spine noted.  Trapezius muscle spasm - Inadequate response to Robaxin .  Improved since going for monthly massages.  Primary osteoarthritis of both hands: PIP and DIP thickening consistent with osteoarthritis of both hands.  Complete fist formation bilaterally.  Paresthesias in left hand: Not currently symptomatic.  Primary osteoarthritis of both knees: Patient recently had a flare involving the left knee.  Her symptoms have started to subside but she continues to have mild warmth in the left knee.  No effusion was noted.  The right knee has full range of motion with no warmth or effusion.  She is advised to notify us  if her symptoms persist or worsen.  Primary osteoarthritis of both feet: She is not experiencing any increased discomfort in her feet at this time.  She has good  range of motion of both ankle joints with no tenderness or joint swelling.  Other medical conditions are listed as follows:  History of anemia  Smoker  Thyroid  nodule  Orders: No orders of the defined types were placed in this encounter.  No orders of the defined types were placed in this encounter.   Follow-Up Instructions: Return in about 5 months (around 02/02/2024) for Rheumatoid arthritis.   Waddell CHRISTELLA Craze, PA-C  Note - This record has been created using [employer].  Chart creation errors have been sought, but may not always  have been located. Such creation errors do not reflect on  the standard of medical care.

## 2023-08-22 ENCOUNTER — Other Ambulatory Visit: Payer: Self-pay | Admitting: *Deleted

## 2023-08-22 DIAGNOSIS — Z79899 Other long term (current) drug therapy: Secondary | ICD-10-CM

## 2023-08-23 LAB — CBC WITH DIFFERENTIAL/PLATELET
Absolute Lymphocytes: 1580 {cells}/uL (ref 850–3900)
Absolute Monocytes: 328 {cells}/uL (ref 200–950)
Basophils Absolute: 50 {cells}/uL (ref 0–200)
Basophils Relative: 1.4 %
Eosinophils Absolute: 259 {cells}/uL (ref 15–500)
Eosinophils Relative: 7.2 %
HCT: 36.2 % (ref 35.0–45.0)
Hemoglobin: 11.7 g/dL (ref 11.7–15.5)
MCH: 30.4 pg (ref 27.0–33.0)
MCHC: 32.3 g/dL (ref 32.0–36.0)
MCV: 94 fL (ref 80.0–100.0)
MPV: 10.3 fL (ref 7.5–12.5)
Monocytes Relative: 9.1 %
Neutro Abs: 1382 {cells}/uL — ABNORMAL LOW (ref 1500–7800)
Neutrophils Relative %: 38.4 %
Platelets: 249 10*3/uL (ref 140–400)
RBC: 3.85 10*6/uL (ref 3.80–5.10)
RDW: 12.9 % (ref 11.0–15.0)
Total Lymphocyte: 43.9 %
WBC: 3.6 10*3/uL — ABNORMAL LOW (ref 3.8–10.8)

## 2023-08-23 LAB — COMPREHENSIVE METABOLIC PANEL WITH GFR
AG Ratio: 1.6 (calc) (ref 1.0–2.5)
ALT: 8 U/L (ref 6–29)
AST: 17 U/L (ref 10–35)
Albumin: 4.1 g/dL (ref 3.6–5.1)
Alkaline phosphatase (APISO): 48 U/L (ref 37–153)
BUN: 16 mg/dL (ref 7–25)
CO2: 27 mmol/L (ref 20–32)
Calcium: 9.3 mg/dL (ref 8.6–10.4)
Chloride: 107 mmol/L (ref 98–110)
Creat: 0.84 mg/dL (ref 0.50–1.05)
Globulin: 2.5 g/dL (ref 1.9–3.7)
Glucose, Bld: 65 mg/dL (ref 65–99)
Potassium: 4.3 mmol/L (ref 3.5–5.3)
Sodium: 141 mmol/L (ref 135–146)
Total Bilirubin: 0.4 mg/dL (ref 0.2–1.2)
Total Protein: 6.6 g/dL (ref 6.1–8.1)
eGFR: 79 mL/min/{1.73_m2} (ref >=60)

## 2023-08-24 ENCOUNTER — Ambulatory Visit: Payer: Self-pay | Admitting: Rheumatology

## 2023-08-24 NOTE — Progress Notes (Signed)
 CMP normal, White cell count is low due to immunosuppression. Recheck labs in 3 months.

## 2023-09-02 ENCOUNTER — Encounter: Payer: Self-pay | Admitting: Physician Assistant

## 2023-09-02 ENCOUNTER — Ambulatory Visit: Payer: Managed Care, Other (non HMO) | Attending: Physician Assistant | Admitting: Physician Assistant

## 2023-09-02 VITALS — BP 110/75 | HR 76 | Resp 16 | Ht 66.0 in | Wt 157.2 lb

## 2023-09-02 DIAGNOSIS — M0579 Rheumatoid arthritis with rheumatoid factor of multiple sites without organ or systems involvement: Secondary | ICD-10-CM

## 2023-09-02 DIAGNOSIS — H15003 Unspecified scleritis, bilateral: Secondary | ICD-10-CM

## 2023-09-02 DIAGNOSIS — M542 Cervicalgia: Secondary | ICD-10-CM

## 2023-09-02 DIAGNOSIS — M19071 Primary osteoarthritis, right ankle and foot: Secondary | ICD-10-CM

## 2023-09-02 DIAGNOSIS — Z79899 Other long term (current) drug therapy: Secondary | ICD-10-CM

## 2023-09-02 DIAGNOSIS — M170 Bilateral primary osteoarthritis of knee: Secondary | ICD-10-CM

## 2023-09-02 DIAGNOSIS — M19041 Primary osteoarthritis, right hand: Secondary | ICD-10-CM

## 2023-09-02 DIAGNOSIS — M0630 Rheumatoid nodule, unspecified site: Secondary | ICD-10-CM

## 2023-09-02 DIAGNOSIS — Z862 Personal history of diseases of the blood and blood-forming organs and certain disorders involving the immune mechanism: Secondary | ICD-10-CM

## 2023-09-02 DIAGNOSIS — R7611 Nonspecific reaction to tuberculin skin test without active tuberculosis: Secondary | ICD-10-CM | POA: Diagnosis not present

## 2023-09-02 DIAGNOSIS — M62838 Other muscle spasm: Secondary | ICD-10-CM

## 2023-09-02 DIAGNOSIS — R202 Paresthesia of skin: Secondary | ICD-10-CM

## 2023-09-02 DIAGNOSIS — M19042 Primary osteoarthritis, left hand: Secondary | ICD-10-CM

## 2023-09-02 DIAGNOSIS — L930 Discoid lupus erythematosus: Secondary | ICD-10-CM

## 2023-09-02 DIAGNOSIS — M19072 Primary osteoarthritis, left ankle and foot: Secondary | ICD-10-CM

## 2023-09-02 DIAGNOSIS — E041 Nontoxic single thyroid nodule: Secondary | ICD-10-CM

## 2023-09-02 DIAGNOSIS — F17200 Nicotine dependence, unspecified, uncomplicated: Secondary | ICD-10-CM

## 2023-09-02 NOTE — Patient Instructions (Signed)
 Standing Labs We placed an order today for your standing lab work.   Please have your standing labs drawn in mid-September and every 3 months  Please have your labs drawn 2 weeks prior to your appointment so that the provider can discuss your lab results at your appointment, if possible.  Please note that you may see your imaging and lab results in MyChart before we have reviewed them. We will contact you once all results are reviewed. Please allow our office up to 72 hours to thoroughly review all of the results before contacting the office for clarification of your results.  WALK-IN LAB HOURS  Monday through Thursday from 8:00 am -12:30 pm and 1:00 pm-4:00 pm and Friday from 8:00 am-12:00 pm.  Patients with office visits requiring labs will be seen before walk-in labs.  You may encounter longer than normal wait times. Please allow additional time. Wait times may be shorter on  Monday and Thursday afternoons.  We do not book appointments for walk-in labs. We appreciate your patience and understanding with our staff.   Labs are drawn by [facility]. Please bring your co-pay at the time of your lab draw.  You may receive a bill from [facility] for your lab work.  Please note if you are on Hydroxychloroquine and and an order has been placed for a Hydroxychloroquine level,  you will need to have it drawn 4 hours or more after your last dose.  If you wish to have your labs drawn at another location, please call the office 24 hours in advance so we can fax the orders.  The office is located at [address], [address], [place], [place] [address]   If you have any questions regarding directions or hours of operation,  please call [phone].   As a reminder, please drink plenty of water prior to coming for your lab work. Thanks!

## 2023-09-15 NOTE — Progress Notes (Unsigned)
 Office Visit Note  Patient: Samantha Hughes             Date of Birth: 11-Apr-1960           MRN: [id number]             PCP: Signa Dire, MD (Inactive) Referring: No ref. provider found Visit Date: 09/16/2023 Occupation: @GUAROCC @  Subjective:  Left knee pain   History of Present Illness: KATALIA CHOMA is a 63 y.o. female with history of rheumatoid arthritis.  Patient is currently taking   Methotrexate  0.8 ml sq injections once weekly and folic acid  2 mg by mouth daily.   CBC and CMP updated on 08/22/23.  Her next lab work will be due in September and every 3 months.   Discussed the importance of holding methotrexate  if she develops signs or symptoms of an infection and to resume once the infection has completely cleared.     Activities of Daily Living:  Patient reports morning stiffness for *** {minute/hour:19697}.   Patient {ACTIONS;DENIES/REPORTS:21021675::Denies} nocturnal pain.  Difficulty dressing/grooming: {ACTIONS;DENIES/REPORTS:21021675::Denies} Difficulty climbing stairs: {ACTIONS;DENIES/REPORTS:21021675::Denies} Difficulty getting out of chair: {ACTIONS;DENIES/REPORTS:21021675::Denies} Difficulty using hands for taps, buttons, cutlery, and/or writing: {ACTIONS;DENIES/REPORTS:21021675::Denies}  No Rheumatology ROS completed.   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   High risk medication use 02/06/2016   Positive PPD, treated 02/06/2016    Past Medical History:  Diagnosis Date   Depression    Osteoarthritis    Rheumatoid arthritis (HCC)    Thyroid   nodule     Family History  Problem Relation Age of Onset   Diabetes Mother    Hypertension Mother    Diabetes Sister    Diabetes Maternal Grandmother    Cancer Maternal Grandfather    Past Surgical History:  Procedure Laterality Date   eye lift     ingrown toenail Bilateral    bilateral great toes   TUBAL LIGATION     Social History   Social History Narrative   Not on file   Immunization History  Administered Date(s) Administered   Moderna Sars-Covid-2 Vaccination 05/17/2019, 06/15/2019, 03/02/2020, 12/08/2020     Objective: Vital Signs: There were no vitals taken for this visit.   Physical Exam Vitals and nursing note reviewed.  Constitutional:      Appearance: She is well-developed.  HENT:     Head: Normocephalic and atraumatic.  Eyes:     Conjunctiva/sclera: Conjunctivae normal.  Cardiovascular:     Rate and Rhythm: Normal rate and regular rhythm.     Heart sounds: Normal heart sounds.  Pulmonary:     Effort: Pulmonary effort is normal.     Breath sounds: Normal breath sounds.  Abdominal:     General: Bowel sounds are normal.     Palpations: Abdomen is soft.  Musculoskeletal:     Cervical back: Normal range of motion.  Lymphadenopathy:     Cervical: No cervical adenopathy.  Skin:    General: Skin is warm and dry.     Capillary Refill: Capillary refill takes less than 2 seconds.  Neurological:     Mental Status: She is alert  and oriented to person, place, and time.  Psychiatric:        Behavior: Behavior normal.      Musculoskeletal Exam: ***  CDAI Exam: CDAI Score: -- Patient Global: --; Provider Global: -- Swollen: --; Tender: -- Joint Exam 09/16/2023   No joint exam has been documented for this visit   There is currently no information documented on the homunculus. Go to the Rheumatology activity and complete the homunculus joint exam.  Investigation: No additional findings.  Imaging: No results found.  Recent Labs: Lab Results   Component Value Date   WBC 3.6 (L) 08/22/2023   HGB 11.7 08/22/2023   PLT 249 08/22/2023   NA 141 08/22/2023   K 4.3 08/22/2023   CL 107 08/22/2023   CO2 27 08/22/2023   GLUCOSE 65 08/22/2023   BUN 16 08/22/2023   CREATININE 0.84 08/22/2023   BILITOT 0.4 08/22/2023   ALKPHOS 50 10/07/2016   AST 17 08/22/2023   ALT 8 08/22/2023   PROT 6.6 08/22/2023   ALBUMIN 4.2 10/07/2016   CALCIUM 9.3 08/22/2023   GFRAA 97 07/18/2020   QFTBGOLDPLUS POSITIVE (A) 02/12/2018    Speciality Comments: Humira  (March 2022 to 10/09/20 - stopped d/t injection site reaction), changed to Enbrel  on 10/25/20  Procedures:  No procedures performed Allergies: Arava [leflunomide], Plaquenil [hydroxychloroquine sulfate], and Humira  [adalimumab ]   Assessment / Plan:     Visit Diagnoses: No diagnosis found.  Orders: No orders of the defined types were placed in this encounter.  No orders of the defined types were placed in this encounter.   Face-to-face time spent with patient was *** minutes. Greater than 50% of time was spent in counseling and coordination of care.  Follow-Up Instructions: No follow-ups on file.   Waddell CHRISTELLA Craze, PA-C  Note - This record has been created using Dragon software.  Chart creation errors have been sought, but may not always  have been located. Such creation errors do not reflect on  the standard of medical care.

## 2023-09-16 ENCOUNTER — Ambulatory Visit (INDEPENDENT_AMBULATORY_CARE_PROVIDER_SITE_OTHER)

## 2023-09-16 ENCOUNTER — Encounter: Payer: Self-pay | Admitting: Physician Assistant

## 2023-09-16 ENCOUNTER — Ambulatory Visit: Attending: Physician Assistant | Admitting: Physician Assistant

## 2023-09-16 VITALS — BP 94/63 | HR 81 | Resp 12 | Ht 66.0 in | Wt 186.0 lb

## 2023-09-16 DIAGNOSIS — R202 Paresthesia of skin: Secondary | ICD-10-CM

## 2023-09-16 DIAGNOSIS — Z79899 Other long term (current) drug therapy: Secondary | ICD-10-CM | POA: Diagnosis not present

## 2023-09-16 DIAGNOSIS — G8929 Other chronic pain: Secondary | ICD-10-CM

## 2023-09-16 DIAGNOSIS — Z862 Personal history of diseases of the blood and blood-forming organs and certain disorders involving the immune mechanism: Secondary | ICD-10-CM

## 2023-09-16 DIAGNOSIS — M19042 Primary osteoarthritis, left hand: Secondary | ICD-10-CM

## 2023-09-16 DIAGNOSIS — H15003 Unspecified scleritis, bilateral: Secondary | ICD-10-CM

## 2023-09-16 DIAGNOSIS — M0630 Rheumatoid nodule, unspecified site: Secondary | ICD-10-CM

## 2023-09-16 DIAGNOSIS — F17200 Nicotine dependence, unspecified, uncomplicated: Secondary | ICD-10-CM

## 2023-09-16 DIAGNOSIS — M62838 Other muscle spasm: Secondary | ICD-10-CM

## 2023-09-16 DIAGNOSIS — M19071 Primary osteoarthritis, right ankle and foot: Secondary | ICD-10-CM

## 2023-09-16 DIAGNOSIS — M19072 Primary osteoarthritis, left ankle and foot: Secondary | ICD-10-CM

## 2023-09-16 DIAGNOSIS — M0579 Rheumatoid arthritis with rheumatoid factor of multiple sites without organ or systems involvement: Secondary | ICD-10-CM | POA: Diagnosis not present

## 2023-09-16 DIAGNOSIS — R7611 Nonspecific reaction to tuberculin skin test without active tuberculosis: Secondary | ICD-10-CM

## 2023-09-16 DIAGNOSIS — L930 Discoid lupus erythematosus: Secondary | ICD-10-CM

## 2023-09-16 DIAGNOSIS — M25562 Pain in left knee: Secondary | ICD-10-CM

## 2023-09-16 DIAGNOSIS — M170 Bilateral primary osteoarthritis of knee: Secondary | ICD-10-CM

## 2023-09-16 DIAGNOSIS — M19041 Primary osteoarthritis, right hand: Secondary | ICD-10-CM

## 2023-09-16 DIAGNOSIS — M542 Cervicalgia: Secondary | ICD-10-CM

## 2023-09-16 DIAGNOSIS — E041 Nontoxic single thyroid nodule: Secondary | ICD-10-CM

## 2023-09-16 MED ORDER — LIDOCAINE HCL 1 % IJ SOLN
1.5000 mL | INTRAMUSCULAR | Status: AC | PRN
  Administered 2023-09-16: 1.5 mL

## 2023-09-16 MED ORDER — TRIAMCINOLONE ACETONIDE 40 MG/ML IJ SUSP
40.0000 mg | INTRAMUSCULAR | Status: AC | PRN
  Administered 2023-09-16: 40 mg via INTRA_ARTICULAR

## 2023-09-16 NOTE — Patient Instructions (Signed)
 Standing Labs We placed an order today for your standing lab work.   Please have your standing labs drawn in Mid-September and every 3 months   Please have your labs drawn 2 weeks prior to your appointment so that the provider can discuss your lab results at your appointment, if possible.  Please note that you may see your imaging and lab results in MyChart before we have reviewed them. We will contact you once all results are reviewed. Please allow our office up to 72 hours to thoroughly review all of the results before contacting the office for clarification of your results.  WALK-IN LAB HOURS  Monday through Thursday from 8:00 am -12:30 pm and 1:00 pm-4:30 pm and Friday from 8:00 am-12:00 pm.  Patients with office visits requiring labs will be seen before walk-in labs.  You may encounter longer than normal wait times. Please allow additional time. Wait times may be shorter on  Monday and Thursday afternoons.  We do not book appointments for walk-in labs. We appreciate your patience and understanding with our staff.   Labs are drawn by [facility]. Please bring your co-pay at the time of your lab draw.  You may receive a bill from [facility] for your lab work.  Please note if you are on Hydroxychloroquine and and an order has been placed for a Hydroxychloroquine level,  you will need to have it drawn 4 hours or more after your last dose.  If you wish to have your labs drawn at another location, please call the office 24 hours in advance so we can fax the orders.  The office is located at [address], [address], [place], [place] [address]   If you have any questions regarding directions or hours of operation,  please call [phone].   As a reminder, please drink plenty of water prior to coming for your lab work. Thanks!

## 2023-10-03 ENCOUNTER — Ambulatory Visit: Attending: Rheumatology | Admitting: Rheumatology

## 2023-10-03 ENCOUNTER — Telehealth: Payer: Self-pay

## 2023-10-03 ENCOUNTER — Encounter: Payer: Self-pay | Admitting: Rheumatology

## 2023-10-03 VITALS — BP 116/78 | HR 67 | Resp 16 | Ht 66.0 in | Wt 189.0 lb

## 2023-10-03 DIAGNOSIS — M0579 Rheumatoid arthritis with rheumatoid factor of multiple sites without organ or systems involvement: Secondary | ICD-10-CM | POA: Diagnosis not present

## 2023-10-03 DIAGNOSIS — R7611 Nonspecific reaction to tuberculin skin test without active tuberculosis: Secondary | ICD-10-CM

## 2023-10-03 DIAGNOSIS — M542 Cervicalgia: Secondary | ICD-10-CM

## 2023-10-03 DIAGNOSIS — M25462 Effusion, left knee: Secondary | ICD-10-CM | POA: Diagnosis not present

## 2023-10-03 DIAGNOSIS — F17200 Nicotine dependence, unspecified, uncomplicated: Secondary | ICD-10-CM

## 2023-10-03 DIAGNOSIS — M19071 Primary osteoarthritis, right ankle and foot: Secondary | ICD-10-CM

## 2023-10-03 DIAGNOSIS — M19041 Primary osteoarthritis, right hand: Secondary | ICD-10-CM

## 2023-10-03 DIAGNOSIS — Z79899 Other long term (current) drug therapy: Secondary | ICD-10-CM

## 2023-10-03 DIAGNOSIS — M62838 Other muscle spasm: Secondary | ICD-10-CM

## 2023-10-03 DIAGNOSIS — E041 Nontoxic single thyroid nodule: Secondary | ICD-10-CM

## 2023-10-03 DIAGNOSIS — M19072 Primary osteoarthritis, left ankle and foot: Secondary | ICD-10-CM

## 2023-10-03 DIAGNOSIS — M0630 Rheumatoid nodule, unspecified site: Secondary | ICD-10-CM

## 2023-10-03 DIAGNOSIS — M25562 Pain in left knee: Secondary | ICD-10-CM

## 2023-10-03 DIAGNOSIS — M170 Bilateral primary osteoarthritis of knee: Secondary | ICD-10-CM

## 2023-10-03 DIAGNOSIS — Z862 Personal history of diseases of the blood and blood-forming organs and certain disorders involving the immune mechanism: Secondary | ICD-10-CM

## 2023-10-03 DIAGNOSIS — H15003 Unspecified scleritis, bilateral: Secondary | ICD-10-CM

## 2023-10-03 DIAGNOSIS — G8929 Other chronic pain: Secondary | ICD-10-CM

## 2023-10-03 DIAGNOSIS — L930 Discoid lupus erythematosus: Secondary | ICD-10-CM

## 2023-10-03 DIAGNOSIS — M19042 Primary osteoarthritis, left hand: Secondary | ICD-10-CM

## 2023-10-03 LAB — SYNOVIAL FLUID ANALYSIS, COMPLETE
Basophils, %: 0 %
Eosinophils-Synovial: 0 % (ref 0–2)
Lymphocytes-Synovial Fld: 12 % (ref 0–74)
Monocyte/Macrophage: 66 % (ref 0–69)
Neutrophil, Synovial: 22 % (ref 0–24)
Synoviocytes, %: 0 % (ref 0–15)
WBC, Synovial: 228 {cells}/uL — ABNORMAL HIGH (ref <150)

## 2023-10-03 MED ORDER — PREDNISONE 5 MG PO TABS: ORAL_TABLET | ORAL | 0 refills | Status: AC

## 2023-10-03 MED ORDER — LIDOCAINE HCL 1 % IJ SOLN
5.0000 mL | INTRAMUSCULAR | Status: AC | PRN
Start: 2023-10-03 — End: 2023-10-03
  Administered 2023-10-03: 5 mL

## 2023-10-03 NOTE — Patient Instructions (Signed)
 Etanercept  Injection What is this medication? ETANERCEPT  (et a [employer]) treats autoimmune conditions, such as psoriasis and certain types of arthritis. It works by slowing down an overactive immune system. It belongs to a group of medications called TNF inhibitors. This medicine may be used for other purposes; ask your health care provider or pharmacist if you have questions. COMMON BRAND NAME(S): Enbrel  What should I tell my care team before I take this medication? They need to know if you have any of these conditions: Bleeding disorder Cancer Diabetes Granulomatosis with polyangiitis Heart failure HIV or AIDS Immune system problems Infection, such as tuberculosis (TB) or other bacterial, fungal or viral infections Liver disease Nervous system problems, such as Guillain-Barre syndrome, multiple sclerosis or seizures Recent or upcoming vaccine An unusual or allergic reaction to etanercept , other medications, food, dyes, or preservatives Pregnant or trying to get pregnant Breastfeeding How should I use this medication? The medication is injected under the skin. You will be taught how to prepare and give it. Take it as directed on the prescription label. Keep taking it unless your care team tells you stop. This medication comes with INSTRUCTIONS FOR USE. Ask your pharmacist for directions on how to use this medication. Read the information carefully. Talk to your pharmacist or care team if you have questions. If you use a pen, be sure to take off the outer needle cover before using the dose. It is important that you put your used needles and syringes in a special sharps container. Do not put them in a trash can. If you do not have a sharps container, call your pharmacist or care team to get one. A special MedGuide will be given to you by the pharmacist with each prescription and refill. Be sure to read this information carefully each time. Talk to your care team about the use of this  medication in children. While it may be prescribed for children as young as 72 years of age for selected conditions, precautions do apply. Overdosage: If you think you have taken too much of this medicine contact a poison control center or emergency room at once. NOTE: This medicine is only for you. Do not share this medicine with others. What if I miss a dose? If you miss a dose, take it as soon as you can. If it is almost time for your next dose, take only that dose. Do not take double or extra doses. What may interact with this medication? Do not take this medication with any of the following: Biologic medications, such as adalimumab , certolizumab, golimumab, infliximab Live vaccines Rilonacept This medication may also interact with the following: Abatacept Anakinra Biologic medications, such as anifrolumab, baricitinib, belimumab, canakinumab, natalizumab, rituximab, sarilumab, tocilizumab, tofacitinib, upadacitinib, vedolizumab Cyclophosphamide Sulfasalazine  This list may not describe all possible interactions. Give your health care provider a list of all the medicines, herbs, non-prescription drugs, or dietary supplements you use. Also tell them if you smoke, drink alcohol, or use illegal drugs. Some items may interact with your medicine. What should I watch for while using this medication? Visit your care team for regular checks on your progress. Tell your care team if your symptoms do not start to get better or if they get worse. This medication may increase your risk of getting an infection. Call your care team for advice if you get a fever, chills, sore throat, or other symptoms of a cold or flu. Do not treat yourself. Try to avoid being around people who are sick. If  you have not had the measles or chickenpox vaccines, tell your care team right away if you are around someone with these viruses. You will be tested for tuberculosis (TB) before you start this medication. If your care team  prescribes any medication for TB, you should start taking the TB medication before starting this medication. Make sure to finish the full course of TB medication. Avoid taking medications that contain aspirin, acetaminophen , ibuprofen, naproxen, or ketoprofen unless instructed by your care team. These medications may hide fever. Talk to your care team about your risk of cancer. You may be more at risk for certain types of cancer if you take this medication. This medication can decrease the response to a vaccine. If you need to get vaccinated, tell your care team if you have received this medication. Extra booster doses may be needed. Talk to your care team to see if a different vaccination schedule is needed. What side effects may I notice from receiving this medication? Side effects that you should report to your care team as soon as possible: Allergic reactions--skin rash, itching, hives, swelling of the face, lips, tongue, or throat Body pain, tingling, or numbness Eye pain, change in vision, vision loss Heart failure--shortness of breath, swelling of the ankles, feet, or hands, sudden weight gain, unusual weakness or fatigue Infection--fever, chills, cough, sore throat, wounds that don't heal, pain or trouble when passing urine, general feeling of discomfort or being unwell Liver injury--right upper belly pain, loss of appetite, nausea, light-colored stool, dark yellow or brown urine, yellowing skin or eyes, unusual weakness or fatigue Low red blood cell level--unusual weakness or fatigue, dizziness, headache, trouble breathing Lupus-like syndrome--joint pain, swelling, or stiffness, butterfly-shaped rash on the face, rashes that get worse in the sun, fever, unusual weakness or fatigue New or worsening psoriasis--rash with itchy, scaly patches Seizures Unusual bruising or bleeding Weakness in arms and legs Side effects that usually do not require medical attention (report to your care team if  they continue or are bothersome): Headache Pain, redness, or irritation at injection site Sinus pain or pressure around the face or forehead This list may not describe all possible side effects. Call your doctor for medical advice about side effects. You may report side effects to FDA at 1-800-FDA-1088. Where should I keep my medication? Keep out of the reach of children and pets. See product for storage information. Each product may have different instructions. Get rid of any unused medication after the expiration date. To get rid of medications that are no longer needed or have expired: Take the medication to a medication take-back program. Check with your pharmacy or law enforcement to find a location. If you cannot return the medication, ask your pharmacist or care team how to get rid of this medication safely. NOTE: This sheet is a summary. It may not cover all possible information. If you have questions about this medicine, talk to your doctor, pharmacist, or health care provider.  2024 Elsevier/Gold Standard (2021-08-22 00:00:00) Standing Labs We placed an order today for your standing lab work.   Please have your standing labs drawn in 1 month after starting Enbrel  and then every 3 months  Please have your labs drawn 2 weeks prior to your appointment so that the provider can discuss your lab results at your appointment, if possible.  Please note that you may see your imaging and lab results in MyChart before we have reviewed them. We will contact you once all results are reviewed. Please allow  our office up to 72 hours to thoroughly review all of the results before contacting the office for clarification of your results.  WALK-IN LAB HOURS  Monday through Thursday from 8:00 am -12:30 pm and 1:00 pm-4:30 pm and Friday from 8:00 am-12:00 pm.  Patients with office visits requiring labs will be seen before walk-in labs.  You may encounter longer than normal wait times. Please allow  additional time. Wait times may be shorter on  Monday and Thursday afternoons.  We do not book appointments for walk-in labs. We appreciate your patience and understanding with our staff.   Labs are drawn by [facility]. Please bring your co-pay at the time of your lab draw.  You may receive a bill from [facility] for your lab work.  Please note if you are on Hydroxychloroquine and and an order has been placed for a Hydroxychloroquine level,  you will need to have it drawn 4 hours or more after your last dose.  If you wish to have your labs drawn at another location, please call the office 24 hours in advance so we can fax the orders.  The office is located at [address], [place], [place] [address]   If you have any questions regarding directions or hours of operation,  please call [phone].   As a reminder, please drink plenty of water prior to coming for your lab work. Thanks!   Vaccines You are taking a medication(s) that can suppress your immune system.  The following immunizations are recommended: Flu annually Covid-19  Td/Tdap (tetanus, diphtheria, pertussis) every 10 years Pneumonia (Prevnar 15 then Pneumovax 23 at least 1 year apart.  Alternatively, can take Prevnar 20 without needing additional dose) Shingrix: 2 doses from 4 weeks to 6 months apart  Please check with your PCP to make sure you are up to date.   If you have signs or symptoms of an infection or start antibiotics: First, call your PCP for workup of your infection. Hold your medication through the infection, until you complete your antibiotics, and until symptoms resolve if you take the following: Injectable medication (Actemra, Benlysta, Cimzia, Cosentyx, Enbrel , Humira , Kevzara, Orencia, Remicade, Simponi, Stelara, Taltz, Tremfya) Methotrexate  Leflunomide (Arava) Mycophenolate (Cellcept) Earma, Olumiant, or Rinvoq  Please get an annual skin examination to screen for skin cancer while you are on  Enbrel .  Please use sunscreen and sun protection.

## 2023-10-03 NOTE — Telephone Encounter (Signed)
 Please start Enbrel  Mini BIV. She is restarting (discontinued in Oct 2024)  Dose: 50mg  subcut every 7 days  Dx: Rheumatoid arthritis (M05.9)  Previously tried therapies: Methotrexate  - current Enbrel  (discontinued due to insurance change but then disease was doing relatively well on MTX monotherapy)  Sherry Pennant, PharmD, MPH, BCPS, CPP Clinical Pharmacist (Rheumatology and Pulmonology)

## 2023-10-03 NOTE — Progress Notes (Signed)
 Office Visit Note  Patient: Samantha Hughes             Date of Birth: [birth year]           MRN: [id number]             PCP: Signa Dire, MD (Inactive) Referring: No ref. provider found Visit Date: 10/03/2023 Occupation: @GUAROCC @  Subjective:  Pain in left knee  History of Present Illness: YZABELLA CRUNK is a 63 y.o. female with seropositive rheumatoid arthritis, scleritis, discoid lupus and osteoarthritis.  She returns today after her last visit on July 2025.  At the time she presented with left knee joint pain and discomfort.  She received a left knee joint cortisone injection.  She states she had temporary relief and then the pain came back.  She continues to have constant pain and discomfort in the left knee.  None of the other joints are painful.  She has been off Enbrel  since October 2024.    Activities of Daily Living:  Patient reports morning stiffness for  all day .   Patient Reports nocturnal pain.  Difficulty dressing/grooming: Denies Difficulty climbing stairs: Reports Difficulty getting out of chair: Reports Difficulty using hands for taps, buttons, cutlery, and/or writing: Denies  Review of Systems  Constitutional:  Positive for fatigue.  HENT:  Negative for mouth sores and mouth dryness.   Eyes:  Negative for dryness.  Respiratory:  Negative for shortness of breath.   Cardiovascular:  Negative for chest pain and palpitations.  Gastrointestinal:  Negative for blood in stool, constipation and diarrhea.  Endocrine: Negative for increased urination.  Genitourinary:  Negative for involuntary urination.  Musculoskeletal:  Positive for joint pain, gait problem, joint pain, joint swelling, myalgias, muscle weakness, morning stiffness, muscle tenderness and myalgias.  Skin:  Negative for color change, rash, hair loss and sensitivity to sunlight.  Allergic/Immunologic: Negative for susceptible to infections.  Neurological:  Negative for dizziness and headaches.   Hematological:  Negative for swollen glands.  Psychiatric/Behavioral:  Positive for sleep disturbance. Negative for depressed mood. The patient is not nervous/anxious.     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   High risk medication use 02/06/2016   Positive PPD, treated 02/06/2016    Past Medical History:  Diagnosis Date   Depression    Osteoarthritis    Rheumatoid arthritis (HCC)    Thyroid  nodule     Family History  Problem Relation Age of Onset   Diabetes Mother    Hypertension Mother    Diabetes Sister    Diabetes Maternal Grandmother    Cancer Maternal Grandfather    Past Surgical History:  Procedure Laterality Date   eye lift     ingrown toenail Bilateral    bilateral great toes   TUBAL LIGATION     Social History   Social History Narrative   Not on file   Immunization History  Administered Date(s) Administered   Moderna Sars-Covid-2 Vaccination 05/17/2019, 06/15/2019, 03/02/2020, 12/08/2020     Objective: Vital Signs: BP 116/78 (BP Location: Left Arm, Patient Position: Sitting, Cuff Size: Normal)   Pulse  67   Resp 16   Ht 5' 6 (1.676 m)   Wt 189 lb (85.7 kg)   BMI 30.51 kg/m    Physical Exam Vitals and nursing note reviewed.  Constitutional:      Appearance: She is well-developed.  HENT:     Head: Normocephalic and atraumatic.  Eyes:     Conjunctiva/sclera: Conjunctivae normal.  Cardiovascular:     Rate and Rhythm: Normal rate and regular rhythm.     Heart sounds: Normal heart sounds.  Pulmonary:     Effort: Pulmonary effort is normal.      Breath sounds: Normal breath sounds.  Abdominal:     General: Bowel sounds are normal.     Palpations: Abdomen is soft.  Musculoskeletal:     Cervical back: Normal range of motion.  Lymphadenopathy:     Cervical: No cervical adenopathy.  Skin:    General: Skin is warm and dry.     Capillary Refill: Capillary refill takes less than 2 seconds.  Neurological:     Mental Status: She is alert and oriented to person, place, and time.  Psychiatric:        Behavior: Behavior normal.      Musculoskeletal Exam: Cervical spine was in good range of motion.  There was no tenderness over thoracic or lumbar spine.  Shoulders, elbows, wrist joints were in good range of motion.  No MCP PIP or DIP synovitis was noted.  Synovial thickening was noted over bilateral second MCP joints.  Hip joints and knee joints were in good range of motion.  She had pain and discomfort with range of motion of her left knee joint.  Moderate effusion and warmth was noted on palpation of her left knee joint.  There was no tenderness over ankles or MTPs.  CDAI Exam: CDAI Score: 10  Patient Global: 40 / 100; Provider Global: 40 / 100 Swollen: 1 ; Tender: 1  Joint Exam 10/03/2023      Right  Left  Knee     Swollen Tender     Investigation: No additional findings.  Imaging: XR KNEE 3 VIEW LEFT Result Date: 09/16/2023 No medial or lateral compartment narrowing was noted.  No patellofemoral narrowing was noted.  No chondrocalcinosis was noted. Impression: Unremarkable x-rays of the knee.   Recent Labs: Lab Results  Component Value Date   WBC 3.6 (L) 08/22/2023   HGB 11.7 08/22/2023   PLT 249 08/22/2023   NA 141 08/22/2023   K 4.3 08/22/2023   CL 107 08/22/2023   CO2 27 08/22/2023   GLUCOSE 65 08/22/2023   BUN 16 08/22/2023   CREATININE 0.84 08/22/2023   BILITOT 0.4 08/22/2023   ALKPHOS 50 10/07/2016   AST 17 08/22/2023   ALT 8 08/22/2023   PROT 6.6 08/22/2023   ALBUMIN 4.2 10/07/2016   CALCIUM 9.3  08/22/2023   GFRAA 97 07/18/2020   QFTBGOLDPLUS POSITIVE (A) 02/12/2018    Speciality Comments: Humira  (March 2022 to 10/09/20 - stopped d/t injection site reaction), changed to Enbrel  on 10/25/20  Procedures:  Large Joint Inj: L knee on 10/03/2023 10:52 AM Indications: pain and joint swelling Details: 27 G 1.5 in needle, lateral approach  Arthrogram: No  Medications: 5 mL lidocaine  1 % Aspirate: 25 mL clear; sent for lab analysis Outcome: tolerated well, no immediate complications  Risk of infection, tendon injury, nerve injury, dermal atrophy, hypopigmentation were discussed. Procedure, treatment alternatives, risks and benefits explained, specific risks discussed. Consent was given by the patient.  Immediately prior to procedure a time out was called to verify the correct patient, procedure, equipment, support staff and site/side marked as required. Patient was prepped and draped in the usual sterile fashion.     Allergies: Arava [leflunomide], Plaquenil [hydroxychloroquine sulfate], and Humira  [adalimumab ]   Assessment / Plan:     Visit Diagnoses: Rheumatoid arthritis with rheumatoid factor of multiple sites without organ or systems involvement (HCC) -patient reports having a flare of severe pain and discomfort in her left knee joint.  She had warmth swelling and effusion in her left knee joint.  She had recent cortisone injection without much relief.  None of the other joints are painful or swollen.  Patient had done well in the past on the combination of Enbrel  and methotrexate .  She has been off Enbrel  since October 2024 due to loss of insurance.  After left knee joint was aspirated she was placed on prednisone  taper starting at 20 mg and taper by 5 mg every week.  I also discussed resuming Enbrel .  Side effects of management was reviewed.  A handout was given and consent was taken.  Will apply for Enbrel .  Plan: predniSONE  (DELTASONE ) 5 MG tablet  Rheumatoid nodulosis (HCC)-she had no  active multiple symptoms today.  High risk medication use-Methotrexate  0.8 ml sq injections once weekly and folic acid  2 mg by mouth daily. Discontinued Enbrel  early October 2024-loss of insurance.  August 22, 2023 CBC and CMP was stable with white cell count low at 3.7.  TB Gold was positive in the past.  She was getting annual chest x-ray.  Last chest x-ray was April 20 22,024.  Positive PPD, treated  Bilateral scleritis-she had no recent episodes of eye inflammation.  Discoid lupus-she denies any skin lesions.  Neck pain-she continues to have some stiffness in her neck.  Trapezius muscle spasm  Primary osteoarthritis of both hands-she bilateral PIP and DIP thickening with no synovitis.  Effusion, left knee -she had moderate effusion in the left knee joint.  After informed consent was obtained per patient's request left knee joint was aspirated.  20 mL of clear synovial fluid was aspirated.  It was sent for analysis.  I placed her on prednisone  taper as she had recent cortisone injection.  Plan: Synovial Fluid Analysis, Complete  Chronic pain of left knee-she had x-rays on August 17, 2023 which were unremarkable.  Primary osteoarthritis of both knees  Primary osteoarthritis of both feet-proper fitting shoes were advised.  Other medical problems are listed as follows:  History of anemia  Thyroid  nodule  Smoker  Orders: Orders Placed This Encounter  Procedures   Large Joint Inj: L knee   Synovial Fluid Analysis, Complete   Meds ordered this encounter  Medications   predniSONE  (DELTASONE ) 5 MG tablet    Sig: Take 4 tabs (20 mg) by mouth for 7 days, then 3 tabs (15 mg) by mouth for 7 days, then 2 tabs (10 mg) by mouth for 7 days, then 1 tab (5 mg) by mouth for 7 days.    Dispense:  70 tablet    Refill:  0    Face-to-face time spent with patient was 45 minutes. Greater than 50% of time was spent in counseling and coordination of care.  Follow-Up Instructions: Return in about 2  months (around 12/04/2023) for Rheumatoid arthritis.   Maya Nash, MD  Note - This record has been created using Animal nutritionist.  Chart creation errors have been sought, but may not always  have been  located. Such creation errors do not reflect on  the standard of medical care.

## 2023-10-04 ENCOUNTER — Ambulatory Visit: Payer: Self-pay | Admitting: Rheumatology

## 2023-10-04 NOTE — Progress Notes (Signed)
Synovial fluid is not inflammatory.  Crystals were negative.

## 2023-10-06 NOTE — Telephone Encounter (Signed)
 Submitted a Prior Authorization request to [employer] for ENBREL  via CoverMyMeds. Will update once we receive a response.  Key: [id number]

## 2023-10-07 ENCOUNTER — Other Ambulatory Visit (HOSPITAL_COMMUNITY): Payer: Self-pay

## 2023-10-07 NOTE — Telephone Encounter (Signed)
 Patient called back regarding Enbrel  approval. Advised patient to get a repeat chest x ray before starting Enbrel . Patient will attempt to get xray this week at [facility]. Orders sent in. Will call patient once results come back to schedule Enbrel  new start appointment.  Deleta Colt PharmD Candidate [phone]  [facility]

## 2023-10-07 NOTE — Telephone Encounter (Signed)
 Patient re-enrolled into Enbrel  copay card: BIN: [id number] PCN: [id number] Group: [id number] ID: [id number]  Sherry Pennant, PharmD, MPH, BCPS, CPP Clinical Pharmacist (Rheumatology and Pulmonology)

## 2023-10-07 NOTE — Telephone Encounter (Signed)
 Called patient regarding Enbrel  approval. Patient in need of an updated chest x-ray. Unable to reach. Left a voicemail.  Advised patient to give us  a call back to set up that chest x-ray and schedule Enbrel  new start appointment. Provided patient with front desk phone number.   Phone: [phone]  Deleta Colt PharmD Candidate [id number]  [facility]

## 2023-10-07 NOTE — Telephone Encounter (Signed)
 Received notification from [facility] [employer] regarding a prior authorization for ENBREL . Authorization has been APPROVED from 10/06/2023 to 10/05/2024. Approval letter sent to scan center.  Unable to run test claim  Patient must fill through [facility]: [phone]  Authorization # [id number]  Patient will need updated chest xray before starting (needs yearly chest xrays due to history of positive TB test in past)  Sherry Pennant, PharmD, MPH, BCPS, CPP Clinical Pharmacist (Rheumatology and Pulmonology)

## 2023-10-08 ENCOUNTER — Ambulatory Visit (HOSPITAL_BASED_OUTPATIENT_CLINIC_OR_DEPARTMENT_OTHER)
Admission: RE | Admit: 2023-10-08 | Discharge: 2023-10-08 | Disposition: A | Discharge status: Home / Self Care | Source: Ambulatory Visit | Attending: Rheumatology | Admitting: Rheumatology

## 2023-10-08 DIAGNOSIS — M0579 Rheumatoid arthritis with rheumatoid factor of multiple sites without organ or systems involvement: Secondary | ICD-10-CM | POA: Diagnosis present

## 2023-10-08 DIAGNOSIS — Z79899 Other long term (current) drug therapy: Secondary | ICD-10-CM | POA: Diagnosis present

## 2023-10-08 DIAGNOSIS — R7611 Nonspecific reaction to tuberculin skin test without active tuberculosis: Secondary | ICD-10-CM | POA: Insufficient documentation

## 2023-10-14 ENCOUNTER — Other Ambulatory Visit (HOSPITAL_COMMUNITY): Payer: Self-pay

## 2023-10-20 ENCOUNTER — Ambulatory Visit: Payer: Self-pay | Admitting: Rheumatology

## 2023-10-20 NOTE — Telephone Encounter (Signed)
 Xray WNL. Patient can be scheduled for Enbrel  restart

## 2023-10-20 NOTE — Telephone Encounter (Signed)
 Patient advised Xray WNL. Patient advised she can be scheduled for Enbrel  restart. Scheduled for 10/22/2023 at 8:30 am.

## 2023-10-20 NOTE — Progress Notes (Signed)
Chest x-ray is unremarkable per radiology report.

## 2023-10-20 NOTE — Telephone Encounter (Signed)
 I called reading room, pushing x-ray to rad to be read.

## 2023-10-21 IMAGING — CR DG CHEST 2V
2 series · 2 of 2 positions shown · non-contrast
Comparison: Chest two views 05/17/2020

CLINICAL DATA: History of positive PPD. No chest complaints.
Immunosuppressive therapy.

EXAM:
CHEST - 2 VIEW

[w chest pa]
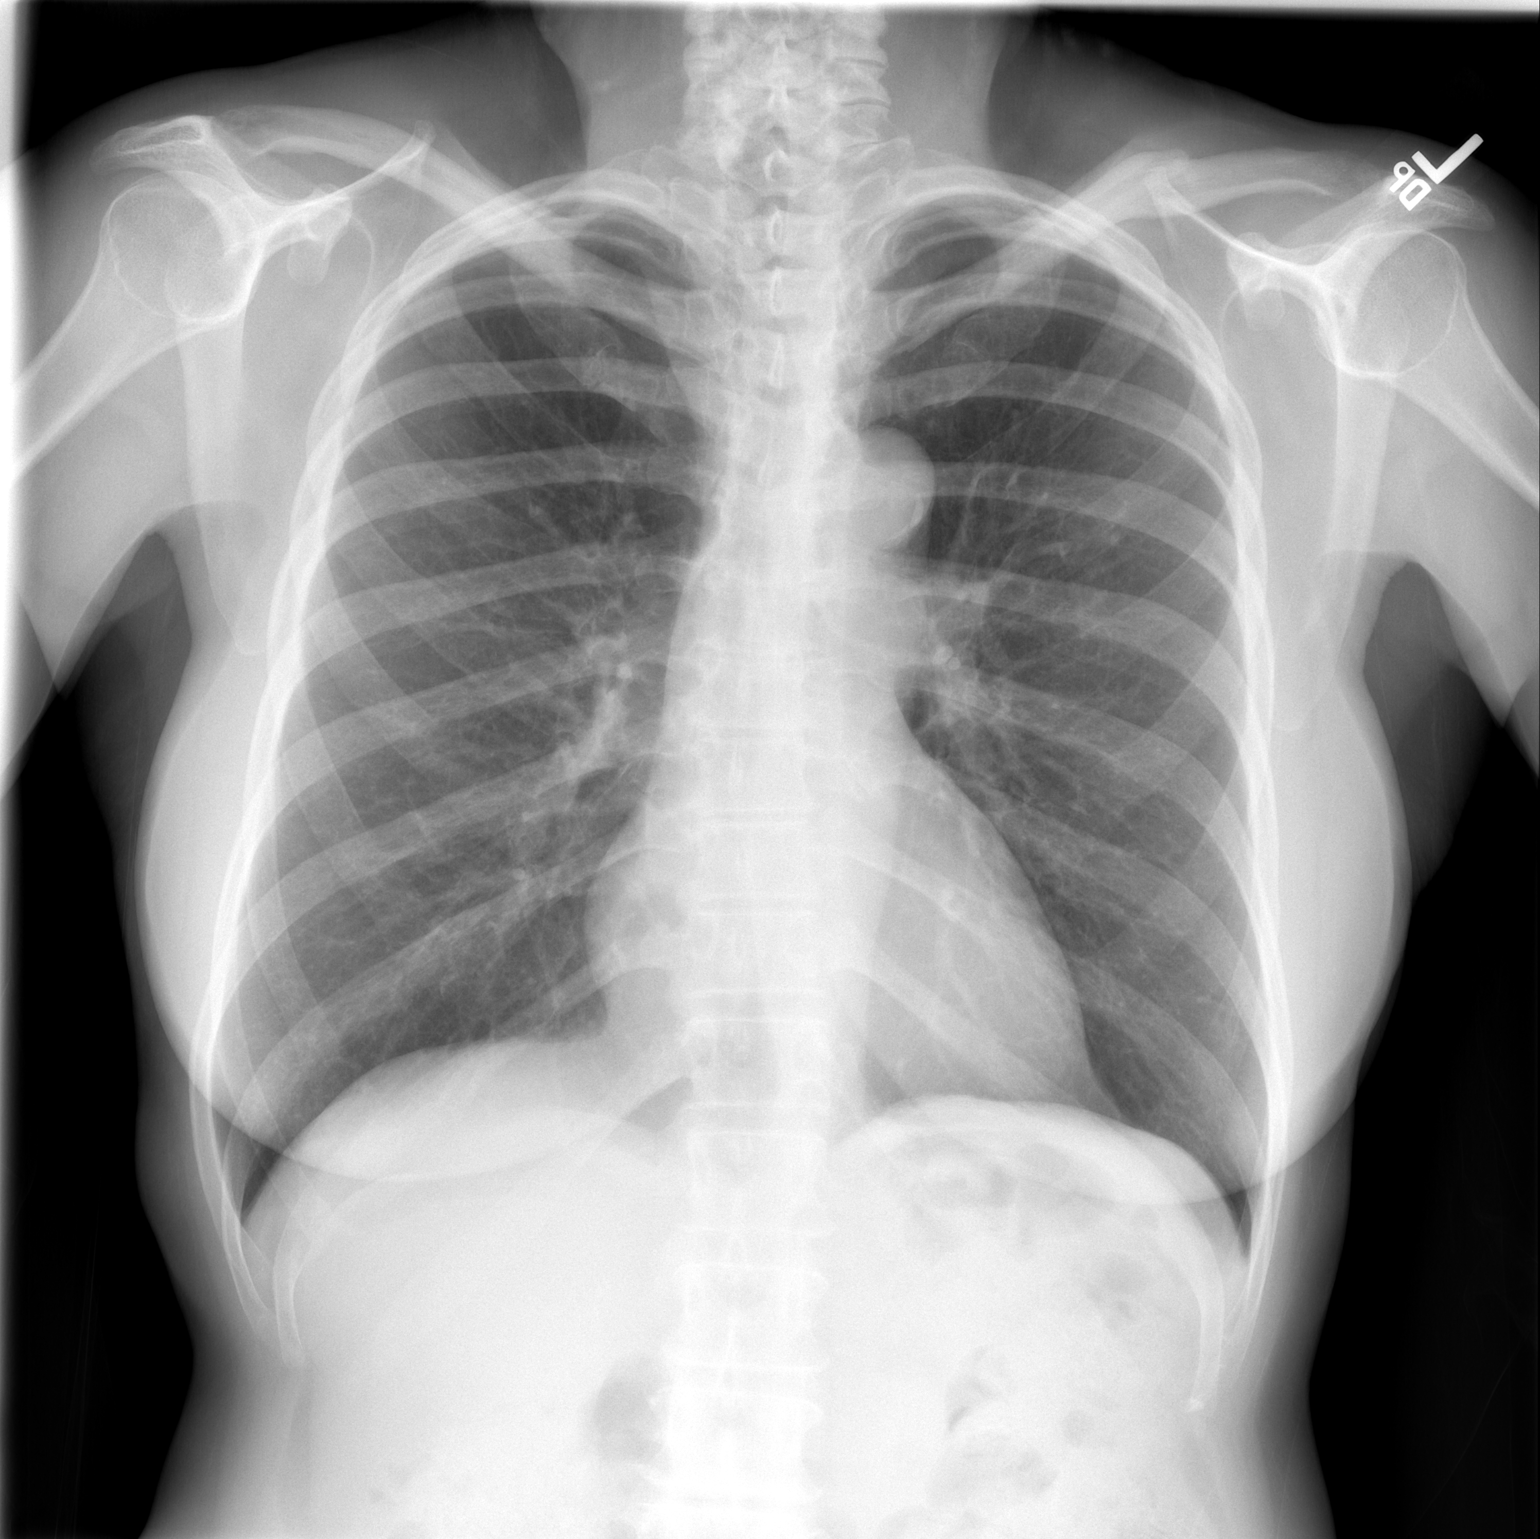

[w chest lat]
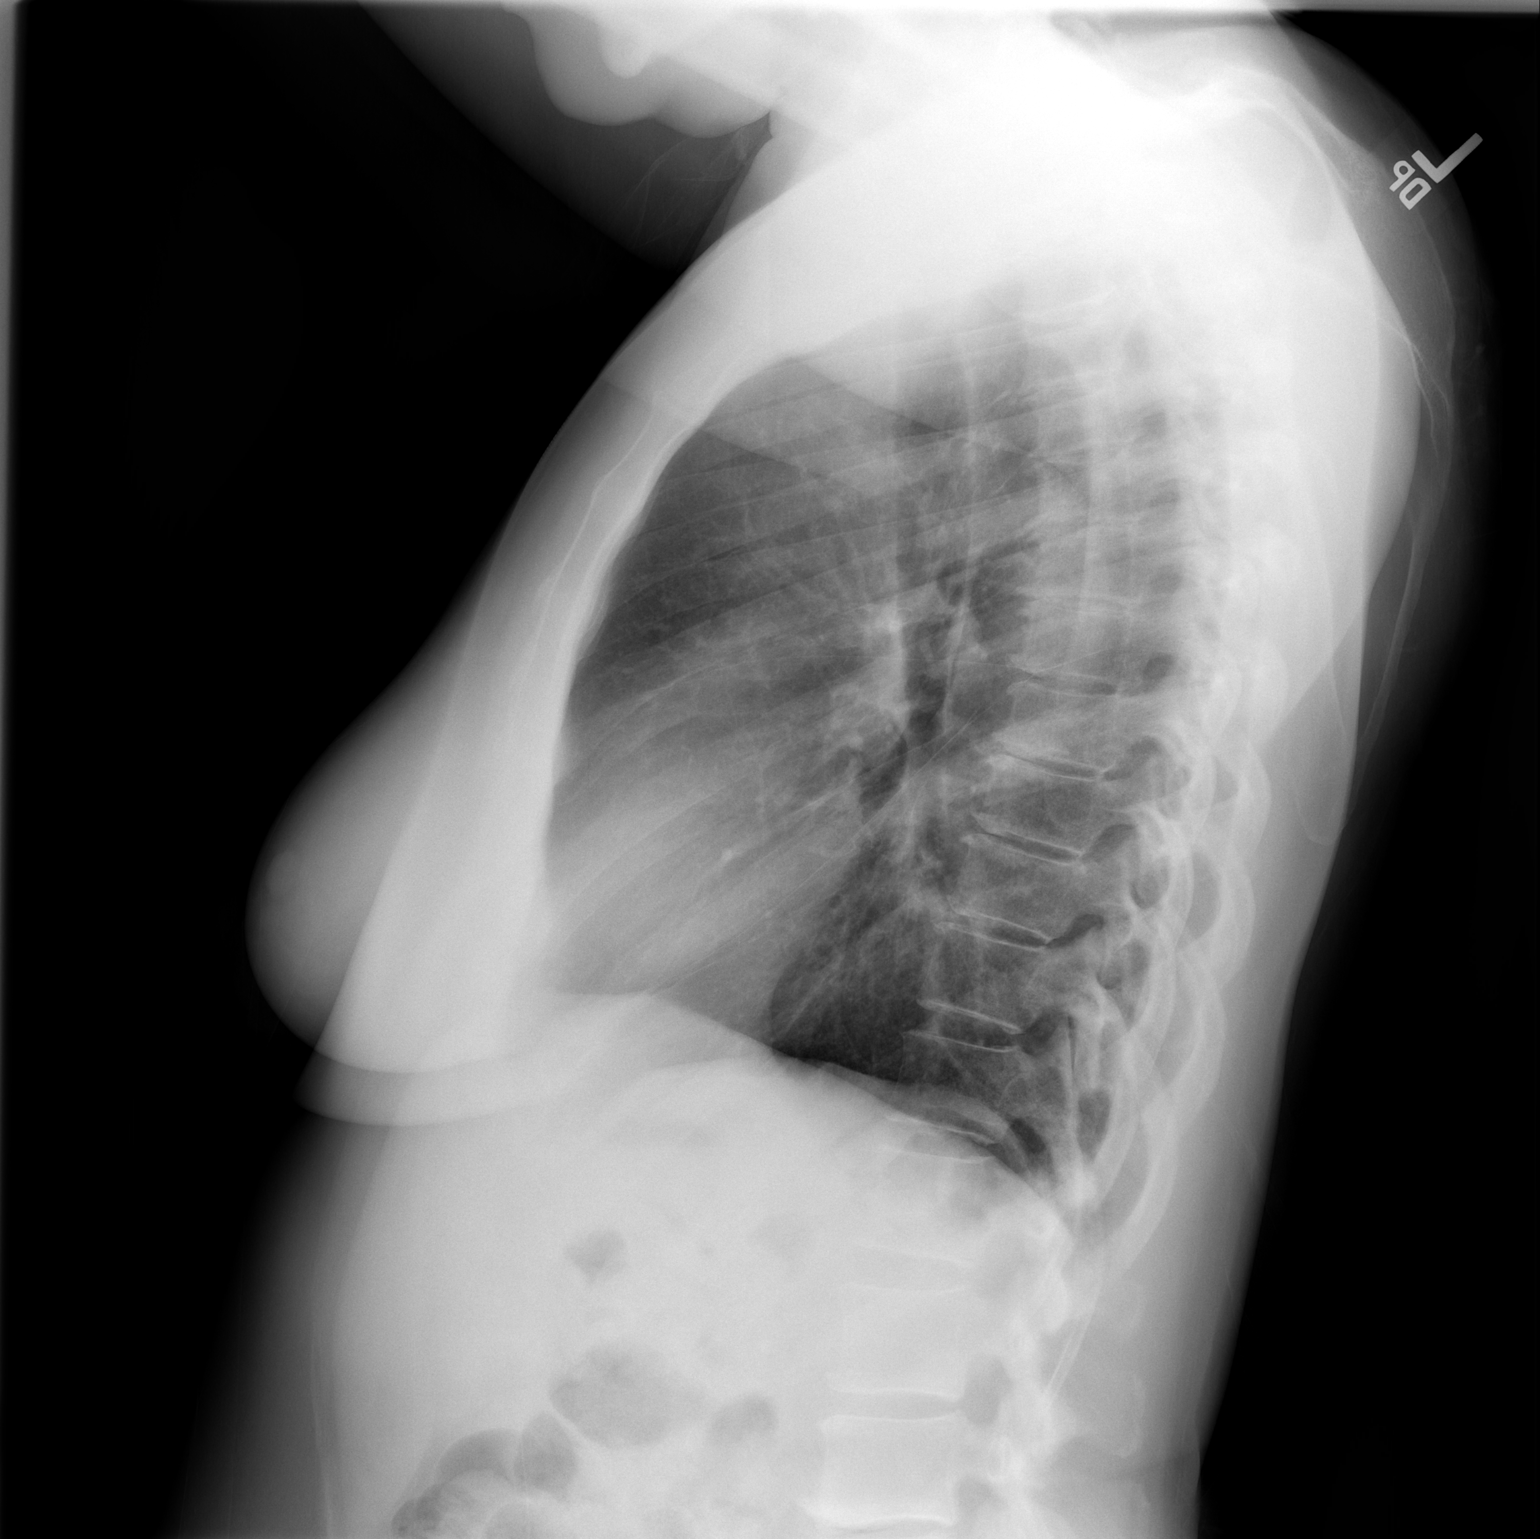

[2 of 2 positions shown; findings below may reference images not displayed]

FINDINGS: Cardiac silhouette and mediastinal contours are within normal
limits. Mild calcification within aortic arch. The lungs are clear.
No pleural effusion or pneumothorax. Mild-to-moderate multilevel
degenerative disc changes of the midthoracic spine. Old healed left
clavicular fracture.
IMPRESSION: No thoracic lymphadenopathy.  Clear lungs.

## 2023-10-22 ENCOUNTER — Ambulatory Visit: Attending: Rheumatology | Admitting: Pharmacist

## 2023-10-22 DIAGNOSIS — M0579 Rheumatoid arthritis with rheumatoid factor of multiple sites without organ or systems involvement: Secondary | ICD-10-CM | POA: Diagnosis not present

## 2023-10-22 DIAGNOSIS — Z7189 Other specified counseling: Secondary | ICD-10-CM | POA: Diagnosis not present

## 2023-10-22 DIAGNOSIS — Z79899 Other long term (current) drug therapy: Secondary | ICD-10-CM | POA: Diagnosis not present

## 2023-10-22 MED ORDER — ENBREL MINI 50 MG/ML ~~LOC~~ SOCT
50.0000 mg | SUBCUTANEOUS | 0 refills | Status: DC
Start: 2023-10-22 — End: 2023-12-24

## 2023-10-22 NOTE — Patient Instructions (Addendum)
 Your next ENBREL  dose is due on 10/29/2023, 11/05/23, and every 7 days thereafter  CONTINUE methotrexate  20mg  (0.45mL) subcut once weekly with folic acid  2mg  daily  HOLD ENBREL  and METHOTREXATE  if you have signs or symptoms of an infection. You can resume once you feel better or back to your baseline. HOLD ENBREL  and METHOTREXATE  if you start antibiotics to treat an infection. HOLD ENBREL  and METHOTREXATE  around the time of surgery/procedures. Your surgeon will be able to provide recommendations on when to hold BEFORE and when you are cleared to RESUME.  Pharmacy information: Your prescription will be shipped from [facility]. Their phone number is [phone] Please call to schedule shipment and confirm address. They will mail your medication to your home.  Cost information: Your copay should be affordable. If you call the pharmacy and it is not affordable, please double-check that they are billing through your copay card as secondary coverage. That copay card information is: BIN: [id number] PCN: [id number] Group: [id number] ID: [id number]  Labs are due in 1 month then every 3 months. Lab hours are from Monday to Thursday 8am-12:30pm and 1pm-4pm and Friday 8am-12pm. You do not need an appointment if you come for labs during these times. If you'd like to go to a [facility] or [facility] closer to home, please call our clinic 48 hours prior to lab date so we can release orders in a timely manner.  Stay up to date on all routine vaccines: influenza, pneumonia, COVID19, Shingles  How to manage an injection site reaction: Remember the 5 C's: COUNTER - leave on the counter at least 30 minutes but up to overnight to bring medication to room temperature. This may help prevent stinging COLD - place something cold (like an ice gel pack or cold water bottle) on the injection site just before cleansing with alcohol. This may help reduce pain CLARITIN - use Claritin (generic name is loratadine) for the first two  weeks of treatment or the day of, the day before, and the day after injecting. This will help to minimize injection site reactions CORTISONE CREAM - apply if injection site is irritated and itching CALL ME - if injection site reaction is bigger than the size of your fist, looks infected, blisters, or if you develop hives

## 2023-10-22 NOTE — Progress Notes (Signed)
 Pharmacy Note  Subjective:   Patient presents to clinic today to receive first dose of ENBREL  for rheumatoid arthritis. Patient currently takes methotrexate  20mg  (0.25mL) subcut once weekly with folic acid  2mg  daily.  Patient running a fever or have signs/symptoms of infection? No  Patient currently on antibiotics for the treatment of infection? No  Patient have any upcoming invasive procedures/surgeries? No  Objective: CMP     Component Value Date/Time   NA 141 08/22/2023 0833   K 4.3 08/22/2023 0833   CL 107 08/22/2023 0833   CO2 27 08/22/2023 0833   GLUCOSE 65 08/22/2023 0833   BUN 16 08/22/2023 0833   CREATININE 0.84 08/22/2023 0833   CALCIUM 9.3 08/22/2023 0833   PROT 6.6 08/22/2023 0833   ALBUMIN 4.2 10/07/2016 1316   AST 17 08/22/2023 0833   ALT 8 08/22/2023 0833   ALKPHOS 50 10/07/2016 1316   BILITOT 0.4 08/22/2023 0833   GFRNONAA 84 07/18/2020 0846   GFRAA 97 07/18/2020 0846    CBC    Component Value Date/Time   WBC 3.6 (L) 08/22/2023 0833   RBC 3.85 08/22/2023 0833   HGB 11.7 08/22/2023 0833   HCT 36.2 08/22/2023 0833   PLT 249 08/22/2023 0833   MCV 94.0 08/22/2023 0833   MCH 30.4 08/22/2023 0833   MCHC 32.3 08/22/2023 0833   RDW 12.9 08/22/2023 0833   LYMPHSABS 1,596 11/06/2022 1229   MONOABS 300 10/07/2016 1316   EOSABS 259 08/22/2023 0833   BASOSABS 50 08/22/2023 0833    Baseline Immunosuppressant Therapy Labs TB GOLD    Latest Ref Rng & Units 02/12/2018   12:12 PM  Quantiferon TB Gold  Quantiferon TB Gold Plus NEGATIVE POSITIVE    Hepatitis Panel    Latest Ref Rng & Units 02/12/2018   12:12 PM  Hepatitis  Hep B Surface Ag NON-REACTI NON-REACTIVE   Hep B IgM NON-REACTI NON-REACTIVE   Hep C Ab NON-REACTI NON-REACTIVE    HIV Lab Results  Component Value Date   HIV NON-REACTIVE 02/12/2018   Immunoglobulins    Latest Ref Rng & Units 02/12/2018   12:12 PM  Immunoglobulin Electrophoresis  IgA  47 - 310 mg/dL 750   IgG 399 - 8,359 mg/dL  8,809   IgM 50 - 699 mg/dL 888    SPEP    Latest Ref Rng & Units 08/22/2023    8:33 AM  Serum Protein Electrophoresis  Total Protein 6.1 - 8.1 g/dL 6.6    Chest x-ray: 1/88/7974 - No radiographic evidence of active tuberculosis.  Assessment/Plan:  Reviewed importance of holding ENBREL  with signs/symptoms of an infections, if antibiotics are prescribed to treat an active infection, and with invasive procedures  Demonstrated proper injection technique with Enbrel  Mini and Autotouch demo device  Patient able to demonstrate proper injection technique using the teach back method.  Patient self injected in the right lower abdomen with:  Sample Medication: Enbrel  Mini 50mg /mL cartridge NDC: [address]-03 Lot: [id number] Expiration: 11/08/2024  Sample Medication: Enbrel  Autotouch Injector Device (Patient to keep) NDC: [address]-98 Lot: [id number] Expiration: 12/08/2025  Patient tolerated well. Reports this was far more comfortable than administering in the thigh.  Observed for 30 mins in office for adverse reaction. Patient denies itchiness and irritation at injection., No swelling or redness noted., and Reviewed injection site reaction management with patient verbally and printed information for review in AVS  Patient is to return in 1 month for labs and 6-8 weeks for follow-up appointment.  Standing orders for CBC/CMP placed.  TB gold will be monitored yearly. Lipid panel will be monitored every 6-12 months.  ENBREL  approved through insurance .   Rx sent to: [facility]: [phone].  Patient provided with pharmacy phone number and advised to call later this week to schedule shipment to home.  Patient will continue ENBREL  50mg  subcut every 7 days in combination. She will continue methotrexate  20mg  (0.16mL) subcut once weekly with folic acid  2mg  daily.  All questions encouraged and answered.  Instructed patient to call with any further questions or concerns.  Sherry Pennant, PharmD, MPH, BCPS, CPP Clinical Pharmacist (Rheumatology and Pulmonology)  10/22/2023 8:16 AM

## 2023-10-28 ENCOUNTER — Other Ambulatory Visit: Payer: Self-pay | Admitting: Rheumatology

## 2023-10-28 DIAGNOSIS — M0579 Rheumatoid arthritis with rheumatoid factor of multiple sites without organ or systems involvement: Secondary | ICD-10-CM

## 2023-11-25 ENCOUNTER — Other Ambulatory Visit: Payer: Self-pay

## 2023-11-25 DIAGNOSIS — Z79899 Other long term (current) drug therapy: Secondary | ICD-10-CM

## 2023-11-26 ENCOUNTER — Ambulatory Visit: Payer: Self-pay | Admitting: Rheumatology

## 2023-11-26 LAB — COMPREHENSIVE METABOLIC PANEL WITH GFR
AG Ratio: 1.9 (calc) (ref 1.0–2.5)
ALT: 8 U/L (ref 6–29)
AST: 16 U/L (ref 10–35)
Albumin: 4.1 g/dL (ref 3.6–5.1)
Alkaline phosphatase (APISO): 40 U/L (ref 37–153)
BUN: 17 mg/dL (ref 7–25)
CO2: 27 mmol/L (ref 20–32)
Calcium: 9.4 mg/dL (ref 8.6–10.4)
Chloride: 106 mmol/L (ref 98–110)
Creat: 0.95 mg/dL (ref 0.50–1.05)
Globulin: 2.2 g/dL (ref 1.9–3.7)
Glucose, Bld: 112 mg/dL — ABNORMAL HIGH (ref 65–99)
Potassium: 4.3 mmol/L (ref 3.5–5.3)
Sodium: 141 mmol/L (ref 135–146)
Total Bilirubin: 0.5 mg/dL (ref 0.2–1.2)
Total Protein: 6.3 g/dL (ref 6.1–8.1)
eGFR: 68 mL/min/1.73m2 (ref >=60)

## 2023-11-26 LAB — CBC WITH DIFFERENTIAL/PLATELET
Absolute Lymphocytes: 2134 {cells}/uL (ref 850–3900)
Absolute Monocytes: 343 {cells}/uL (ref 200–950)
Basophils Absolute: 48 {cells}/uL (ref 0–200)
Basophils Relative: 1.1 %
Eosinophils Absolute: 422 {cells}/uL (ref 15–500)
Eosinophils Relative: 9.6 %
HCT: 38.7 % (ref 35.0–45.0)
Hemoglobin: 12.5 g/dL (ref 11.7–15.5)
MCH: 30.9 pg (ref 27.0–33.0)
MCHC: 32.3 g/dL (ref 32.0–36.0)
MCV: 95.6 fL (ref 80.0–100.0)
MPV: 10.8 fL (ref 7.5–12.5)
Monocytes Relative: 7.8 %
Neutro Abs: 1452 {cells}/uL — ABNORMAL LOW (ref 1500–7800)
Neutrophils Relative %: 33 %
Platelets: 228 Thousand/uL (ref 140–400)
RBC: 4.05 Million/uL (ref 3.80–5.10)
RDW: 13.1 % (ref 11.0–15.0)
Total Lymphocyte: 48.5 %
WBC: 4.4 Thousand/uL (ref 3.8–10.8)

## 2023-11-26 NOTE — Progress Notes (Unsigned)
 Office Visit Note  Patient: Samantha Hughes             Date of Birth: [birth year]           MRN: [id number]             PCP: Signa Dire, MD (Inactive) Referring: No ref. provider found Visit Date: 12/10/2023 Occupation: Data Unavailable  Subjective:  Left knee pain and swelling  History of Present Illness: Samantha Hughes is a 63 y.o. female with history of rheumatoid arthritis.  Patient remains on methotrexate  0.8 ml sq injections once weekly and folic acid  2 mg daily.  Patient restarted Enbrel  on 10/22/2023.  She has not noticed any improvement in her left knee joint pain and swelling since reinitiating Enbrel .  Patient had a left knee joint aspiration and lidocaine  injection performed on 10/03/2023 which provided temporary relief but her symptoms have recurred.  Patient's been experiencing increased pain in the back of the knee and at times as a burning sensation.  Patient states that at times she notices some instability and weakness of the left knee. She denies any other joint pain or joint swelling at this time.  She has not had any morning stiffness in any other joints.  She has not had any difficulty performing ADLs.  She is not experiencing any neck pain or stiffness at this time.   Activities of Daily Living:  Patient reports morning stiffness for 0 minutes   Patient Reports nocturnal pain.  Difficulty dressing/grooming: Denies Difficulty climbing stairs: Reports Difficulty getting out of chair: Reports Difficulty using hands for taps, buttons, cutlery, and/or writing: Denies  Review of Systems  Constitutional:  Positive for fatigue.  HENT:  Negative for mouth sores, mouth dryness and nose dryness.   Eyes:  Negative for pain, visual disturbance and dryness.  Respiratory:  Negative for cough, hemoptysis, shortness of breath and difficulty breathing.   Cardiovascular:  Negative for chest pain, palpitations, hypertension and swelling in legs/feet.  Gastrointestinal:   Positive for constipation. Negative for blood in stool and diarrhea.  Endocrine: Negative for increased urination.  Genitourinary:  Negative for painful urination.  Musculoskeletal:  Positive for joint pain, joint pain and joint swelling. Negative for myalgias, muscle weakness, morning stiffness, muscle tenderness and myalgias.  Skin:  Negative for color change, pallor, rash, hair loss, nodules/bumps, skin tightness, ulcers and sensitivity to sunlight.  Allergic/Immunologic: Negative for susceptible to infections.  Neurological:  Negative for dizziness, numbness, headaches and weakness.  Hematological:  Negative for swollen glands.  Psychiatric/Behavioral:  Negative for depressed mood and sleep disturbance. The patient is not nervous/anxious.     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   High risk medication use 02/06/2016   Positive PPD, treated 02/06/2016    Past Medical History:  Diagnosis Date   Depression    Osteoarthritis    Rheumatoid arthritis (HCC)    Thyroid  nodule     Family History  Problem Relation Age of Onset   Diabetes Mother    Hypertension Mother    Diabetes Sister    Diabetes Maternal Grandmother    Cancer  Maternal Grandfather    Past Surgical History:  Procedure Laterality Date   eye lift     ingrown toenail Bilateral    bilateral great toes   TUBAL LIGATION     Social History   Tobacco Use   Smoking status: Former    Current packs/day: 0.50    Average packs/day: 0.5 packs/day for 30.0 years (15.0 ttl pk-yrs)    Types: Cigarettes    Passive exposure:  Never   Smokeless tobacco: Never  Vaping Use   Vaping status: Every Day  Substance Use Topics   Alcohol use: Yes    Comment: Socially    Drug use: No   Social History   Social History Narrative   Not on file     Immunization History  Administered Date(s) Administered   Moderna Sars-Covid-2 Vaccination 05/17/2019, 06/15/2019, 03/02/2020, 12/08/2020     Objective: Vital Signs: BP 104/78   Pulse 84   Temp 97.7 F (36.5 C)   Resp 16   Ht 5' 6 (1.676 m)   Wt 190 lb (86.2 kg)   BMI 30.67 kg/m    Physical Exam Vitals and nursing note reviewed.  Constitutional:      Appearance: She is well-developed.  HENT:     Head: Normocephalic and atraumatic.  Eyes:     Conjunctiva/sclera: Conjunctivae normal.  Cardiovascular:     Rate and Rhythm: Normal rate and regular rhythm.     Heart sounds: Normal heart sounds.  Pulmonary:     Effort: Pulmonary effort is normal.     Breath sounds: Normal breath sounds.  Abdominal:     General: Bowel sounds are normal.     Palpations: Abdomen is soft.  Musculoskeletal:     Cervical back: Normal range of motion.  Lymphadenopathy:     Cervical: No cervical adenopathy.  Skin:    General: Skin is warm and dry.     Capillary Refill: Capillary refill takes less than 2 seconds.  Neurological:     Mental Status: She is alert and oriented to person, place, and time.  Psychiatric:        Behavior: Behavior normal.      Musculoskeletal Exam: C-spine, thoracic spine, lumbar spine have good range of motion.  No midline spinal tenderness.  No SI joint tenderness.  Shoulder joints, elbow joints, wrist joints, MCPs, PIPs, DIPs have good range of motion with no synovitis.  Synovial thickening of bilateral second MCP joints.  Complete fist formation bilaterally.  Hip joints have good range of motion with no groin pain.  Right knee has good range of motion with no warmth or effusion.  Left knee has a moderate effusion and possible Baker's cyst palpable  in the posterior aspect of the left knee.  Ankle joints have good range of motion no tenderness or joint swelling.  No evidence of Achilles tendinitis or plantar fasciitis.   CDAI Exam: CDAI Score: -- Patient Global: --; Provider Global: -- Swollen: --; Tender: -- Joint Exam 12/10/2023   No joint exam has been documented for this visit   There is currently no information documented on the homunculus. Go to the Rheumatology activity and complete the homunculus joint exam.  Investigation: No additional findings.  Imaging: No results found.  Recent Labs: Lab Results  Component Value Date   WBC 4.4 11/25/2023   HGB 12.5 11/25/2023   PLT 228 11/25/2023   NA 141 11/25/2023   K 4.3 11/25/2023   CL 106 11/25/2023   CO2 27 11/25/2023  GLUCOSE 112 (H) 11/25/2023   BUN 17 11/25/2023   CREATININE 0.95 11/25/2023   BILITOT 0.5 11/25/2023   ALKPHOS 50 10/07/2016   AST 16 11/25/2023   ALT 8 11/25/2023   PROT 6.3 11/25/2023   ALBUMIN 4.2 10/07/2016   CALCIUM 9.4 11/25/2023   GFRAA 97 07/18/2020   QFTBGOLDPLUS POSITIVE (A) 02/12/2018    Speciality Comments: Humira  (March 2022 to 10/09/20 - stopped d/t injection site reaction), changed to Enbrel  on 10/25/20  Procedures:  No procedures performed Allergies: Arava [leflunomide], Plaquenil [hydroxychloroquine sulfate], and Humira  [adalimumab ]   Assessment / Plan:     Visit Diagnoses: Rheumatoid arthritis with rheumatoid factor of multiple sites without organ or systems involvement [facility]): Patient presents today with ongoing pain and inflammation involving the left knee.  Her symptoms initially started in early July prompting evaluation on 09/16/2023.  No injury or fall prior to the onset of symptoms.  X-rays were unremarkable at that visit and a left knee joint cortisone injection was performed.  Her symptoms persisted to the point that she returned on 10/03/2023 for further evaluation--moderate effusion noted required aspiration and lidocaine   injection.  Synovial analysis revealed 228 WBCs, no crystals.  She had temporary relief after the aspiration but her symptoms have recurred.  On examination she has a recurrence of a moderate effusion as well as a Baker's cyst in the posterior aspect of the left knee.  She has been experiencing instability and weakness in the left knee at times. She is not experiencing other joint pain or inflammation at this time.  She has no synovitis involving any other joints on examination. Patient remains on methotrexate  0.8 mL sq injections once weekly and folic acid  2 mg daily.  Enbrel  was restarted on 10/22/2023.  She is tolerating Enbrel  without any side effects or injection site reactions.  She has not noticed any improvement in her left knee joint pain since reinitiating Enbrel .  She is not experiencing signs of a flare involving any other joints.  Plan to proceed with an MRI of the left knee for further evaluation--plan to try to rule out an internal derangement given the chronicity, inadequate response to cortisone injection, and instability in the left knee. She remain on Enbrel  and methotrexate  as prescribed. She will follow up in 3 months or sooner if needed.   High risk medication use - Methotrexate  0.8 ml sq injections once weekly and folic acid  2 mg by mouth daily.  Enbrel  restarted on 10/22/2023.  She will remain on Enbrel  50 mg subcu injections once weekly. CBC and CMP updated on 11/25/23.  Her next lab work will be due in December and every 3 months.  No recent or recurrent infections.  Discussed the importance of holding methotrexate  if she develops signs or symptoms of an infection and to resume once the infection has completely cleared.  CXR no radiographic evidence of active TB on 10/08/23.   Positive PPD, treated: Chest x-ray did not reveal any radiographic evidence of active TB on 10/08/2023.  Rheumatoid nodulosis (HCC): Resolved.  Bilateral scleritis: She has not had any signs or symptoms of a  scleritis flare.  She is not experiencing eye pain, photophobia, or conjunctival injection.  Discoid lupus: No recurrence.  Trapezius muscle spasm: Not currently symptomatic.  Not experiencing any muscle spasms.  She has good range of motion of the cervical spine on examination today.  Primary osteoarthritis of both hands: Prominence of the right CMC joint.  PIP and DIP thickening consistent with osteoarthritis of  both hands.  No synovitis noted.  Complete fist formation bilaterally.  Effusion, left knee:Recurrent.  Moderate.  X-rays unremarkable on 09/16/2023.  Left knee joint cortisone injection performed on 09/16/2023.  Her symptoms persisted to the point that she had an effusion which was aspirated on 10/03/2023.  Synovial analysis was unremarkable.  Her symptoms have persisted.  She is been experiencing increased instability and weakness in the left knee at times.  Plan to proceed with MRI for further evaluation--plan to rule out internal derangement due to chronicity of pain, and attic response to cortisone injection, instability, and recurrent effusion.  Chronic pain of left knee: X-rays of the left knee were unremarkable on 09/16/2023.  She had a left knee joint cortisone injection performed on 09/16/2023 and returned with a moderate effusion on 10/03/2023.  She had an aspiration and lidocaine  injection performed at that time which bided temporary relief but her symptoms have recurred.  Primary osteoarthritis of both knees: Right knee has good ROM with no warmth or effusion.  Left knee continues to have pain and a moderate effusion.  X-rays of the left knee were unremarkable on 09/16/23. Plan to proceed with MRI to rule out internal derangement.   Primary osteoarthritis of both feet: Good ROM with no tenderness or joint swelling.  She is wearing proper fitting shoes.   Other medical conditions are listed as follows:  History of anemia  Neck pain: Her neck pain has improved.  She has good range of  motion on examination today with no discomfort.  No symptoms of radiculopathy.  Thyroid  nodule  Orders: No orders of the defined types were placed in this encounter.  No orders of the defined types were placed in this encounter.    Follow-Up Instructions: Return in about 3 months (around 03/11/2024) for Rheumatoid arthritis.   Waddell CHRISTELLA Craze, PA-C  Note - This record has been created using Dragon software.  Chart creation errors have been sought, but may not always  have been located. Such creation errors do not reflect on  the standard of medical care.

## 2023-11-26 NOTE — Progress Notes (Signed)
CBC and CMP are stable.  Glucose is mildly elevated, probably not a fasting sample.

## 2023-12-10 ENCOUNTER — Ambulatory Visit: Attending: Physician Assistant | Admitting: Physician Assistant

## 2023-12-10 ENCOUNTER — Encounter: Payer: Self-pay | Admitting: Physician Assistant

## 2023-12-10 VITALS — BP 104/78 | HR 84 | Temp 97.7°F | Resp 16 | Ht 66.0 in | Wt 190.0 lb

## 2023-12-10 DIAGNOSIS — M19041 Primary osteoarthritis, right hand: Secondary | ICD-10-CM

## 2023-12-10 DIAGNOSIS — M0630 Rheumatoid nodule, unspecified site: Secondary | ICD-10-CM | POA: Diagnosis not present

## 2023-12-10 DIAGNOSIS — G8929 Other chronic pain: Secondary | ICD-10-CM

## 2023-12-10 DIAGNOSIS — Z79899 Other long term (current) drug therapy: Secondary | ICD-10-CM

## 2023-12-10 DIAGNOSIS — E041 Nontoxic single thyroid nodule: Secondary | ICD-10-CM

## 2023-12-10 DIAGNOSIS — Z862 Personal history of diseases of the blood and blood-forming organs and certain disorders involving the immune mechanism: Secondary | ICD-10-CM

## 2023-12-10 DIAGNOSIS — L930 Discoid lupus erythematosus: Secondary | ICD-10-CM

## 2023-12-10 DIAGNOSIS — M19071 Primary osteoarthritis, right ankle and foot: Secondary | ICD-10-CM

## 2023-12-10 DIAGNOSIS — M25462 Effusion, left knee: Secondary | ICD-10-CM

## 2023-12-10 DIAGNOSIS — H15003 Unspecified scleritis, bilateral: Secondary | ICD-10-CM

## 2023-12-10 DIAGNOSIS — M19072 Primary osteoarthritis, left ankle and foot: Secondary | ICD-10-CM

## 2023-12-10 DIAGNOSIS — R7611 Nonspecific reaction to tuberculin skin test without active tuberculosis: Secondary | ICD-10-CM | POA: Diagnosis not present

## 2023-12-10 DIAGNOSIS — M0579 Rheumatoid arthritis with rheumatoid factor of multiple sites without organ or systems involvement: Secondary | ICD-10-CM

## 2023-12-10 DIAGNOSIS — M25562 Pain in left knee: Secondary | ICD-10-CM

## 2023-12-10 DIAGNOSIS — M62838 Other muscle spasm: Secondary | ICD-10-CM

## 2023-12-10 DIAGNOSIS — M542 Cervicalgia: Secondary | ICD-10-CM

## 2023-12-10 DIAGNOSIS — M170 Bilateral primary osteoarthritis of knee: Secondary | ICD-10-CM

## 2023-12-10 DIAGNOSIS — M19042 Primary osteoarthritis, left hand: Secondary | ICD-10-CM

## 2023-12-10 NOTE — Patient Instructions (Signed)
 Standing Labs We placed an order today for your standing lab work.   Please have your standing labs drawn in Mid-December and every 3 months   Please have your labs drawn 2 weeks prior to your appointment so that the provider can discuss your lab results at your appointment, if possible.  Please note that you may see your imaging and lab results in MyChart before we have reviewed them. We will contact you once all results are reviewed. Please allow our office up to 72 hours to thoroughly review all of the results before contacting the office for clarification of your results.  WALK-IN LAB HOURS  Monday through Thursday from 8:00 am -12:30 pm and 1:00 pm-4:30 pm and Friday from 8:00 am-12:00 pm.  Patients with office visits requiring labs will be seen before walk-in labs.  You may encounter longer than normal wait times. Please allow additional time. Wait times may be shorter on  Monday and Thursday afternoons.  We do not book appointments for walk-in labs. We appreciate your patience and understanding with our staff.   Labs are drawn by [facility]. Please bring your co-pay at the time of your lab draw.  You may receive a bill from [facility] for your lab work.  Please note if you are on Hydroxychloroquine and and an order has been placed for a Hydroxychloroquine level,  you will need to have it drawn 4 hours or more after your last dose.  If you wish to have your labs drawn at another location, please call the office 24 hours in advance so we can fax the orders.  The office is located at [address], [place], [place] [address]   If you have any questions regarding directions or hours of operation,  please call [phone].   As a reminder, please drink plenty of water prior to coming for your lab work. Thanks!

## 2023-12-10 NOTE — Addendum Note (Signed)
 Addended by: KNUTE REENA DEL on: 12/10/2023 10:22 AM   Modules accepted: Orders

## 2023-12-24 ENCOUNTER — Other Ambulatory Visit: Payer: Self-pay

## 2023-12-24 DIAGNOSIS — M0579 Rheumatoid arthritis with rheumatoid factor of multiple sites without organ or systems involvement: Secondary | ICD-10-CM

## 2023-12-24 DIAGNOSIS — Z79899 Other long term (current) drug therapy: Secondary | ICD-10-CM

## 2023-12-24 MED ORDER — ENBREL MINI 50 MG/ML ~~LOC~~ SOCT: 50.0000 mg | SUBCUTANEOUS | 0 refills | Status: DC

## 2023-12-24 NOTE — Telephone Encounter (Signed)
 Last Fill: 10/22/2023  Labs: 11/25/2023 CBC and CMP are stable. Glucose is mildly elevated, probably not a fasting sample.   Chest x-ray: 10/08/2023  No radiographic evidence of active tuberculosis.   Next Visit: 05/10/2024  Last Visit: 12/10/2023  DX: Rheumatoid arthritis with rheumatoid factor of multiple sites without organ or systems involvement   Current Dose per office note on 12/10/2023: Enbrel  50 mg subcu injections once weekly.   Okay to refill Enbrel ?

## 2023-12-27 ENCOUNTER — Ambulatory Visit
Admission: RE | Admit: 2023-12-27 | Discharge: 2023-12-27 | Disposition: A | Discharge status: Home / Self Care | Source: Ambulatory Visit | Attending: Physician Assistant | Admitting: Physician Assistant

## 2023-12-27 DIAGNOSIS — M170 Bilateral primary osteoarthritis of knee: Secondary | ICD-10-CM

## 2023-12-27 DIAGNOSIS — M25462 Effusion, left knee: Secondary | ICD-10-CM

## 2023-12-27 DIAGNOSIS — G8929 Other chronic pain: Secondary | ICD-10-CM

## 2023-12-29 ENCOUNTER — Ambulatory Visit: Payer: Self-pay | Admitting: Physician Assistant

## 2023-12-29 DIAGNOSIS — M25462 Effusion, left knee: Secondary | ICD-10-CM

## 2023-12-29 DIAGNOSIS — M170 Bilateral primary osteoarthritis of knee: Secondary | ICD-10-CM

## 2023-12-29 DIAGNOSIS — G8929 Other chronic pain: Secondary | ICD-10-CM

## 2023-12-29 NOTE — Progress Notes (Signed)
 I called the patient to discuss the MRI results.  Plan to refer the patient to Dr. Addie for further evaluation and management.  Please pend referral.  All questions were addressed.

## 2024-01-12 ENCOUNTER — Encounter: Payer: Self-pay | Admitting: Radiology

## 2024-01-21 ENCOUNTER — Ambulatory Visit: Admitting: Orthopedic Surgery

## 2024-01-21 DIAGNOSIS — S83242D Other tear of medial meniscus, current injury, left knee, subsequent encounter: Secondary | ICD-10-CM

## 2024-01-22 ENCOUNTER — Encounter: Payer: Self-pay | Admitting: Orthopedic Surgery

## 2024-01-22 NOTE — Progress Notes (Signed)
 Office Visit Note   Patient: Samantha Hughes           Date of Birth: [birth year]           MRN: [id number] Visit Date: 01/21/2024 Requested by: Cheryl Waddell HERO, PA-C [address] [place],  [place] [address] PCP: Signa Dire, MD (Inactive)  Subjective: Chief Complaint  Patient presents with   Other    Bilateral knee pain    HPI: Samantha Hughes is a 63 y.o. female who presents to the office reporting left knee pain.  Radiographs done on 09/16/2023 demonstrate minimal arthritis and maintenance of the joint spaces in all 3 compartments.  Subsequent MRI scan demonstrates medial meniscal tear with small displaced meniscal fragment into the inferior meniscal recess.  Patient describes pain since late June.  Relatively constant pain and swelling.  Wakes her from sleep at night.  She sleeps with the knee bent.  She states that her knee locks from time to time.  She also reports popping and giving way.  She has to walk with the leg stiff at times.  She has had cortisone shot and aspiration which only gives minimal relief.  She feels like she may have stepped off the curb in awkward manner over 3 months ago.  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  She wants to get back to the gym.  She does have a history of rheumatoid arthritis and takes Enbrel  and methotrexate ..                ROS: All systems reviewed are negative as they relate to the chief complaint within the history of present illness.  Patient denies fevers or chills.  Assessment & Plan: Visit Diagnoses: No diagnosis found.  Plan: Impression is likely symptomatic left knee medial meniscal tear.  Really not much arthritis on plain radiographs or MRI scan.  The medial meniscal tear may or may not be his unstable.  Does look to have a small flap on coronal view in the inferior recess.  In general and the bulk of the meniscus appears intact.  Talked a lot today about operative and nonoperative treatment options  for this problem.  She wants to try rehab a little bit longer in terms of activity modification.  19-month return with clinical recheck on the need to decide for or against operative intervention.  Follow-Up Instructions: No follow-ups on file.   Orders:  No orders of the defined types were placed in this encounter.  No orders of the defined types were placed in this encounter.     Procedures: No procedures performed   Clinical Data: No additional findings.  Objective: Vital Signs: There were no vitals taken for this visit.  Physical Exam:  Constitutional: Patient appears well-developed HEENT:  Head: Normocephalic Eyes:EOM are normal Neck: Normal range of motion Cardiovascular: Normal rate Pulmonary/chest: Effort normal Neurologic: Patient is alert Skin: Skin is warm Psychiatric: Patient has normal mood and affect  Ortho Exam: Ortho exam demonstrates full range of motion with trace effusion in that left knee no effusion of the right knee.  Collateral crucial ligaments are stable.  No warmth to the left knee compared to the right knee.  No groin pain with internal or external rotation of either leg and no nerve root tension signs.  Pedal pulses palpable.  Range of motion without crepitus in the patellofemoral joint.  Patient does have a little bit more medial joint line tenderness on  the left compared to the right.  No other masses lymphadenopathy or skin changes noted in that left knee region.  Specialty Comments:  CLINICAL DATA:  Cervical radiculopathy, no red flags cervical radiculopathy   EXAM: MRI CERVICAL SPINE WITHOUT CONTRAST   TECHNIQUE: Multiplanar, multisequence MR imaging of the cervical spine was performed. No intravenous contrast was administered.   COMPARISON:  X-ray 04/16/2023   FINDINGS: Alignment: Physiologic.   Vertebrae: No acute fracture. No evidence of discitis. Reactive subchondral bone marrow edema associated with the left C2-C3 facet joint.  Patchy increased T2 marrow signal spanning from C6 through T3, likely a combination of discogenic endplate marrow changes and generalized marrow heterogeneity. No well-defined marrow replacing bone lesion.   Cord: Normal signal and morphology.   Posterior Fossa, vertebral arteries, paraspinal tissues: Left thyroid  lobe is enlarged and heterogeneous with multiple nodules, previously characterized by ultrasound in June of 2023 with subsequent FNA.   Disc levels:   C2-C3: Minimal disc bulge with left greater than right facet arthropathy. No significant foraminal or canal stenosis.   C3-C4: Disc osteophyte complex with bilateral facet arthropathy. Mild canal stenosis with mild bilateral foraminal stenosis.   C4-C5: Disc osteophyte complex with bilateral facet arthropathy and uncovertebral spurring. Mild canal stenosis with moderate bilateral foraminal stenosis.   C5-C6: Disc osteophyte complex with facet and uncovertebral spurring. No significant canal stenosis. Mild left foraminal stenosis.   C6-C7: Disc osteophyte complex with facet and uncovertebral spurring. No significant canal stenosis. Mild-to-moderate right and mild left foraminal stenosis.   C7-T1: Bilateral facet arthropathy without foraminal or canal stenosis.   IMPRESSION: 1. Multilevel cervical spondylosis, most pronounced at the C4-5 level where there is mild canal stenosis and moderate bilateral foraminal stenosis. 2. Mild canal stenosis and mild bilateral foraminal stenosis at C3-4. 3. Reactive subchondral bone marrow edema associated with the left C2-C3 facet joint, which may be a focal source of pain.     Electronically Signed   By: Mabel Converse D.O.   On: 05/02/2023 16:18  Imaging: No results found.   PMFS History: Patient Active Problem List   Diagnosis Date Noted   Smoker 07/08/2016   Female pattern hair loss 06/27/2016   Traction alopecia 06/27/2016   Rheumatoid arthritis with  rheumatoid factor of multiple sites without organ or systems involvement (HCC) 02/06/2016   Discoid lupus 02/06/2016   Primary osteoarthritis of both hands 02/06/2016   Primary osteoarthritis of both knees 02/06/2016   Rheumatoid nodulosis (HCC) 02/06/2016   High risk medication use 02/06/2016   Positive PPD, treated 02/06/2016   Past Medical History:  Diagnosis Date   Depression    Osteoarthritis    Rheumatoid arthritis (HCC)    Thyroid  nodule     Family History  Problem Relation Age of Onset   Diabetes Mother    Hypertension Mother    Diabetes Sister    Diabetes Maternal Grandmother    Cancer Maternal Grandfather     Past Surgical History:  Procedure Laterality Date   eye lift     ingrown toenail Bilateral    bilateral great toes   TUBAL LIGATION     Social History   Occupational History   Not on file  Tobacco Use   Smoking status: Former    Current packs/day: 0.50    Average packs/day: 0.5 packs/day for 30.0 years (15.0 ttl pk-yrs)    Types: Cigarettes    Passive exposure: Never   Smokeless tobacco: Never  Vaping Use   Vaping status: Every  Day  Substance and Sexual Activity   Alcohol use: Yes    Comment: Socially    Drug use: No   Sexual activity: Not on file

## 2024-02-03 ENCOUNTER — Ambulatory Visit: Admitting: Physician Assistant

## 2024-02-25 ENCOUNTER — Other Ambulatory Visit: Payer: Self-pay

## 2024-02-25 DIAGNOSIS — Z79899 Other long term (current) drug therapy: Secondary | ICD-10-CM

## 2024-02-25 LAB — CBC WITH DIFFERENTIAL/PLATELET
Absolute Lymphocytes: 1905 {cells}/uL (ref 850–3900)
Absolute Monocytes: 409 {cells}/uL (ref 200–950)
Basophils Absolute: 40 {cells}/uL (ref 0–200)
Basophils Relative: 0.9 %
Eosinophils Absolute: 290 {cells}/uL (ref 15–500)
Eosinophils Relative: 6.6 %
HCT: 37.7 % (ref 35.9–46.0)
Hemoglobin: 12.1 g/dL (ref 11.7–15.5)
MCH: 29.5 pg (ref 27.0–33.0)
MCHC: 32.1 g/dL (ref 31.6–35.4)
MCV: 92 fL (ref 81.4–101.7)
MPV: 11 fL (ref 7.5–12.5)
Monocytes Relative: 9.3 %
Neutro Abs: 1756 {cells}/uL (ref 1500–7800)
Neutrophils Relative %: 39.9 %
Platelets: 266 Thousand/uL (ref 140–400)
RBC: 4.1 Million/uL (ref 3.80–5.10)
RDW: 12.4 % (ref 11.0–15.0)
Total Lymphocyte: 43.3 %
WBC: 4.4 Thousand/uL (ref 3.8–10.8)

## 2024-02-25 LAB — COMPREHENSIVE METABOLIC PANEL WITH GFR
AG Ratio: 1.5 (calc) (ref 1.0–2.5)
ALT: 11 U/L (ref 6–29)
AST: 17 U/L (ref 10–35)
Albumin: 4 g/dL (ref 3.6–5.1)
Alkaline phosphatase (APISO): 53 U/L (ref 37–153)
BUN: 17 mg/dL (ref 7–25)
CO2: 26 mmol/L (ref 20–32)
Calcium: 9.7 mg/dL (ref 8.6–10.4)
Chloride: 106 mmol/L (ref 98–110)
Creat: 0.9 mg/dL (ref 0.50–1.05)
Globulin: 2.6 g/dL (ref 1.9–3.7)
Glucose, Bld: 110 mg/dL — ABNORMAL HIGH (ref 65–99)
Potassium: 4.4 mmol/L (ref 3.5–5.3)
Sodium: 140 mmol/L (ref 135–146)
Total Bilirubin: 0.3 mg/dL (ref 0.2–1.2)
Total Protein: 6.6 g/dL (ref 6.1–8.1)
eGFR: 72 mL/min/1.73m2 (ref >=60)

## 2024-02-26 ENCOUNTER — Ambulatory Visit: Payer: Self-pay | Admitting: Rheumatology

## 2024-02-26 NOTE — Progress Notes (Signed)
 CBC and CMP were normal.  Glucose was elevated at 110 probably not a fasting sample.

## 2024-03-17 ENCOUNTER — Other Ambulatory Visit: Payer: Self-pay

## 2024-03-17 DIAGNOSIS — Z79899 Other long term (current) drug therapy: Secondary | ICD-10-CM

## 2024-03-17 DIAGNOSIS — M0579 Rheumatoid arthritis with rheumatoid factor of multiple sites without organ or systems involvement: Secondary | ICD-10-CM

## 2024-03-17 MED ORDER — ENBREL MINI 50 MG/ML ~~LOC~~ SOCT: 50.0000 mg | SUBCUTANEOUS | 0 refills | Status: AC

## 2024-03-17 NOTE — Telephone Encounter (Signed)
 Last Fill: 12/24/2023  Labs: 02/25/2024 CBC and CMP were normal. Glucose was elevated at 110 probably not a fasting sample.   Chest X-Ray: 10/08/2023 Chest x-ray is unremarkable per radiology report.   Next Visit: 05/10/2024  Last Visit: 12/10/2023  IK:Myzlfjunpi arthritis with rheumatoid factor of multiple sites without organ or systems involvement (HCC)   Current Dose per office note 12/10/2023: Enbrel  restarted on 10/22/2023. She will remain on Enbrel  50 mg subcu injections once weekly.   Okay to refill Enbrel ?

## 2024-03-26 ENCOUNTER — Other Ambulatory Visit: Payer: Self-pay | Admitting: Internal Medicine

## 2024-03-26 DIAGNOSIS — Z122 Encounter for screening for malignant neoplasm of respiratory organs: Secondary | ICD-10-CM

## 2024-03-26 DIAGNOSIS — F17200 Nicotine dependence, unspecified, uncomplicated: Secondary | ICD-10-CM

## 2024-04-15 ENCOUNTER — Inpatient Hospital Stay: Admission: RE | Admit: 2024-04-15 | Discharge: 2024-04-15 | Attending: Internal Medicine

## 2024-04-15 DIAGNOSIS — F17200 Nicotine dependence, unspecified, uncomplicated: Secondary | ICD-10-CM

## 2024-04-15 DIAGNOSIS — Z122 Encounter for screening for malignant neoplasm of respiratory organs: Secondary | ICD-10-CM

## 2024-04-26 ENCOUNTER — Ambulatory Visit: Admitting: Orthopedic Surgery

## 2024-05-10 ENCOUNTER — Ambulatory Visit: Admitting: Physician Assistant
